# Patient Record
Sex: Female | Born: 1935
Health system: Southern US, Community
[De-identification: ages and names within clinical notes are randomized; demographics above are authoritative.]

## PROBLEM LIST (undated history)

## (undated) DIAGNOSIS — E039 Hypothyroidism, unspecified: Secondary | ICD-10-CM

## (undated) DIAGNOSIS — H3530 Unspecified macular degeneration: Secondary | ICD-10-CM

## (undated) DIAGNOSIS — I10 Essential (primary) hypertension: Secondary | ICD-10-CM

## (undated) DIAGNOSIS — I771 Stricture of artery: Secondary | ICD-10-CM

## (undated) DIAGNOSIS — M1990 Unspecified osteoarthritis, unspecified site: Secondary | ICD-10-CM

## (undated) DIAGNOSIS — R519 Headache, unspecified: Secondary | ICD-10-CM

## (undated) DIAGNOSIS — F419 Anxiety disorder, unspecified: Secondary | ICD-10-CM

## (undated) HISTORY — DX: Stricture of artery: I77.1

## (undated) HISTORY — PX: ABDOMINAL HYSTERECTOMY: SHX81

## (undated) HISTORY — PX: TONSILLECTOMY: SUR1361

---

## 1998-04-26 ENCOUNTER — Other Ambulatory Visit: Admission: RE | Admit: 1998-04-26 | Discharge: 1998-04-26 | Payer: Self-pay | Admitting: *Deleted

## 1999-07-11 ENCOUNTER — Other Ambulatory Visit: Admission: RE | Admit: 1999-07-11 | Discharge: 1999-07-11 | Payer: Self-pay | Admitting: *Deleted

## 2000-10-07 ENCOUNTER — Other Ambulatory Visit: Admission: RE | Admit: 2000-10-07 | Discharge: 2000-10-07 | Payer: Self-pay | Admitting: *Deleted

## 2011-06-11 DIAGNOSIS — R229 Localized swelling, mass and lump, unspecified: Secondary | ICD-10-CM | POA: Diagnosis not present

## 2011-06-14 DIAGNOSIS — R29898 Other symptoms and signs involving the musculoskeletal system: Secondary | ICD-10-CM | POA: Diagnosis not present

## 2011-10-21 DIAGNOSIS — E038 Other specified hypothyroidism: Secondary | ICD-10-CM | POA: Diagnosis not present

## 2011-10-21 DIAGNOSIS — E538 Deficiency of other specified B group vitamins: Secondary | ICD-10-CM | POA: Diagnosis not present

## 2011-10-21 DIAGNOSIS — I10 Essential (primary) hypertension: Secondary | ICD-10-CM | POA: Diagnosis not present

## 2011-10-21 DIAGNOSIS — E785 Hyperlipidemia, unspecified: Secondary | ICD-10-CM | POA: Diagnosis not present

## 2011-12-11 DIAGNOSIS — R319 Hematuria, unspecified: Secondary | ICD-10-CM | POA: Diagnosis not present

## 2011-12-11 DIAGNOSIS — N390 Urinary tract infection, site not specified: Secondary | ICD-10-CM | POA: Diagnosis not present

## 2011-12-11 DIAGNOSIS — R300 Dysuria: Secondary | ICD-10-CM | POA: Diagnosis not present

## 2011-12-27 DIAGNOSIS — R319 Hematuria, unspecified: Secondary | ICD-10-CM | POA: Diagnosis not present

## 2011-12-27 DIAGNOSIS — R300 Dysuria: Secondary | ICD-10-CM | POA: Diagnosis not present

## 2011-12-27 DIAGNOSIS — N390 Urinary tract infection, site not specified: Secondary | ICD-10-CM | POA: Diagnosis not present

## 2012-01-08 DIAGNOSIS — R300 Dysuria: Secondary | ICD-10-CM | POA: Diagnosis not present

## 2012-01-08 DIAGNOSIS — N318 Other neuromuscular dysfunction of bladder: Secondary | ICD-10-CM | POA: Diagnosis not present

## 2012-01-27 DIAGNOSIS — R310 Gross hematuria: Secondary | ICD-10-CM | POA: Insufficient documentation

## 2012-01-27 DIAGNOSIS — N3020 Other chronic cystitis without hematuria: Secondary | ICD-10-CM | POA: Diagnosis not present

## 2012-01-27 DIAGNOSIS — N3946 Mixed incontinence: Secondary | ICD-10-CM | POA: Insufficient documentation

## 2012-01-27 DIAGNOSIS — R300 Dysuria: Secondary | ICD-10-CM | POA: Diagnosis not present

## 2012-01-31 DIAGNOSIS — N390 Urinary tract infection, site not specified: Secondary | ICD-10-CM | POA: Diagnosis not present

## 2012-02-13 DIAGNOSIS — R310 Gross hematuria: Secondary | ICD-10-CM | POA: Diagnosis not present

## 2012-02-13 DIAGNOSIS — R319 Hematuria, unspecified: Secondary | ICD-10-CM | POA: Diagnosis not present

## 2012-02-24 DIAGNOSIS — G8929 Other chronic pain: Secondary | ICD-10-CM | POA: Insufficient documentation

## 2012-02-24 DIAGNOSIS — N949 Unspecified condition associated with female genital organs and menstrual cycle: Secondary | ICD-10-CM | POA: Diagnosis not present

## 2012-02-24 DIAGNOSIS — R102 Pelvic and perineal pain unspecified side: Secondary | ICD-10-CM | POA: Insufficient documentation

## 2012-02-24 DIAGNOSIS — N3946 Mixed incontinence: Secondary | ICD-10-CM | POA: Diagnosis not present

## 2012-03-02 DIAGNOSIS — N390 Urinary tract infection, site not specified: Secondary | ICD-10-CM | POA: Diagnosis not present

## 2012-03-02 DIAGNOSIS — N3946 Mixed incontinence: Secondary | ICD-10-CM | POA: Diagnosis not present

## 2012-04-08 DIAGNOSIS — Z23 Encounter for immunization: Secondary | ICD-10-CM | POA: Diagnosis not present

## 2012-04-13 DIAGNOSIS — N320 Bladder-neck obstruction: Secondary | ICD-10-CM | POA: Diagnosis not present

## 2012-04-13 DIAGNOSIS — N3941 Urge incontinence: Secondary | ICD-10-CM | POA: Diagnosis not present

## 2012-04-13 DIAGNOSIS — N3946 Mixed incontinence: Secondary | ICD-10-CM | POA: Diagnosis not present

## 2012-04-13 DIAGNOSIS — N393 Stress incontinence (female) (male): Secondary | ICD-10-CM | POA: Diagnosis not present

## 2012-06-04 DIAGNOSIS — E785 Hyperlipidemia, unspecified: Secondary | ICD-10-CM | POA: Diagnosis present

## 2012-06-04 DIAGNOSIS — F32A Depression, unspecified: Secondary | ICD-10-CM | POA: Diagnosis present

## 2012-06-04 DIAGNOSIS — Z01810 Encounter for preprocedural cardiovascular examination: Secondary | ICD-10-CM | POA: Diagnosis not present

## 2012-06-04 DIAGNOSIS — K219 Gastro-esophageal reflux disease without esophagitis: Secondary | ICD-10-CM | POA: Diagnosis present

## 2012-06-04 DIAGNOSIS — J309 Allergic rhinitis, unspecified: Secondary | ICD-10-CM | POA: Insufficient documentation

## 2012-06-04 DIAGNOSIS — I498 Other specified cardiac arrhythmias: Secondary | ICD-10-CM | POA: Diagnosis not present

## 2012-06-09 DIAGNOSIS — Z90710 Acquired absence of both cervix and uterus: Secondary | ICD-10-CM | POA: Diagnosis not present

## 2012-06-09 DIAGNOSIS — J309 Allergic rhinitis, unspecified: Secondary | ICD-10-CM | POA: Diagnosis not present

## 2012-06-09 DIAGNOSIS — Z880 Allergy status to penicillin: Secondary | ICD-10-CM | POA: Diagnosis not present

## 2012-06-09 DIAGNOSIS — R300 Dysuria: Secondary | ICD-10-CM | POA: Diagnosis not present

## 2012-06-09 DIAGNOSIS — R3915 Urgency of urination: Secondary | ICD-10-CM | POA: Diagnosis not present

## 2012-06-09 DIAGNOSIS — N3946 Mixed incontinence: Secondary | ICD-10-CM | POA: Diagnosis not present

## 2012-06-09 DIAGNOSIS — N949 Unspecified condition associated with female genital organs and menstrual cycle: Secondary | ICD-10-CM | POA: Diagnosis not present

## 2012-06-09 DIAGNOSIS — N320 Bladder-neck obstruction: Secondary | ICD-10-CM | POA: Diagnosis not present

## 2012-06-09 DIAGNOSIS — N8111 Cystocele, midline: Secondary | ICD-10-CM | POA: Diagnosis not present

## 2012-06-09 DIAGNOSIS — Z885 Allergy status to narcotic agent status: Secondary | ICD-10-CM | POA: Diagnosis not present

## 2012-06-09 DIAGNOSIS — I10 Essential (primary) hypertension: Secondary | ICD-10-CM | POA: Diagnosis not present

## 2012-06-09 DIAGNOSIS — Z7902 Long term (current) use of antithrombotics/antiplatelets: Secondary | ICD-10-CM | POA: Diagnosis not present

## 2012-06-09 DIAGNOSIS — R350 Frequency of micturition: Secondary | ICD-10-CM | POA: Diagnosis not present

## 2012-06-09 DIAGNOSIS — Z882 Allergy status to sulfonamides status: Secondary | ICD-10-CM | POA: Diagnosis not present

## 2012-06-09 DIAGNOSIS — G8929 Other chronic pain: Secondary | ICD-10-CM | POA: Diagnosis not present

## 2012-06-09 DIAGNOSIS — F411 Generalized anxiety disorder: Secondary | ICD-10-CM | POA: Diagnosis not present

## 2012-06-09 DIAGNOSIS — Z9851 Tubal ligation status: Secondary | ICD-10-CM | POA: Diagnosis not present

## 2012-06-09 DIAGNOSIS — R32 Unspecified urinary incontinence: Secondary | ICD-10-CM | POA: Diagnosis not present

## 2012-06-09 DIAGNOSIS — Z9889 Other specified postprocedural states: Secondary | ICD-10-CM | POA: Diagnosis not present

## 2012-06-09 DIAGNOSIS — F329 Major depressive disorder, single episode, unspecified: Secondary | ICD-10-CM | POA: Diagnosis not present

## 2012-06-09 DIAGNOSIS — E785 Hyperlipidemia, unspecified: Secondary | ICD-10-CM | POA: Diagnosis not present

## 2012-06-09 DIAGNOSIS — K219 Gastro-esophageal reflux disease without esophagitis: Secondary | ICD-10-CM | POA: Diagnosis not present

## 2012-06-09 DIAGNOSIS — Z87440 Personal history of urinary (tract) infections: Secondary | ICD-10-CM | POA: Diagnosis not present

## 2012-06-09 DIAGNOSIS — Z9849 Cataract extraction status, unspecified eye: Secondary | ICD-10-CM | POA: Diagnosis not present

## 2012-06-09 DIAGNOSIS — R310 Gross hematuria: Secondary | ICD-10-CM | POA: Diagnosis not present

## 2012-06-09 DIAGNOSIS — N3020 Other chronic cystitis without hematuria: Secondary | ICD-10-CM | POA: Diagnosis not present

## 2012-06-10 DIAGNOSIS — R350 Frequency of micturition: Secondary | ICD-10-CM | POA: Diagnosis not present

## 2012-06-10 DIAGNOSIS — N8111 Cystocele, midline: Secondary | ICD-10-CM | POA: Diagnosis not present

## 2012-06-10 DIAGNOSIS — N3946 Mixed incontinence: Secondary | ICD-10-CM | POA: Diagnosis not present

## 2012-06-10 DIAGNOSIS — R3915 Urgency of urination: Secondary | ICD-10-CM | POA: Diagnosis not present

## 2012-06-10 DIAGNOSIS — I10 Essential (primary) hypertension: Secondary | ICD-10-CM | POA: Diagnosis not present

## 2012-06-10 DIAGNOSIS — N320 Bladder-neck obstruction: Secondary | ICD-10-CM | POA: Diagnosis not present

## 2012-06-19 DIAGNOSIS — R300 Dysuria: Secondary | ICD-10-CM | POA: Diagnosis not present

## 2012-06-19 DIAGNOSIS — N320 Bladder-neck obstruction: Secondary | ICD-10-CM | POA: Diagnosis not present

## 2012-06-19 DIAGNOSIS — N949 Unspecified condition associated with female genital organs and menstrual cycle: Secondary | ICD-10-CM | POA: Diagnosis not present

## 2012-08-20 DIAGNOSIS — E785 Hyperlipidemia, unspecified: Secondary | ICD-10-CM | POA: Diagnosis not present

## 2012-08-20 DIAGNOSIS — I10 Essential (primary) hypertension: Secondary | ICD-10-CM | POA: Diagnosis not present

## 2012-08-20 DIAGNOSIS — Z6830 Body mass index (BMI) 30.0-30.9, adult: Secondary | ICD-10-CM | POA: Diagnosis not present

## 2012-08-20 DIAGNOSIS — E538 Deficiency of other specified B group vitamins: Secondary | ICD-10-CM | POA: Diagnosis not present

## 2012-08-20 DIAGNOSIS — E038 Other specified hypothyroidism: Secondary | ICD-10-CM | POA: Diagnosis not present

## 2012-09-14 DIAGNOSIS — D2370 Other benign neoplasm of skin of unspecified lower limb, including hip: Secondary | ICD-10-CM | POA: Diagnosis not present

## 2012-09-14 DIAGNOSIS — L719 Rosacea, unspecified: Secondary | ICD-10-CM | POA: Diagnosis not present

## 2012-09-14 DIAGNOSIS — L259 Unspecified contact dermatitis, unspecified cause: Secondary | ICD-10-CM | POA: Diagnosis not present

## 2012-09-15 DIAGNOSIS — M79609 Pain in unspecified limb: Secondary | ICD-10-CM | POA: Diagnosis not present

## 2012-09-15 DIAGNOSIS — R937 Abnormal findings on diagnostic imaging of other parts of musculoskeletal system: Secondary | ICD-10-CM | POA: Diagnosis not present

## 2012-09-30 DIAGNOSIS — R350 Frequency of micturition: Secondary | ICD-10-CM | POA: Diagnosis not present

## 2012-09-30 DIAGNOSIS — N320 Bladder-neck obstruction: Secondary | ICD-10-CM | POA: Diagnosis not present

## 2012-10-19 DIAGNOSIS — H04129 Dry eye syndrome of unspecified lacrimal gland: Secondary | ICD-10-CM | POA: Diagnosis not present

## 2012-10-19 DIAGNOSIS — H26499 Other secondary cataract, unspecified eye: Secondary | ICD-10-CM | POA: Diagnosis not present

## 2012-12-25 DIAGNOSIS — M25519 Pain in unspecified shoulder: Secondary | ICD-10-CM | POA: Diagnosis not present

## 2013-01-11 DIAGNOSIS — M719 Bursopathy, unspecified: Secondary | ICD-10-CM | POA: Diagnosis not present

## 2013-01-26 DIAGNOSIS — B370 Candidal stomatitis: Secondary | ICD-10-CM | POA: Diagnosis not present

## 2013-01-26 DIAGNOSIS — M25519 Pain in unspecified shoulder: Secondary | ICD-10-CM | POA: Diagnosis not present

## 2013-03-08 DIAGNOSIS — Z23 Encounter for immunization: Secondary | ICD-10-CM | POA: Diagnosis not present

## 2013-03-16 DIAGNOSIS — L821 Other seborrheic keratosis: Secondary | ICD-10-CM | POA: Diagnosis not present

## 2013-03-16 DIAGNOSIS — L820 Inflamed seborrheic keratosis: Secondary | ICD-10-CM | POA: Diagnosis not present

## 2013-03-16 DIAGNOSIS — D485 Neoplasm of uncertain behavior of skin: Secondary | ICD-10-CM | POA: Diagnosis not present

## 2013-03-16 DIAGNOSIS — B079 Viral wart, unspecified: Secondary | ICD-10-CM | POA: Diagnosis not present

## 2013-05-17 DIAGNOSIS — I10 Essential (primary) hypertension: Secondary | ICD-10-CM | POA: Diagnosis not present

## 2013-05-17 DIAGNOSIS — L259 Unspecified contact dermatitis, unspecified cause: Secondary | ICD-10-CM | POA: Diagnosis not present

## 2013-05-17 DIAGNOSIS — E785 Hyperlipidemia, unspecified: Secondary | ICD-10-CM | POA: Diagnosis not present

## 2013-05-17 DIAGNOSIS — E538 Deficiency of other specified B group vitamins: Secondary | ICD-10-CM | POA: Diagnosis not present

## 2013-05-19 DIAGNOSIS — I10 Essential (primary) hypertension: Secondary | ICD-10-CM | POA: Diagnosis not present

## 2013-05-19 DIAGNOSIS — E538 Deficiency of other specified B group vitamins: Secondary | ICD-10-CM | POA: Diagnosis not present

## 2013-05-19 DIAGNOSIS — E038 Other specified hypothyroidism: Secondary | ICD-10-CM | POA: Diagnosis not present

## 2013-05-19 DIAGNOSIS — E785 Hyperlipidemia, unspecified: Secondary | ICD-10-CM | POA: Diagnosis not present

## 2013-07-14 DIAGNOSIS — R059 Cough, unspecified: Secondary | ICD-10-CM | POA: Diagnosis not present

## 2013-07-14 DIAGNOSIS — R05 Cough: Secondary | ICD-10-CM | POA: Diagnosis not present

## 2013-07-14 DIAGNOSIS — Z6830 Body mass index (BMI) 30.0-30.9, adult: Secondary | ICD-10-CM | POA: Diagnosis not present

## 2013-07-14 DIAGNOSIS — J069 Acute upper respiratory infection, unspecified: Secondary | ICD-10-CM | POA: Diagnosis not present

## 2013-10-04 DIAGNOSIS — B029 Zoster without complications: Secondary | ICD-10-CM | POA: Diagnosis not present

## 2013-10-04 DIAGNOSIS — Z79899 Other long term (current) drug therapy: Secondary | ICD-10-CM | POA: Diagnosis not present

## 2013-10-20 DIAGNOSIS — M25519 Pain in unspecified shoulder: Secondary | ICD-10-CM | POA: Diagnosis not present

## 2013-11-02 DIAGNOSIS — M25819 Other specified joint disorders, unspecified shoulder: Secondary | ICD-10-CM | POA: Diagnosis not present

## 2013-11-09 DIAGNOSIS — M25819 Other specified joint disorders, unspecified shoulder: Secondary | ICD-10-CM | POA: Diagnosis not present

## 2013-11-15 DIAGNOSIS — I10 Essential (primary) hypertension: Secondary | ICD-10-CM | POA: Diagnosis not present

## 2013-11-15 DIAGNOSIS — R5383 Other fatigue: Secondary | ICD-10-CM | POA: Diagnosis not present

## 2013-11-15 DIAGNOSIS — Z79899 Other long term (current) drug therapy: Secondary | ICD-10-CM | POA: Diagnosis not present

## 2013-11-15 DIAGNOSIS — E785 Hyperlipidemia, unspecified: Secondary | ICD-10-CM | POA: Diagnosis not present

## 2013-11-15 DIAGNOSIS — R5381 Other malaise: Secondary | ICD-10-CM | POA: Diagnosis not present

## 2013-11-15 DIAGNOSIS — E538 Deficiency of other specified B group vitamins: Secondary | ICD-10-CM | POA: Diagnosis not present

## 2013-11-16 DIAGNOSIS — M25819 Other specified joint disorders, unspecified shoulder: Secondary | ICD-10-CM | POA: Diagnosis not present

## 2013-11-18 DIAGNOSIS — M25819 Other specified joint disorders, unspecified shoulder: Secondary | ICD-10-CM | POA: Diagnosis not present

## 2013-11-23 DIAGNOSIS — M25819 Other specified joint disorders, unspecified shoulder: Secondary | ICD-10-CM | POA: Diagnosis not present

## 2013-11-25 DIAGNOSIS — M25819 Other specified joint disorders, unspecified shoulder: Secondary | ICD-10-CM | POA: Diagnosis not present

## 2013-12-02 DIAGNOSIS — M25819 Other specified joint disorders, unspecified shoulder: Secondary | ICD-10-CM | POA: Diagnosis not present

## 2013-12-09 DIAGNOSIS — M25819 Other specified joint disorders, unspecified shoulder: Secondary | ICD-10-CM | POA: Diagnosis not present

## 2013-12-16 DIAGNOSIS — M25819 Other specified joint disorders, unspecified shoulder: Secondary | ICD-10-CM | POA: Diagnosis not present

## 2014-03-07 DIAGNOSIS — L821 Other seborrheic keratosis: Secondary | ICD-10-CM | POA: Diagnosis not present

## 2014-03-07 DIAGNOSIS — L723 Sebaceous cyst: Secondary | ICD-10-CM | POA: Diagnosis not present

## 2014-03-07 DIAGNOSIS — D1801 Hemangioma of skin and subcutaneous tissue: Secondary | ICD-10-CM | POA: Diagnosis not present

## 2014-03-07 DIAGNOSIS — D229 Melanocytic nevi, unspecified: Secondary | ICD-10-CM | POA: Diagnosis not present

## 2014-04-21 DIAGNOSIS — Z23 Encounter for immunization: Secondary | ICD-10-CM | POA: Diagnosis not present

## 2014-05-30 DIAGNOSIS — G44209 Tension-type headache, unspecified, not intractable: Secondary | ICD-10-CM | POA: Diagnosis not present

## 2014-06-01 DIAGNOSIS — H04123 Dry eye syndrome of bilateral lacrimal glands: Secondary | ICD-10-CM | POA: Diagnosis not present

## 2014-06-01 DIAGNOSIS — H26493 Other secondary cataract, bilateral: Secondary | ICD-10-CM | POA: Diagnosis not present

## 2014-09-01 DIAGNOSIS — Z6830 Body mass index (BMI) 30.0-30.9, adult: Secondary | ICD-10-CM | POA: Diagnosis not present

## 2014-09-01 DIAGNOSIS — F329 Major depressive disorder, single episode, unspecified: Secondary | ICD-10-CM | POA: Diagnosis not present

## 2014-09-01 DIAGNOSIS — J45909 Unspecified asthma, uncomplicated: Secondary | ICD-10-CM | POA: Diagnosis not present

## 2015-01-11 DIAGNOSIS — E785 Hyperlipidemia, unspecified: Secondary | ICD-10-CM | POA: Diagnosis not present

## 2015-01-11 DIAGNOSIS — F329 Major depressive disorder, single episode, unspecified: Secondary | ICD-10-CM | POA: Diagnosis not present

## 2015-01-11 DIAGNOSIS — Z9181 History of falling: Secondary | ICD-10-CM | POA: Diagnosis not present

## 2015-01-11 DIAGNOSIS — Z6830 Body mass index (BMI) 30.0-30.9, adult: Secondary | ICD-10-CM | POA: Diagnosis not present

## 2015-01-11 DIAGNOSIS — E038 Other specified hypothyroidism: Secondary | ICD-10-CM | POA: Diagnosis not present

## 2015-01-11 DIAGNOSIS — J45909 Unspecified asthma, uncomplicated: Secondary | ICD-10-CM | POA: Diagnosis not present

## 2015-01-11 DIAGNOSIS — I10 Essential (primary) hypertension: Secondary | ICD-10-CM | POA: Diagnosis not present

## 2015-01-11 DIAGNOSIS — Z23 Encounter for immunization: Secondary | ICD-10-CM | POA: Diagnosis not present

## 2015-03-26 DIAGNOSIS — Z23 Encounter for immunization: Secondary | ICD-10-CM | POA: Diagnosis not present

## 2015-03-30 DIAGNOSIS — R1030 Lower abdominal pain, unspecified: Secondary | ICD-10-CM | POA: Diagnosis not present

## 2015-03-30 DIAGNOSIS — Z6830 Body mass index (BMI) 30.0-30.9, adult: Secondary | ICD-10-CM | POA: Diagnosis not present

## 2015-03-30 DIAGNOSIS — Z79899 Other long term (current) drug therapy: Secondary | ICD-10-CM | POA: Diagnosis not present

## 2015-04-04 DIAGNOSIS — R1030 Lower abdominal pain, unspecified: Secondary | ICD-10-CM | POA: Diagnosis not present

## 2015-04-05 DIAGNOSIS — L821 Other seborrheic keratosis: Secondary | ICD-10-CM | POA: Diagnosis not present

## 2015-04-05 DIAGNOSIS — T63421A Toxic effect of venom of ants, accidental (unintentional), initial encounter: Secondary | ICD-10-CM | POA: Diagnosis not present

## 2015-04-05 DIAGNOSIS — D225 Melanocytic nevi of trunk: Secondary | ICD-10-CM | POA: Diagnosis not present

## 2015-04-05 DIAGNOSIS — L723 Sebaceous cyst: Secondary | ICD-10-CM | POA: Diagnosis not present

## 2015-04-10 DIAGNOSIS — R1030 Lower abdominal pain, unspecified: Secondary | ICD-10-CM | POA: Diagnosis not present

## 2015-04-10 DIAGNOSIS — Z0000 Encounter for general adult medical examination without abnormal findings: Secondary | ICD-10-CM | POA: Diagnosis not present

## 2015-04-13 DIAGNOSIS — R102 Pelvic and perineal pain: Secondary | ICD-10-CM | POA: Diagnosis not present

## 2015-04-13 DIAGNOSIS — M25552 Pain in left hip: Secondary | ICD-10-CM | POA: Diagnosis not present

## 2015-04-13 DIAGNOSIS — M160 Bilateral primary osteoarthritis of hip: Secondary | ICD-10-CM | POA: Diagnosis not present

## 2015-05-15 DIAGNOSIS — M25511 Pain in right shoulder: Secondary | ICD-10-CM | POA: Diagnosis not present

## 2015-05-15 DIAGNOSIS — M19011 Primary osteoarthritis, right shoulder: Secondary | ICD-10-CM | POA: Diagnosis not present

## 2015-08-11 DIAGNOSIS — R109 Unspecified abdominal pain: Secondary | ICD-10-CM | POA: Diagnosis not present

## 2015-08-11 DIAGNOSIS — R1012 Left upper quadrant pain: Secondary | ICD-10-CM | POA: Diagnosis not present

## 2015-08-11 DIAGNOSIS — K2900 Acute gastritis without bleeding: Secondary | ICD-10-CM | POA: Diagnosis not present

## 2015-08-21 DIAGNOSIS — E669 Obesity, unspecified: Secondary | ICD-10-CM | POA: Diagnosis not present

## 2015-08-21 DIAGNOSIS — E038 Other specified hypothyroidism: Secondary | ICD-10-CM | POA: Diagnosis not present

## 2015-08-21 DIAGNOSIS — F411 Generalized anxiety disorder: Secondary | ICD-10-CM | POA: Diagnosis not present

## 2015-08-21 DIAGNOSIS — F329 Major depressive disorder, single episode, unspecified: Secondary | ICD-10-CM | POA: Diagnosis not present

## 2015-08-21 DIAGNOSIS — Z1389 Encounter for screening for other disorder: Secondary | ICD-10-CM | POA: Diagnosis not present

## 2015-08-21 DIAGNOSIS — I10 Essential (primary) hypertension: Secondary | ICD-10-CM | POA: Diagnosis not present

## 2015-08-21 DIAGNOSIS — Z6830 Body mass index (BMI) 30.0-30.9, adult: Secondary | ICD-10-CM | POA: Diagnosis not present

## 2015-08-21 DIAGNOSIS — E785 Hyperlipidemia, unspecified: Secondary | ICD-10-CM | POA: Diagnosis not present

## 2015-08-21 DIAGNOSIS — Z79899 Other long term (current) drug therapy: Secondary | ICD-10-CM | POA: Diagnosis not present

## 2015-08-30 DIAGNOSIS — M19011 Primary osteoarthritis, right shoulder: Secondary | ICD-10-CM | POA: Diagnosis not present

## 2015-08-30 DIAGNOSIS — M25511 Pain in right shoulder: Secondary | ICD-10-CM | POA: Diagnosis not present

## 2015-09-19 DIAGNOSIS — I10 Essential (primary) hypertension: Secondary | ICD-10-CM | POA: Diagnosis not present

## 2015-09-19 DIAGNOSIS — Z6827 Body mass index (BMI) 27.0-27.9, adult: Secondary | ICD-10-CM | POA: Diagnosis not present

## 2015-09-19 DIAGNOSIS — Z79899 Other long term (current) drug therapy: Secondary | ICD-10-CM | POA: Diagnosis not present

## 2015-11-28 DIAGNOSIS — L820 Inflamed seborrheic keratosis: Secondary | ICD-10-CM | POA: Diagnosis not present

## 2015-11-28 DIAGNOSIS — D1801 Hemangioma of skin and subcutaneous tissue: Secondary | ICD-10-CM | POA: Diagnosis not present

## 2015-11-28 DIAGNOSIS — L853 Xerosis cutis: Secondary | ICD-10-CM | POA: Diagnosis not present

## 2015-12-10 DIAGNOSIS — S60465A Insect bite (nonvenomous) of left ring finger, initial encounter: Secondary | ICD-10-CM | POA: Diagnosis not present

## 2015-12-13 DIAGNOSIS — Z79899 Other long term (current) drug therapy: Secondary | ICD-10-CM | POA: Diagnosis not present

## 2015-12-13 DIAGNOSIS — F329 Major depressive disorder, single episode, unspecified: Secondary | ICD-10-CM | POA: Diagnosis not present

## 2015-12-13 DIAGNOSIS — Z6827 Body mass index (BMI) 27.0-27.9, adult: Secondary | ICD-10-CM | POA: Diagnosis not present

## 2015-12-13 DIAGNOSIS — E038 Other specified hypothyroidism: Secondary | ICD-10-CM | POA: Diagnosis not present

## 2015-12-13 DIAGNOSIS — I10 Essential (primary) hypertension: Secondary | ICD-10-CM | POA: Diagnosis not present

## 2015-12-13 DIAGNOSIS — M159 Polyosteoarthritis, unspecified: Secondary | ICD-10-CM | POA: Diagnosis not present

## 2015-12-13 DIAGNOSIS — E785 Hyperlipidemia, unspecified: Secondary | ICD-10-CM | POA: Diagnosis not present

## 2015-12-27 DIAGNOSIS — M19011 Primary osteoarthritis, right shoulder: Secondary | ICD-10-CM | POA: Diagnosis not present

## 2015-12-27 DIAGNOSIS — M25511 Pain in right shoulder: Secondary | ICD-10-CM | POA: Diagnosis not present

## 2016-02-07 DIAGNOSIS — Z6830 Body mass index (BMI) 30.0-30.9, adult: Secondary | ICD-10-CM | POA: Diagnosis not present

## 2016-02-07 DIAGNOSIS — R350 Frequency of micturition: Secondary | ICD-10-CM | POA: Diagnosis not present

## 2016-02-07 DIAGNOSIS — N3281 Overactive bladder: Secondary | ICD-10-CM | POA: Diagnosis not present

## 2016-02-07 DIAGNOSIS — Z9181 History of falling: Secondary | ICD-10-CM | POA: Diagnosis not present

## 2016-03-19 DIAGNOSIS — H26493 Other secondary cataract, bilateral: Secondary | ICD-10-CM | POA: Diagnosis not present

## 2016-03-19 DIAGNOSIS — G43109 Migraine with aura, not intractable, without status migrainosus: Secondary | ICD-10-CM | POA: Diagnosis not present

## 2016-03-19 DIAGNOSIS — H04123 Dry eye syndrome of bilateral lacrimal glands: Secondary | ICD-10-CM | POA: Diagnosis not present

## 2016-04-04 DIAGNOSIS — Z23 Encounter for immunization: Secondary | ICD-10-CM | POA: Diagnosis not present

## 2016-05-23 DIAGNOSIS — Z6830 Body mass index (BMI) 30.0-30.9, adult: Secondary | ICD-10-CM | POA: Diagnosis not present

## 2016-05-23 DIAGNOSIS — M79605 Pain in left leg: Secondary | ICD-10-CM | POA: Diagnosis not present

## 2016-05-30 DIAGNOSIS — M79605 Pain in left leg: Secondary | ICD-10-CM | POA: Diagnosis not present

## 2016-05-30 DIAGNOSIS — M79662 Pain in left lower leg: Secondary | ICD-10-CM | POA: Diagnosis not present

## 2016-06-17 DIAGNOSIS — M19011 Primary osteoarthritis, right shoulder: Secondary | ICD-10-CM | POA: Diagnosis not present

## 2016-06-17 DIAGNOSIS — M25511 Pain in right shoulder: Secondary | ICD-10-CM | POA: Diagnosis not present

## 2016-10-03 DIAGNOSIS — Z6830 Body mass index (BMI) 30.0-30.9, adult: Secondary | ICD-10-CM | POA: Diagnosis not present

## 2016-10-03 DIAGNOSIS — E785 Hyperlipidemia, unspecified: Secondary | ICD-10-CM | POA: Diagnosis not present

## 2016-10-03 DIAGNOSIS — E063 Autoimmune thyroiditis: Secondary | ICD-10-CM | POA: Diagnosis not present

## 2016-10-03 DIAGNOSIS — F411 Generalized anxiety disorder: Secondary | ICD-10-CM | POA: Diagnosis not present

## 2016-10-03 DIAGNOSIS — N3281 Overactive bladder: Secondary | ICD-10-CM | POA: Diagnosis not present

## 2016-10-03 DIAGNOSIS — F341 Dysthymic disorder: Secondary | ICD-10-CM | POA: Diagnosis not present

## 2016-10-03 DIAGNOSIS — I10 Essential (primary) hypertension: Secondary | ICD-10-CM | POA: Diagnosis not present

## 2016-10-03 DIAGNOSIS — Z79899 Other long term (current) drug therapy: Secondary | ICD-10-CM | POA: Diagnosis not present

## 2016-10-03 DIAGNOSIS — Z23 Encounter for immunization: Secondary | ICD-10-CM | POA: Diagnosis not present

## 2016-10-14 DIAGNOSIS — L700 Acne vulgaris: Secondary | ICD-10-CM | POA: Diagnosis not present

## 2016-10-14 DIAGNOSIS — L578 Other skin changes due to chronic exposure to nonionizing radiation: Secondary | ICD-10-CM | POA: Diagnosis not present

## 2016-10-21 DIAGNOSIS — E669 Obesity, unspecified: Secondary | ICD-10-CM | POA: Diagnosis not present

## 2016-10-21 DIAGNOSIS — M159 Polyosteoarthritis, unspecified: Secondary | ICD-10-CM | POA: Diagnosis not present

## 2016-10-21 DIAGNOSIS — Z79899 Other long term (current) drug therapy: Secondary | ICD-10-CM | POA: Diagnosis not present

## 2016-10-21 DIAGNOSIS — Z6830 Body mass index (BMI) 30.0-30.9, adult: Secondary | ICD-10-CM | POA: Diagnosis not present

## 2016-11-08 DIAGNOSIS — M25511 Pain in right shoulder: Secondary | ICD-10-CM | POA: Diagnosis not present

## 2017-01-22 DIAGNOSIS — Z0000 Encounter for general adult medical examination without abnormal findings: Secondary | ICD-10-CM | POA: Diagnosis not present

## 2017-01-22 DIAGNOSIS — Z136 Encounter for screening for cardiovascular disorders: Secondary | ICD-10-CM | POA: Diagnosis not present

## 2017-01-22 DIAGNOSIS — Z9181 History of falling: Secondary | ICD-10-CM | POA: Diagnosis not present

## 2017-01-22 DIAGNOSIS — Z6831 Body mass index (BMI) 31.0-31.9, adult: Secondary | ICD-10-CM | POA: Diagnosis not present

## 2017-01-22 DIAGNOSIS — E063 Autoimmune thyroiditis: Secondary | ICD-10-CM | POA: Diagnosis not present

## 2017-01-22 DIAGNOSIS — Z1231 Encounter for screening mammogram for malignant neoplasm of breast: Secondary | ICD-10-CM | POA: Diagnosis not present

## 2017-01-22 DIAGNOSIS — N959 Unspecified menopausal and perimenopausal disorder: Secondary | ICD-10-CM | POA: Diagnosis not present

## 2017-01-22 DIAGNOSIS — E669 Obesity, unspecified: Secondary | ICD-10-CM | POA: Diagnosis not present

## 2017-01-22 DIAGNOSIS — E785 Hyperlipidemia, unspecified: Secondary | ICD-10-CM | POA: Diagnosis not present

## 2017-01-22 DIAGNOSIS — Z1389 Encounter for screening for other disorder: Secondary | ICD-10-CM | POA: Diagnosis not present

## 2017-02-11 DIAGNOSIS — M19011 Primary osteoarthritis, right shoulder: Secondary | ICD-10-CM | POA: Diagnosis not present

## 2017-02-11 DIAGNOSIS — M25511 Pain in right shoulder: Secondary | ICD-10-CM | POA: Diagnosis not present

## 2017-02-25 DIAGNOSIS — N3941 Urge incontinence: Secondary | ICD-10-CM | POA: Diagnosis not present

## 2017-02-25 DIAGNOSIS — N390 Urinary tract infection, site not specified: Secondary | ICD-10-CM | POA: Diagnosis not present

## 2017-03-09 DIAGNOSIS — Z23 Encounter for immunization: Secondary | ICD-10-CM | POA: Diagnosis not present

## 2017-03-26 DIAGNOSIS — H26493 Other secondary cataract, bilateral: Secondary | ICD-10-CM | POA: Diagnosis not present

## 2017-04-11 DIAGNOSIS — M7061 Trochanteric bursitis, right hip: Secondary | ICD-10-CM | POA: Diagnosis not present

## 2017-04-17 DIAGNOSIS — R3989 Other symptoms and signs involving the genitourinary system: Secondary | ICD-10-CM | POA: Diagnosis not present

## 2017-04-17 DIAGNOSIS — E669 Obesity, unspecified: Secondary | ICD-10-CM | POA: Diagnosis not present

## 2017-05-02 DIAGNOSIS — R5381 Other malaise: Secondary | ICD-10-CM | POA: Diagnosis present

## 2017-05-02 DIAGNOSIS — Z9181 History of falling: Secondary | ICD-10-CM | POA: Diagnosis not present

## 2017-05-02 DIAGNOSIS — F418 Other specified anxiety disorders: Secondary | ICD-10-CM | POA: Diagnosis present

## 2017-05-02 DIAGNOSIS — I10 Essential (primary) hypertension: Secondary | ICD-10-CM | POA: Diagnosis not present

## 2017-05-02 DIAGNOSIS — R0602 Shortness of breath: Secondary | ICD-10-CM | POA: Diagnosis not present

## 2017-05-02 DIAGNOSIS — E861 Hypovolemia: Secondary | ICD-10-CM | POA: Diagnosis not present

## 2017-05-02 DIAGNOSIS — R509 Fever, unspecified: Secondary | ICD-10-CM | POA: Diagnosis not present

## 2017-05-02 DIAGNOSIS — F329 Major depressive disorder, single episode, unspecified: Secondary | ICD-10-CM | POA: Diagnosis not present

## 2017-05-02 DIAGNOSIS — B9620 Unspecified Escherichia coli [E. coli] as the cause of diseases classified elsewhere: Secondary | ICD-10-CM | POA: Diagnosis present

## 2017-05-02 DIAGNOSIS — E860 Dehydration: Secondary | ICD-10-CM | POA: Diagnosis not present

## 2017-05-02 DIAGNOSIS — M1712 Unilateral primary osteoarthritis, left knee: Secondary | ICD-10-CM | POA: Diagnosis not present

## 2017-05-02 DIAGNOSIS — E876 Hypokalemia: Secondary | ICD-10-CM | POA: Diagnosis not present

## 2017-05-02 DIAGNOSIS — G9349 Other encephalopathy: Secondary | ICD-10-CM | POA: Diagnosis not present

## 2017-05-02 DIAGNOSIS — R1110 Vomiting, unspecified: Secondary | ICD-10-CM | POA: Diagnosis not present

## 2017-05-02 DIAGNOSIS — R0902 Hypoxemia: Secondary | ICD-10-CM | POA: Diagnosis not present

## 2017-05-02 DIAGNOSIS — R197 Diarrhea, unspecified: Secondary | ICD-10-CM | POA: Diagnosis not present

## 2017-05-02 DIAGNOSIS — M25562 Pain in left knee: Secondary | ICD-10-CM | POA: Diagnosis present

## 2017-05-02 DIAGNOSIS — N10 Acute tubulo-interstitial nephritis: Secondary | ICD-10-CM | POA: Diagnosis not present

## 2017-05-02 DIAGNOSIS — R112 Nausea with vomiting, unspecified: Secondary | ICD-10-CM | POA: Diagnosis not present

## 2017-05-02 DIAGNOSIS — R531 Weakness: Secondary | ICD-10-CM | POA: Diagnosis not present

## 2017-05-02 DIAGNOSIS — A419 Sepsis, unspecified organism: Secondary | ICD-10-CM | POA: Diagnosis present

## 2017-05-02 DIAGNOSIS — R404 Transient alteration of awareness: Secondary | ICD-10-CM | POA: Diagnosis not present

## 2017-05-07 DIAGNOSIS — R0602 Shortness of breath: Secondary | ICD-10-CM

## 2017-05-09 DIAGNOSIS — Z791 Long term (current) use of non-steroidal anti-inflammatories (NSAID): Secondary | ICD-10-CM | POA: Diagnosis not present

## 2017-05-09 DIAGNOSIS — I10 Essential (primary) hypertension: Secondary | ICD-10-CM | POA: Diagnosis not present

## 2017-05-09 DIAGNOSIS — R296 Repeated falls: Secondary | ICD-10-CM | POA: Diagnosis not present

## 2017-05-09 DIAGNOSIS — F418 Other specified anxiety disorders: Secondary | ICD-10-CM | POA: Diagnosis not present

## 2017-05-09 DIAGNOSIS — B9620 Unspecified Escherichia coli [E. coli] as the cause of diseases classified elsewhere: Secondary | ICD-10-CM | POA: Diagnosis not present

## 2017-05-09 DIAGNOSIS — N10 Acute tubulo-interstitial nephritis: Secondary | ICD-10-CM | POA: Diagnosis not present

## 2017-05-09 DIAGNOSIS — Z602 Problems related to living alone: Secondary | ICD-10-CM | POA: Diagnosis not present

## 2017-05-09 DIAGNOSIS — M25562 Pain in left knee: Secondary | ICD-10-CM | POA: Diagnosis not present

## 2017-05-16 DIAGNOSIS — R296 Repeated falls: Secondary | ICD-10-CM | POA: Diagnosis not present

## 2017-05-16 DIAGNOSIS — F418 Other specified anxiety disorders: Secondary | ICD-10-CM | POA: Diagnosis not present

## 2017-05-16 DIAGNOSIS — N10 Acute tubulo-interstitial nephritis: Secondary | ICD-10-CM | POA: Diagnosis not present

## 2017-05-16 DIAGNOSIS — B9620 Unspecified Escherichia coli [E. coli] as the cause of diseases classified elsewhere: Secondary | ICD-10-CM | POA: Diagnosis not present

## 2017-05-16 DIAGNOSIS — I10 Essential (primary) hypertension: Secondary | ICD-10-CM | POA: Diagnosis not present

## 2017-05-16 DIAGNOSIS — M25562 Pain in left knee: Secondary | ICD-10-CM | POA: Diagnosis not present

## 2017-05-19 DIAGNOSIS — R296 Repeated falls: Secondary | ICD-10-CM | POA: Diagnosis not present

## 2017-05-19 DIAGNOSIS — F418 Other specified anxiety disorders: Secondary | ICD-10-CM | POA: Diagnosis not present

## 2017-05-19 DIAGNOSIS — B9620 Unspecified Escherichia coli [E. coli] as the cause of diseases classified elsewhere: Secondary | ICD-10-CM | POA: Diagnosis not present

## 2017-05-19 DIAGNOSIS — N10 Acute tubulo-interstitial nephritis: Secondary | ICD-10-CM | POA: Diagnosis not present

## 2017-05-19 DIAGNOSIS — M25562 Pain in left knee: Secondary | ICD-10-CM | POA: Diagnosis not present

## 2017-05-19 DIAGNOSIS — I10 Essential (primary) hypertension: Secondary | ICD-10-CM | POA: Diagnosis not present

## 2017-05-21 DIAGNOSIS — N10 Acute tubulo-interstitial nephritis: Secondary | ICD-10-CM | POA: Diagnosis not present

## 2017-05-21 DIAGNOSIS — F418 Other specified anxiety disorders: Secondary | ICD-10-CM | POA: Diagnosis not present

## 2017-05-21 DIAGNOSIS — M25562 Pain in left knee: Secondary | ICD-10-CM | POA: Diagnosis not present

## 2017-05-21 DIAGNOSIS — B9620 Unspecified Escherichia coli [E. coli] as the cause of diseases classified elsewhere: Secondary | ICD-10-CM | POA: Diagnosis not present

## 2017-05-21 DIAGNOSIS — R296 Repeated falls: Secondary | ICD-10-CM | POA: Diagnosis not present

## 2017-05-21 DIAGNOSIS — I10 Essential (primary) hypertension: Secondary | ICD-10-CM | POA: Diagnosis not present

## 2017-05-23 DIAGNOSIS — R296 Repeated falls: Secondary | ICD-10-CM | POA: Diagnosis not present

## 2017-05-23 DIAGNOSIS — N10 Acute tubulo-interstitial nephritis: Secondary | ICD-10-CM | POA: Diagnosis not present

## 2017-05-23 DIAGNOSIS — F418 Other specified anxiety disorders: Secondary | ICD-10-CM | POA: Diagnosis not present

## 2017-05-23 DIAGNOSIS — M25562 Pain in left knee: Secondary | ICD-10-CM | POA: Diagnosis not present

## 2017-05-23 DIAGNOSIS — I10 Essential (primary) hypertension: Secondary | ICD-10-CM | POA: Diagnosis not present

## 2017-05-23 DIAGNOSIS — B9620 Unspecified Escherichia coli [E. coli] as the cause of diseases classified elsewhere: Secondary | ICD-10-CM | POA: Diagnosis not present

## 2017-06-20 DIAGNOSIS — M25511 Pain in right shoulder: Secondary | ICD-10-CM | POA: Diagnosis not present

## 2017-06-20 DIAGNOSIS — M19011 Primary osteoarthritis, right shoulder: Secondary | ICD-10-CM | POA: Diagnosis not present

## 2017-06-30 DIAGNOSIS — M19012 Primary osteoarthritis, left shoulder: Secondary | ICD-10-CM | POA: Diagnosis not present

## 2017-06-30 DIAGNOSIS — M25512 Pain in left shoulder: Secondary | ICD-10-CM | POA: Diagnosis not present

## 2017-07-22 DIAGNOSIS — I10 Essential (primary) hypertension: Secondary | ICD-10-CM | POA: Diagnosis not present

## 2017-07-22 DIAGNOSIS — Z79899 Other long term (current) drug therapy: Secondary | ICD-10-CM | POA: Diagnosis not present

## 2017-07-22 DIAGNOSIS — E785 Hyperlipidemia, unspecified: Secondary | ICD-10-CM | POA: Diagnosis not present

## 2017-07-22 DIAGNOSIS — F411 Generalized anxiety disorder: Secondary | ICD-10-CM | POA: Diagnosis not present

## 2017-07-22 DIAGNOSIS — E063 Autoimmune thyroiditis: Secondary | ICD-10-CM | POA: Diagnosis not present

## 2017-08-01 DIAGNOSIS — M62838 Other muscle spasm: Secondary | ICD-10-CM | POA: Diagnosis not present

## 2017-08-20 DIAGNOSIS — N189 Chronic kidney disease, unspecified: Secondary | ICD-10-CM | POA: Diagnosis not present

## 2017-08-22 ENCOUNTER — Other Ambulatory Visit: Payer: Self-pay | Admitting: Nephrology

## 2017-08-22 DIAGNOSIS — N12 Tubulo-interstitial nephritis, not specified as acute or chronic: Secondary | ICD-10-CM

## 2017-08-27 DIAGNOSIS — N12 Tubulo-interstitial nephritis, not specified as acute or chronic: Secondary | ICD-10-CM | POA: Diagnosis not present

## 2017-10-06 DIAGNOSIS — M25512 Pain in left shoulder: Secondary | ICD-10-CM | POA: Diagnosis not present

## 2017-10-06 DIAGNOSIS — M19012 Primary osteoarthritis, left shoulder: Secondary | ICD-10-CM | POA: Diagnosis not present

## 2017-10-13 DIAGNOSIS — M19011 Primary osteoarthritis, right shoulder: Secondary | ICD-10-CM | POA: Diagnosis not present

## 2017-10-13 DIAGNOSIS — M25511 Pain in right shoulder: Secondary | ICD-10-CM | POA: Diagnosis not present

## 2017-10-29 DIAGNOSIS — R351 Nocturia: Secondary | ICD-10-CM | POA: Diagnosis not present

## 2017-10-29 DIAGNOSIS — N3941 Urge incontinence: Secondary | ICD-10-CM | POA: Diagnosis not present

## 2017-10-29 DIAGNOSIS — R350 Frequency of micturition: Secondary | ICD-10-CM | POA: Diagnosis not present

## 2017-11-27 DIAGNOSIS — S0006XA Insect bite (nonvenomous) of scalp, initial encounter: Secondary | ICD-10-CM | POA: Diagnosis not present

## 2017-12-12 DIAGNOSIS — N3941 Urge incontinence: Secondary | ICD-10-CM | POA: Diagnosis not present

## 2018-01-13 DIAGNOSIS — M19011 Primary osteoarthritis, right shoulder: Secondary | ICD-10-CM | POA: Diagnosis not present

## 2018-01-13 DIAGNOSIS — M25511 Pain in right shoulder: Secondary | ICD-10-CM | POA: Diagnosis not present

## 2018-01-20 DIAGNOSIS — N3941 Urge incontinence: Secondary | ICD-10-CM | POA: Diagnosis not present

## 2018-02-05 DIAGNOSIS — I10 Essential (primary) hypertension: Secondary | ICD-10-CM | POA: Diagnosis not present

## 2018-02-05 DIAGNOSIS — E785 Hyperlipidemia, unspecified: Secondary | ICD-10-CM | POA: Diagnosis not present

## 2018-02-05 DIAGNOSIS — E063 Autoimmune thyroiditis: Secondary | ICD-10-CM | POA: Diagnosis not present

## 2018-02-05 DIAGNOSIS — Z1339 Encounter for screening examination for other mental health and behavioral disorders: Secondary | ICD-10-CM | POA: Diagnosis not present

## 2018-02-05 DIAGNOSIS — M159 Polyosteoarthritis, unspecified: Secondary | ICD-10-CM | POA: Diagnosis not present

## 2018-02-24 DIAGNOSIS — R300 Dysuria: Secondary | ICD-10-CM | POA: Diagnosis not present

## 2018-02-24 DIAGNOSIS — N3941 Urge incontinence: Secondary | ICD-10-CM | POA: Diagnosis not present

## 2018-03-13 DIAGNOSIS — N390 Urinary tract infection, site not specified: Secondary | ICD-10-CM | POA: Diagnosis not present

## 2018-03-13 DIAGNOSIS — N3941 Urge incontinence: Secondary | ICD-10-CM | POA: Diagnosis not present

## 2018-03-23 DIAGNOSIS — N3941 Urge incontinence: Secondary | ICD-10-CM | POA: Diagnosis not present

## 2018-03-25 DIAGNOSIS — M19012 Primary osteoarthritis, left shoulder: Secondary | ICD-10-CM | POA: Diagnosis not present

## 2018-03-25 DIAGNOSIS — M25512 Pain in left shoulder: Secondary | ICD-10-CM | POA: Diagnosis not present

## 2018-03-26 DIAGNOSIS — Z23 Encounter for immunization: Secondary | ICD-10-CM | POA: Diagnosis not present

## 2018-04-06 DIAGNOSIS — L578 Other skin changes due to chronic exposure to nonionizing radiation: Secondary | ICD-10-CM | POA: Diagnosis not present

## 2018-04-06 DIAGNOSIS — L820 Inflamed seborrheic keratosis: Secondary | ICD-10-CM | POA: Diagnosis not present

## 2018-04-06 DIAGNOSIS — C44629 Squamous cell carcinoma of skin of left upper limb, including shoulder: Secondary | ICD-10-CM | POA: Diagnosis not present

## 2018-04-06 DIAGNOSIS — L821 Other seborrheic keratosis: Secondary | ICD-10-CM | POA: Diagnosis not present

## 2018-04-09 DIAGNOSIS — Z6831 Body mass index (BMI) 31.0-31.9, adult: Secondary | ICD-10-CM | POA: Diagnosis not present

## 2018-04-09 DIAGNOSIS — E063 Autoimmune thyroiditis: Secondary | ICD-10-CM | POA: Diagnosis not present

## 2018-04-09 DIAGNOSIS — R1031 Right lower quadrant pain: Secondary | ICD-10-CM | POA: Diagnosis not present

## 2018-04-09 DIAGNOSIS — Z79899 Other long term (current) drug therapy: Secondary | ICD-10-CM | POA: Diagnosis not present

## 2018-04-17 DIAGNOSIS — R590 Localized enlarged lymph nodes: Secondary | ICD-10-CM | POA: Diagnosis not present

## 2018-04-17 DIAGNOSIS — R1031 Right lower quadrant pain: Secondary | ICD-10-CM | POA: Diagnosis not present

## 2018-04-22 ENCOUNTER — Other Ambulatory Visit: Payer: Self-pay

## 2018-04-26 DIAGNOSIS — N3000 Acute cystitis without hematuria: Secondary | ICD-10-CM | POA: Diagnosis not present

## 2018-04-26 DIAGNOSIS — R110 Nausea: Secondary | ICD-10-CM | POA: Diagnosis not present

## 2018-04-26 DIAGNOSIS — R509 Fever, unspecified: Secondary | ICD-10-CM | POA: Diagnosis not present

## 2018-04-26 DIAGNOSIS — J069 Acute upper respiratory infection, unspecified: Secondary | ICD-10-CM | POA: Diagnosis not present

## 2018-05-04 DIAGNOSIS — N3941 Urge incontinence: Secondary | ICD-10-CM | POA: Diagnosis not present

## 2018-05-04 DIAGNOSIS — N3000 Acute cystitis without hematuria: Secondary | ICD-10-CM | POA: Diagnosis not present

## 2018-05-13 DIAGNOSIS — K5730 Diverticulosis of large intestine without perforation or abscess without bleeding: Secondary | ICD-10-CM | POA: Diagnosis not present

## 2018-05-13 DIAGNOSIS — R1031 Right lower quadrant pain: Secondary | ICD-10-CM | POA: Diagnosis not present

## 2018-05-19 DIAGNOSIS — Z6831 Body mass index (BMI) 31.0-31.9, adult: Secondary | ICD-10-CM | POA: Diagnosis not present

## 2018-05-19 DIAGNOSIS — Z79899 Other long term (current) drug therapy: Secondary | ICD-10-CM | POA: Diagnosis not present

## 2018-05-19 DIAGNOSIS — M160 Bilateral primary osteoarthritis of hip: Secondary | ICD-10-CM | POA: Diagnosis not present

## 2018-05-25 DIAGNOSIS — M7061 Trochanteric bursitis, right hip: Secondary | ICD-10-CM | POA: Diagnosis not present

## 2018-05-25 DIAGNOSIS — M1611 Unilateral primary osteoarthritis, right hip: Secondary | ICD-10-CM | POA: Diagnosis not present

## 2018-06-15 DIAGNOSIS — H353121 Nonexudative age-related macular degeneration, left eye, early dry stage: Secondary | ICD-10-CM | POA: Diagnosis not present

## 2018-06-15 DIAGNOSIS — H04123 Dry eye syndrome of bilateral lacrimal glands: Secondary | ICD-10-CM | POA: Diagnosis not present

## 2018-06-17 DIAGNOSIS — I10 Essential (primary) hypertension: Secondary | ICD-10-CM | POA: Diagnosis not present

## 2018-06-17 DIAGNOSIS — M159 Polyosteoarthritis, unspecified: Secondary | ICD-10-CM | POA: Diagnosis not present

## 2018-06-17 DIAGNOSIS — E063 Autoimmune thyroiditis: Secondary | ICD-10-CM | POA: Diagnosis not present

## 2018-06-17 DIAGNOSIS — E785 Hyperlipidemia, unspecified: Secondary | ICD-10-CM | POA: Diagnosis not present

## 2018-06-24 DIAGNOSIS — M25511 Pain in right shoulder: Secondary | ICD-10-CM | POA: Diagnosis not present

## 2018-07-06 DIAGNOSIS — M1611 Unilateral primary osteoarthritis, right hip: Secondary | ICD-10-CM | POA: Diagnosis not present

## 2018-07-09 DIAGNOSIS — M1611 Unilateral primary osteoarthritis, right hip: Secondary | ICD-10-CM | POA: Diagnosis not present

## 2018-07-09 DIAGNOSIS — M25551 Pain in right hip: Secondary | ICD-10-CM | POA: Diagnosis not present

## 2018-09-23 DIAGNOSIS — M25511 Pain in right shoulder: Secondary | ICD-10-CM | POA: Diagnosis not present

## 2018-10-12 DIAGNOSIS — H353221 Exudative age-related macular degeneration, left eye, with active choroidal neovascularization: Secondary | ICD-10-CM | POA: Diagnosis not present

## 2018-10-20 DIAGNOSIS — Z9181 History of falling: Secondary | ICD-10-CM | POA: Diagnosis not present

## 2018-10-20 DIAGNOSIS — Z6830 Body mass index (BMI) 30.0-30.9, adult: Secondary | ICD-10-CM | POA: Diagnosis not present

## 2018-10-20 DIAGNOSIS — E785 Hyperlipidemia, unspecified: Secondary | ICD-10-CM | POA: Diagnosis not present

## 2018-10-20 DIAGNOSIS — Z0000 Encounter for general adult medical examination without abnormal findings: Secondary | ICD-10-CM | POA: Diagnosis not present

## 2018-10-22 DIAGNOSIS — H353221 Exudative age-related macular degeneration, left eye, with active choroidal neovascularization: Secondary | ICD-10-CM | POA: Diagnosis not present

## 2018-10-29 DIAGNOSIS — N3941 Urge incontinence: Secondary | ICD-10-CM | POA: Diagnosis not present

## 2018-10-29 DIAGNOSIS — N3000 Acute cystitis without hematuria: Secondary | ICD-10-CM | POA: Diagnosis not present

## 2018-11-11 DIAGNOSIS — Z87440 Personal history of urinary (tract) infections: Secondary | ICD-10-CM | POA: Diagnosis not present

## 2018-11-11 DIAGNOSIS — N3941 Urge incontinence: Secondary | ICD-10-CM | POA: Diagnosis not present

## 2018-11-27 DIAGNOSIS — H353221 Exudative age-related macular degeneration, left eye, with active choroidal neovascularization: Secondary | ICD-10-CM | POA: Diagnosis not present

## 2018-12-01 DIAGNOSIS — M1610 Unilateral primary osteoarthritis, unspecified hip: Secondary | ICD-10-CM | POA: Diagnosis not present

## 2018-12-16 DIAGNOSIS — M25551 Pain in right hip: Secondary | ICD-10-CM | POA: Diagnosis not present

## 2018-12-16 DIAGNOSIS — M1612 Unilateral primary osteoarthritis, left hip: Secondary | ICD-10-CM | POA: Diagnosis not present

## 2018-12-16 DIAGNOSIS — M25552 Pain in left hip: Secondary | ICD-10-CM | POA: Diagnosis not present

## 2018-12-16 DIAGNOSIS — M1610 Unilateral primary osteoarthritis, unspecified hip: Secondary | ICD-10-CM | POA: Diagnosis not present

## 2018-12-28 DIAGNOSIS — M25511 Pain in right shoulder: Secondary | ICD-10-CM | POA: Diagnosis not present

## 2018-12-29 DIAGNOSIS — N3000 Acute cystitis without hematuria: Secondary | ICD-10-CM | POA: Diagnosis not present

## 2018-12-29 DIAGNOSIS — Z87440 Personal history of urinary (tract) infections: Secondary | ICD-10-CM | POA: Diagnosis not present

## 2018-12-29 DIAGNOSIS — N2889 Other specified disorders of kidney and ureter: Secondary | ICD-10-CM | POA: Diagnosis not present

## 2018-12-29 DIAGNOSIS — N3941 Urge incontinence: Secondary | ICD-10-CM | POA: Diagnosis not present

## 2019-01-04 DIAGNOSIS — E063 Autoimmune thyroiditis: Secondary | ICD-10-CM | POA: Diagnosis not present

## 2019-01-04 DIAGNOSIS — Z139 Encounter for screening, unspecified: Secondary | ICD-10-CM | POA: Diagnosis not present

## 2019-01-04 DIAGNOSIS — E785 Hyperlipidemia, unspecified: Secondary | ICD-10-CM | POA: Diagnosis not present

## 2019-01-04 DIAGNOSIS — M159 Polyosteoarthritis, unspecified: Secondary | ICD-10-CM | POA: Diagnosis not present

## 2019-01-04 DIAGNOSIS — I10 Essential (primary) hypertension: Secondary | ICD-10-CM | POA: Diagnosis not present

## 2019-01-08 DIAGNOSIS — H353221 Exudative age-related macular degeneration, left eye, with active choroidal neovascularization: Secondary | ICD-10-CM | POA: Diagnosis not present

## 2019-01-12 DIAGNOSIS — Z87440 Personal history of urinary (tract) infections: Secondary | ICD-10-CM | POA: Diagnosis not present

## 2019-01-12 DIAGNOSIS — N390 Urinary tract infection, site not specified: Secondary | ICD-10-CM | POA: Diagnosis not present

## 2019-01-12 DIAGNOSIS — N3941 Urge incontinence: Secondary | ICD-10-CM | POA: Diagnosis not present

## 2019-02-19 DIAGNOSIS — H353222 Exudative age-related macular degeneration, left eye, with inactive choroidal neovascularization: Secondary | ICD-10-CM | POA: Diagnosis not present

## 2019-02-25 DIAGNOSIS — M1611 Unilateral primary osteoarthritis, right hip: Secondary | ICD-10-CM | POA: Diagnosis not present

## 2019-02-25 DIAGNOSIS — M1612 Unilateral primary osteoarthritis, left hip: Secondary | ICD-10-CM | POA: Diagnosis not present

## 2019-03-08 DIAGNOSIS — E063 Autoimmune thyroiditis: Secondary | ICD-10-CM | POA: Diagnosis not present

## 2019-03-08 DIAGNOSIS — I10 Essential (primary) hypertension: Secondary | ICD-10-CM | POA: Diagnosis not present

## 2019-03-08 DIAGNOSIS — Z79899 Other long term (current) drug therapy: Secondary | ICD-10-CM | POA: Diagnosis not present

## 2019-03-08 DIAGNOSIS — N390 Urinary tract infection, site not specified: Secondary | ICD-10-CM | POA: Diagnosis not present

## 2019-03-08 DIAGNOSIS — M1612 Unilateral primary osteoarthritis, left hip: Secondary | ICD-10-CM | POA: Diagnosis not present

## 2019-03-08 DIAGNOSIS — Z01818 Encounter for other preprocedural examination: Secondary | ICD-10-CM | POA: Diagnosis not present

## 2019-03-10 DIAGNOSIS — M19011 Primary osteoarthritis, right shoulder: Secondary | ICD-10-CM | POA: Diagnosis not present

## 2019-03-10 DIAGNOSIS — Z01818 Encounter for other preprocedural examination: Secondary | ICD-10-CM | POA: Diagnosis not present

## 2019-03-10 DIAGNOSIS — I700 Atherosclerosis of aorta: Secondary | ICD-10-CM | POA: Diagnosis not present

## 2019-03-15 DIAGNOSIS — M25411 Effusion, right shoulder: Secondary | ICD-10-CM | POA: Diagnosis not present

## 2019-03-15 DIAGNOSIS — M25611 Stiffness of right shoulder, not elsewhere classified: Secondary | ICD-10-CM | POA: Diagnosis not present

## 2019-03-15 DIAGNOSIS — M25511 Pain in right shoulder: Secondary | ICD-10-CM | POA: Diagnosis not present

## 2019-03-17 DIAGNOSIS — M25411 Effusion, right shoulder: Secondary | ICD-10-CM | POA: Diagnosis not present

## 2019-03-17 DIAGNOSIS — M25611 Stiffness of right shoulder, not elsewhere classified: Secondary | ICD-10-CM | POA: Diagnosis not present

## 2019-03-17 DIAGNOSIS — M25511 Pain in right shoulder: Secondary | ICD-10-CM | POA: Diagnosis not present

## 2019-03-23 DIAGNOSIS — M25511 Pain in right shoulder: Secondary | ICD-10-CM | POA: Diagnosis not present

## 2019-03-23 DIAGNOSIS — M25611 Stiffness of right shoulder, not elsewhere classified: Secondary | ICD-10-CM | POA: Diagnosis not present

## 2019-03-23 DIAGNOSIS — M25411 Effusion, right shoulder: Secondary | ICD-10-CM | POA: Diagnosis not present

## 2019-03-29 DIAGNOSIS — M19012 Primary osteoarthritis, left shoulder: Secondary | ICD-10-CM | POA: Diagnosis not present

## 2019-03-29 DIAGNOSIS — M25512 Pain in left shoulder: Secondary | ICD-10-CM | POA: Diagnosis not present

## 2019-03-29 DIAGNOSIS — M25511 Pain in right shoulder: Secondary | ICD-10-CM | POA: Diagnosis not present

## 2019-03-29 DIAGNOSIS — M25411 Effusion, right shoulder: Secondary | ICD-10-CM | POA: Diagnosis not present

## 2019-03-29 DIAGNOSIS — M25611 Stiffness of right shoulder, not elsewhere classified: Secondary | ICD-10-CM | POA: Diagnosis not present

## 2019-04-05 DIAGNOSIS — M25611 Stiffness of right shoulder, not elsewhere classified: Secondary | ICD-10-CM | POA: Diagnosis not present

## 2019-04-05 DIAGNOSIS — M25411 Effusion, right shoulder: Secondary | ICD-10-CM | POA: Diagnosis not present

## 2019-04-05 DIAGNOSIS — M25511 Pain in right shoulder: Secondary | ICD-10-CM | POA: Diagnosis not present

## 2019-04-14 DIAGNOSIS — M19011 Primary osteoarthritis, right shoulder: Secondary | ICD-10-CM | POA: Diagnosis not present

## 2019-04-21 DIAGNOSIS — N390 Urinary tract infection, site not specified: Secondary | ICD-10-CM | POA: Diagnosis not present

## 2019-04-21 DIAGNOSIS — N3000 Acute cystitis without hematuria: Secondary | ICD-10-CM | POA: Diagnosis not present

## 2019-04-22 NOTE — H&P (Signed)
TOTAL HIP ADMISSION H&P  Patient is admitted for left total hip arthroplasty.  Subjective:  Chief Complaint: left hip pain  HPI: ISISS HORLICK, 83 y.o. female, has a history of pain and functional disability in the left hip(s) due to arthritis and patient has failed non-surgical conservative treatments for greater than 12 weeks to include NSAID's and/or analgesics, corticosteriod injections, flexibility and strengthening excercises and activity modification.  Onset of symptoms was gradual starting 3 years ago with gradually worsening course since that time.The patient noted no past surgery on the left hip(s).  Patient currently rates pain in the left hip at 4 out of 10 with activity. Patient has night pain, worsening of pain with activity and weight bearing, pain that interfers with activities of daily living and pain with passive range of motion. Patient has evidence of periarticular osteophytes and joint space narrowing by imaging studies. This condition presents safety issues increasing the risk of falls. There is no current active infection.  Past Medical History Anxiety Disorder Depression High Cholesterol Hypertension Osteoarthritis Thyroid Problems/Goiter Urinary Infection     Current Outpatient Medications  Medication Sig Dispense Refill Last Dose  . acetaminophen (TYLENOL) 500 MG tablet Take 500 mg by mouth every 6 (six) hours as needed for moderate pain or headache.     Marland Kitchen amLODipine (NORVASC) 5 MG tablet Take 5 mg by mouth daily.     Marland Kitchen atenolol (TENORMIN) 50 MG tablet Take 50 mg by mouth daily.     . calcium carbonate (TUMS - DOSED IN MG ELEMENTAL CALCIUM) 500 MG chewable tablet Chew 1 tablet by mouth daily as needed for indigestion or heartburn.     . celecoxib (CELEBREX) 200 MG capsule Take 200 mg by mouth daily.     . cetirizine (ZYRTEC) 10 MG tablet Take 10 mg by mouth daily.     . D-MANNOSE PO Take 1,000 mg by mouth 2 (two) times daily.     . fluticasone (FLONASE) 50  MCG/ACT nasal spray Place 2 sprays into both nostrils 2 (two) times daily.     Marland Kitchen levothyroxine (SYNTHROID) 25 MCG tablet Take 25 mcg by mouth daily before breakfast.     . LORazepam (ATIVAN) 0.5 MG tablet Take 0.5 mg by mouth at bedtime.     . Magnesium 200 MG TABS Take 200 mg by mouth daily.     . Multiple Vitamins-Minerals (PRESERVISION AREDS 2) CAPS Take 1 capsule by mouth 2 (two) times daily.     Vladimir Faster Glycol-Propyl Glycol (SYSTANE OP) Place 1 drop into both eyes daily as needed (dry eyes).     . Potassium 99 MG TABS Take 99 mg by mouth daily.     . sertraline (ZOLOFT) 50 MG tablet Take 50 mg by mouth at bedtime.     . simvastatin (ZOCOR) 20 MG tablet Take 20 mg by mouth daily.     . Cholecalciferol (VITAMIN D) 50 MCG (2000 UT) tablet Take 2,000 Units by mouth daily.     . Coenzyme Q10 (COQ10) 100 MG CAPS Take 100 mg by mouth daily.     . Multiple Vitamins-Minerals (CENTRUM SILVER PO) Take 1 tablet by mouth daily.      Allergies  Allergen Reactions  . Penicillins Rash    Did it involve swelling of the face/tongue/throat, SOB, or low BP? No Did it involve sudden or severe rash/hives, skin peeling, or any reaction on the inside of your mouth or nose? Yes Did you need to seek medical attention at  a hospital or doctor's office? Yes When did it last happen?4 years If all above answers are "NO", may proceed with cephalosporin use.   . Sulfa Antibiotics Rash    Social History   Tobacco Use  . Smoking status: Never  Substance Use Topics  . Alcohol use: None      Review of Systems  Constitutional: Negative.   HENT: Negative.   Eyes: Negative.   Respiratory: Negative.   Cardiovascular: Negative.   Gastrointestinal: Negative.   Genitourinary: Negative.   Musculoskeletal: Positive for joint pain and myalgias.  Skin: Negative.   Neurological: Negative.   Endo/Heme/Allergies: Negative.   Psychiatric/Behavioral: Positive for depression. Negative for hallucinations,  memory loss, substance abuse and suicidal ideas. The patient is nervous/anxious. The patient does not have insomnia.     Objective:  Physical Exam  Constitutional: She is oriented to person, place, and time. She appears well-developed. No distress.  HENT:  Head: Normocephalic and atraumatic.  Right Ear: External ear normal.  Left Ear: External ear normal.  Mouth/Throat: Oropharynx is clear and moist.  Eyes: Conjunctivae and EOM are normal.  Neck: Normal range of motion. Neck supple.  Cardiovascular: Normal rate, regular rhythm, normal heart sounds and intact distal pulses.  No murmur heard. Respiratory: Effort normal and breath sounds normal. No respiratory distress. She has no wheezes.  GI: Soft. Bowel sounds are normal. She exhibits no distension. There is no abdominal tenderness.  Musculoskeletal:     Comments: Significantly antalgic gait pattern favoring the left side without the use of assisted devices.  Right Hip Exam: ROM: Flexion to 110, Internal Rotation 20, External Rotation 30, and Abduction 30 with pain. There is no tenderness over the greater trochanteric bursa.  Left Hip Exam: ROM: Flexion to 100 degrees, Internal Rotation is minimal, External Rotation 20 to 30 degrees, and Abduction 30 degrees with pain. There is no tenderness over the greater trochanter bursa.  Neurological: She is alert and oriented to person, place, and time. She has normal strength. No sensory deficit.  Skin: No rash noted. She is not diaphoretic. No erythema.  Psychiatric: She has a normal mood and affect. Her behavior is normal.     Ht: 5 ft 2.5 in  Wt: 172.6 lbs  BMI: 31.1  BP: 134/78  Pulse: 72 bpm    Imaging Review Plain radiographs demonstrate severe degenerative joint disease of the left hip(s). The bone quality appears to be good for age and reported activity level.    Assessment/Plan:  End stage primary osteoarthritis, left hip(s)  The patient history, physical  examination, clinical judgement of the provider and imaging studies are consistent with end stage degenerative joint disease of the left hip(s) and total hip arthroplasty is deemed medically necessary. The treatment options including medical management, injection therapy, arthroscopy and arthroplasty were discussed at length. The risks and benefits of total hip arthroplasty were presented and reviewed. The risks due to aseptic loosening, infection, stiffness, dislocation/subluxation,  thromboembolic complications and other imponderables were discussed.  The patient acknowledged the explanation, agreed to proceed with the plan and consent was signed. Patient is being admitted for inpatient treatment for surgery, pain control, PT, OT, prophylactic antibiotics, VTE prophylaxis, progressive ambulation and ADL's and discharge planning.The patient is planning to be discharged home.    Risks and benefits of the surgery were discussed with the patient and Dr.Aluisio at their previous office visit, and the patient has elected to move forward with the aforementioned surgery. Post-operative care plans were discussed with the  patient today.    Therapy Plans: HEP Disposition: Home with son Planned DVT prophylaxis: aspirin 325mg  BID DME needed: walker, 3-n-1 PCP: Dr. Gwenyth Ober received Urologist: Dr. Wynetta Emery in [place]; hx of UTI, will get C&S week before surgery  Other: no anesthesia concerns  Instructed patient on which meds to stop prior to surgery   Ardeen Jourdain, PA-C

## 2019-04-23 ENCOUNTER — Other Ambulatory Visit (HOSPITAL_COMMUNITY): Payer: Self-pay | Admitting: *Deleted

## 2019-04-23 NOTE — Patient Instructions (Addendum)
DUE TO COVID-19 ONLY ONE VISITOR IS ALLOWED TO COME WITH YOU AND STAY IN THE WAITING ROOM ONLY DURING PRE OP AND PROCEDURE DAY OF SURGERY. THE 1 VISITOR MAY VISIT WITH YOU AFTER SURGERY IN YOUR PRIVATE ROOM DURING VISITING HOURS ONLY!  YOU HAD  A COVID 19 TEST ON 04-24-2019.  ONCE YOUR COVID TEST IS COMPLETED, PLEASE BEGIN THE QUARANTINE INSTRUCTIONS AS OUTLINED IN YOUR HANDOUT.                Melissa Love    Your procedure is scheduled on: 04-28-2019   Report to [facility] Main  Entrance    Report to admitting at 950  AM     Call this number if you have problems the morning of surgery [phone]    Remember: [place], NO [place].    NO SOLID FOOD AFTER MIDNIGHT THE NIGHT PRIOR TO SURGERY. NOTHING BY MOUTH EXCEPT CLEAR LIQUIDS UNTIL 920 AM .    PLEASE FINISH ENSURE DRINK PER SURGEON ORDER  WHICH NEEDS TO BE COMPLETED AT 920 AM .   CLEAR LIQUID DIET   Foods Allowed                                                                     Foods Excluded  Coffee and tea, regular and decaf                             liquids that you cannot  Plain Jell-O any favor except red or purple                                           see through such as: Fruit ices (not with fruit pulp)                                     milk, soups, orange juice  Iced Popsicles                                    All solid food Carbonated beverages, regular and diet                                    Cranberry, grape and apple juices Sports drinks like Gatorade Lightly seasoned clear broth or consume(fat free) Sugar, honey syrup  Sample Menu Breakfast                                Lunch                                     Supper Cranberry juice  Beef broth                            Chicken broth Jell-O                                     Grape juice                           Apple juice Coffee or tea                         Jell-O                                      Popsicle                                                Coffee or tea                        Coffee or tea  _____________________________________________________________________     Take these medicines the morning of surgery with A SIP OF WATER: CERTRIZINE (ZYRTEC), SIMVASTATIN (ZOCOR), ATENOLOL, AMLODIPINE (NORVASC), FLONASE NASAL SPRAY, LEVOTHYROXINE (SYNTHROID)                                You may not have any metal on your body including hair pins and              piercings  Do not wear jewelry, make-up, lotions, powders or perfumes, deodorant             Do not wear nail polish on your fingernails.  Do not shave  48 hours prior to surgery.                Do not bring valuables to the hospital. [place].  Contacts, dentures or bridgework may not be worn into surgery.  Leave suitcase in the car. After surgery it may be brought to your room.                   Please read over the following fact sheets you were given: _____________________________________________________________________             [facility] - Preparing for Surgery Before surgery, you can play an important role.  Because skin is not sterile, your skin needs to be as free of germs as possible.  You can reduce the number of germs on your skin by washing with CHG (chlorahexidine gluconate) soap before surgery.  CHG is an antiseptic cleaner which kills germs and bonds with the skin to continue killing germs even after washing. Please DO NOT use if you have an allergy to CHG or antibacterial soaps.  If your skin becomes reddened/irritated stop using the CHG and inform your nurse when you arrive at Short Stay. Do not shave (including legs and underarms) for at least 48 hours prior to the  first CHG shower.  You may shave your face/neck. Please follow these instructions carefully:  1.  Shower with CHG Soap  the night before surgery and the  morning of Surgery.  2.  If you choose to wash your hair, wash your hair first as usual with your  normal  shampoo.  3.  After you shampoo, rinse your hair and body thoroughly to remove the  shampoo.                           4.  Use CHG as you would any other liquid soap.  You can apply chg directly  to the skin and wash                       Gently with a scrungie or clean washcloth.  5.  Apply the CHG Soap to your body ONLY FROM THE NECK DOWN.   Do not use on face/ open                           Wound or open sores. Avoid contact with eyes, ears mouth and genitals (private parts).                       Wash face,  Genitals (private parts) with your normal soap.             6.  Wash thoroughly, paying special attention to the area where your surgery  will be performed.  7.  Thoroughly rinse your body with warm water from the neck down.  8.  DO NOT shower/wash with your normal soap after using and rinsing off  the CHG Soap.                9.  Pat yourself dry with a clean towel.            10.  Wear clean pajamas.            11.  Place clean sheets on your bed the night of your first shower and do not  sleep with pets. Day of Surgery : Do not apply any lotions/deodorants the morning of surgery.  Please wear clean clothes to the hospital/surgery center.  FAILURE TO FOLLOW THESE INSTRUCTIONS MAY RESULT IN THE CANCELLATION OF YOUR SURGERY PATIENT SIGNATURE_________________________________  NURSE SIGNATURE__________________________________  ________________________________________________________________________   Melissa Love  An incentive spirometer is a tool that can help keep your lungs clear and active. This tool measures how well you are filling your lungs with each breath. Taking long deep breaths may help reverse or decrease the chance of developing breathing (pulmonary) problems (especially infection) following:  A long period of time when  you are unable to move or be active. BEFORE THE PROCEDURE   If the spirometer includes an indicator to show your best effort, your nurse or respiratory therapist will set it to a desired goal.  If possible, sit up straight or lean slightly forward. Try not to slouch.  Hold the incentive spirometer in an upright position. INSTRUCTIONS FOR USE  1. Sit on the edge of your bed if possible, or sit up as far as you can in bed or on a chair. 2. Hold the incentive spirometer in an upright position. 3. Breathe out normally. 4. Place the mouthpiece in your mouth and seal your lips tightly around it. 5. Breathe in slowly and  as deeply as possible, raising the piston or the ball toward the top of the column. 6. Hold your breath for 3-5 seconds or for as long as possible. Allow the piston or ball to fall to the bottom of the column. 7. Remove the mouthpiece from your mouth and breathe out normally. 8. Rest for a few seconds and repeat Steps 1 through 7 at least 10 times every 1-2 hours when you are awake. Take your time and take a few normal breaths between deep breaths. 9. The spirometer may include an indicator to show your best effort. Use the indicator as a goal to work toward during each repetition. 10. After each set of 10 deep breaths, practice coughing to be sure your lungs are clear. If you have an incision (the cut made at the time of surgery), support your incision when coughing by placing a pillow or rolled up towels firmly against it. Once you are able to get out of bed, walk around indoors and cough well. You may stop using the incentive spirometer when instructed by your caregiver.  RISKS AND COMPLICATIONS  Take your time so you do not get dizzy or light-headed.  If you are in pain, you may need to take or ask for pain medication before doing incentive spirometry. It is harder to take a deep breath if you are having pain. AFTER USE  Rest and breathe slowly and easily.  It can be  helpful to keep track of a log of your progress. Your caregiver can provide you with a simple table to help with this. If you are using the spirometer at home, follow these instructions: [place] IF:   You are having difficultly using the spirometer.  You have trouble using the spirometer as often as instructed.  Your pain medication is not giving enough relief while using the spirometer.  You develop fever of 100.5 F (38.1 C) or higher. SEEK IMMEDIATE MEDICAL CARE IF:   You cough up bloody sputum that had not been present before.  You develop fever of 102 F (38.9 C) or greater.  You develop worsening pain at or near the incision site. MAKE SURE YOU:   Understand these instructions.  Will watch your condition.  Will get help right away if you are not doing well or get worse. Document Released: 09/30/2006 Document Revised: 08/12/2011 Document Reviewed: 12/01/2006 Exit[employer] Patient Information 2014 [employer], [place].   ________________________________________________________________________  WHAT IS A BLOOD TRANSFUSION? Blood Transfusion Information  A transfusion is the replacement of blood or some of its parts. Blood is made up of multiple cells which provide different functions.  Red blood cells carry oxygen and are used for blood loss replacement.  White blood cells fight against infection.  Platelets control bleeding.  Plasma helps clot blood.  Other blood products are available for specialized needs, such as hemophilia or other clotting disorders. BEFORE THE TRANSFUSION  Who gives blood for transfusions?   Healthy volunteers who are fully evaluated to make sure their blood is safe. This is blood bank blood. Transfusion therapy is the safest it has ever been in the practice of medicine. Before blood is taken from a donor, a complete history is taken to make sure that person has no history of diseases nor engages in risky social behavior (examples are  intravenous drug use or sexual activity with multiple partners). The donor's travel history is screened to minimize risk of transmitting infections, such as malaria. The donated blood is tested for signs of  infectious diseases, such as HIV and hepatitis. The blood is then tested to be sure it is compatible with you in order to minimize the chance of a transfusion reaction. If you or a relative donates blood, this is often done in anticipation of surgery and is not appropriate for emergency situations. It takes many days to process the donated blood. RISKS AND COMPLICATIONS Although transfusion therapy is very safe and saves many lives, the main dangers of transfusion include:   Getting an infectious disease.  Developing a transfusion reaction. This is an allergic reaction to something in the blood you were given. Every precaution is taken to prevent this. The decision to have a blood transfusion has been considered carefully by your caregiver before blood is given. Blood is not given unless the benefits outweigh the risks. AFTER THE TRANSFUSION  Right after receiving a blood transfusion, you will usually feel much better and more energetic. This is especially true if your red blood cells have gotten low (anemic). The transfusion raises the level of the red blood cells which carry oxygen, and this usually causes an energy increase.  The nurse administering the transfusion will monitor you carefully for complications. HOME CARE INSTRUCTIONS  No special instructions are needed after a transfusion. You may find your energy is better. Speak with your caregiver about any limitations on activity for underlying diseases you may have. SEEK MEDICAL CARE IF:   Your condition is not improving after your transfusion.  You develop redness or irritation at the intravenous (IV) site. SEEK IMMEDIATE MEDICAL CARE IF:  Any of the following symptoms occur over the next 12 hours:  Shaking chills.  You have a  temperature by mouth above 102 F (38.9 C), not controlled by medicine.  Chest, back, or muscle pain.  People around you feel you are not acting correctly or are confused.  Shortness of breath or difficulty breathing.  Dizziness and fainting.  You get a rash or develop hives.  You have a decrease in urine output.  Your urine turns a dark color or changes to pink, red, or brown. Any of the following symptoms occur over the next 10 days:  You have a temperature by mouth above 102 F (38.9 C), not controlled by medicine.  Shortness of breath.  Weakness after normal activity.  The white part of the eye turns yellow (jaundice).  You have a decrease in the amount of urine or are urinating less often.  Your urine turns a dark color or changes to pink, red, or brown. Document Released: 05/17/2000 Document Revised: 08/12/2011 Document Reviewed: 01/04/2008 [facility] Patient Information 2014 [place], [place].  _______________________________________________________________________

## 2019-04-24 ENCOUNTER — Other Ambulatory Visit (HOSPITAL_COMMUNITY)
Admission: RE | Admit: 2019-04-24 | Discharge: 2019-04-24 | Disposition: A | Discharge status: Home / Self Care | Payer: Medicare Other | Source: Ambulatory Visit | Attending: Orthopedic Surgery | Admitting: Orthopedic Surgery

## 2019-04-24 DIAGNOSIS — Z20828 Contact with and (suspected) exposure to other viral communicable diseases: Secondary | ICD-10-CM | POA: Insufficient documentation

## 2019-04-24 DIAGNOSIS — Z01812 Encounter for preprocedural laboratory examination: Secondary | ICD-10-CM | POA: Insufficient documentation

## 2019-04-26 ENCOUNTER — Other Ambulatory Visit: Payer: Self-pay

## 2019-04-26 ENCOUNTER — Encounter (HOSPITAL_COMMUNITY)
Admission: RE | Admit: 2019-04-26 | Discharge: 2019-04-26 | Disposition: A | Discharge status: Home / Self Care | Payer: Medicare Other | Source: Ambulatory Visit | Attending: Orthopedic Surgery | Admitting: Orthopedic Surgery

## 2019-04-26 ENCOUNTER — Encounter (HOSPITAL_COMMUNITY): Payer: Self-pay

## 2019-04-26 DIAGNOSIS — M1612 Unilateral primary osteoarthritis, left hip: Secondary | ICD-10-CM | POA: Diagnosis not present

## 2019-04-26 DIAGNOSIS — Z01812 Encounter for preprocedural laboratory examination: Secondary | ICD-10-CM | POA: Diagnosis not present

## 2019-04-26 HISTORY — DX: Hypothyroidism, unspecified: E03.9

## 2019-04-26 HISTORY — DX: Essential (primary) hypertension: I10

## 2019-04-26 HISTORY — DX: Headache, unspecified: R51.9

## 2019-04-26 HISTORY — DX: Anxiety disorder, unspecified: F41.9

## 2019-04-26 HISTORY — DX: Unspecified macular degeneration: H35.30

## 2019-04-26 HISTORY — DX: Unspecified osteoarthritis, unspecified site: M19.90

## 2019-04-26 LAB — CBC WITH DIFFERENTIAL/PLATELET
Abs Immature Granulocytes: 0.07 10*3/uL (ref 0.00–0.07)
Basophils Absolute: 0.1 10*3/uL (ref 0.0–0.1)
Basophils Relative: 1 %
Eosinophils Absolute: 0.1 10*3/uL (ref 0.0–0.5)
Eosinophils Relative: 1 %
HCT: 47.8 % — ABNORMAL HIGH (ref 36.0–46.0)
Hemoglobin: 15.1 g/dL — ABNORMAL HIGH (ref 12.0–15.0)
Immature Granulocytes: 1 %
Lymphocytes Relative: 25 %
Lymphs Abs: 2.3 10*3/uL (ref 0.7–4.0)
MCH: 30.6 pg (ref 26.0–34.0)
MCHC: 31.6 g/dL (ref 30.0–36.0)
MCV: 96.8 fL (ref 80.0–100.0)
Monocytes Absolute: 0.7 10*3/uL (ref 0.1–1.0)
Monocytes Relative: 7 %
Neutro Abs: 6.2 10*3/uL (ref 1.7–7.7)
Neutrophils Relative %: 65 %
Platelets: 222 10*3/uL (ref 150–400)
RBC: 4.94 MIL/uL (ref 3.87–5.11)
RDW: 14.3 % (ref 11.5–15.5)
WBC: 9.4 10*3/uL (ref 4.0–10.5)
nRBC: 0 % (ref 0.0–0.2)

## 2019-04-26 LAB — COMPREHENSIVE METABOLIC PANEL
ALT: 33 U/L (ref 0–44)
AST: 23 U/L (ref 15–41)
Albumin: 4 g/dL (ref 3.5–5.0)
Alkaline Phosphatase: 80 U/L (ref 38–126)
Anion gap: 9 (ref 5–15)
BUN: 19 mg/dL (ref 8–23)
CO2: 27 mmol/L (ref 22–32)
Calcium: 9.3 mg/dL (ref 8.9–10.3)
Chloride: 104 mmol/L (ref 98–111)
Creatinine, Ser: 0.73 mg/dL (ref 0.44–1.00)
GFR calc Af Amer: >60 mL/min (ref >60)
GFR calc non Af Amer: >60 mL/min (ref >60)
Glucose, Bld: 97 mg/dL (ref 70–99)
Potassium: 4.4 mmol/L (ref 3.5–5.1)
Sodium: 140 mmol/L (ref 135–145)
Total Bilirubin: 0.7 mg/dL (ref 0.3–1.2)
Total Protein: 6.8 g/dL (ref 6.5–8.1)

## 2019-04-26 LAB — ABO/RH: ABO/RH(D): O POS

## 2019-04-26 LAB — PROTIME-INR
INR: 1 (ref 0.8–1.2)
Prothrombin Time: 12.9 seconds (ref 11.4–15.2)

## 2019-04-26 LAB — SURGICAL PCR SCREEN
MRSA, PCR: NEGATIVE
Staphylococcus aureus: NEGATIVE

## 2019-04-26 LAB — NOVEL CORONAVIRUS, NAA (HOSP ORDER, SEND-OUT TO REF LAB; TAT 18-24 HRS): SARS-CoV-2, NAA: NOT DETECTED

## 2019-04-26 LAB — APTT: aPTT: 28 seconds (ref 24–36)

## 2019-04-26 NOTE — Progress Notes (Addendum)
PCP - Dr. Jenean Lindau  LOV 03/10/2019 medical clearance on chart Cardiologist -none   Chest x-ray - 03/11/2019 on chart [facility] EKG - 03/08/2019 Dr. Megan Salon on chart Stress Test - none ECHO - approximately 20 years ago and cannot remember the reason why Cardiac Cath -none   Sleep Study - none CPAP - none  Fasting Blood Sugar - none Checks Blood Sugar __0___ times a day  Blood Thinner Instructions:none Aspirin Instructions:none Last Dose:none  Anesthesia review:    Patient denies shortness of breath, fever, cough and chest pain at PAT appointment   Patient verbalized understanding of instructions that were given to them at the PAT appointment. Patient was also instructed that they will need to review over the PAT instructions again at home before surgery.

## 2019-04-27 NOTE — Anesthesia Preprocedure Evaluation (Addendum)
Anesthesia Evaluation  Patient identified by MRN, date of birth, ID band Patient awake    Reviewed: Allergy & Precautions, NPO status , Patient's Chart, lab work & pertinent test results, reviewed documented beta blocker date and time   Airway Mallampati: II  TM Distance: >3 FB Neck ROM: Full    Dental no notable dental hx.    Pulmonary former smoker,  Quit smoking 1990, 5 pack year history   Pulmonary exam normal breath sounds clear to auscultation       Cardiovascular hypertension, Pt. on medications and Pt. on home beta blockers negative cardio ROS Normal cardiovascular exam Rhythm:Regular Rate:Normal     Neuro/Psych  Headaches, PSYCHIATRIC DISORDERS Anxiety Depression Has not had migraine in very long time    GI/Hepatic negative GI ROS, Neg liver ROS,   Endo/Other  Hypothyroidism   Renal/GU negative Renal ROS  negative genitourinary   Musculoskeletal  (+) Arthritis , Osteoarthritis,    Abdominal Normal abdominal exam  (+)   Peds negative pediatric ROS (+)  Hematology negative hematology ROS (+)   Anesthesia Other Findings HLD  Took ativan last night  Reproductive/Obstetrics negative OB ROS                           Anesthesia Physical Anesthesia Plan  ASA: III  Anesthesia Plan: General   Post-op Pain Management:    Induction: Intravenous  PONV Risk Score and Plan: 2 and Propofol infusion, TIVA and Treatment may vary due to age or medical condition  Airway Management Planned: Natural Airway and Simple Face Mask  Additional Equipment: None  Intra-op Plan:   Post-operative Plan: Extubation in OR  Informed Consent: I have reviewed the patients History and Physical, chart, labs and discussed the procedure including the risks, benefits and alternatives for the proposed anesthesia with the patient or authorized representative who has indicated his/her understanding and  acceptance.     Dental advisory given  Plan Discussed with: CRNA  Anesthesia Plan Comments: (Reviewed in depth the risk of GA/ETT vs spinal with sedation and patient is adamantly refusing spinal anesthetic due to history of chronic low back pain. Has never had a spinal before.)      Anesthesia Quick Evaluation

## 2019-04-28 ENCOUNTER — Inpatient Hospital Stay (HOSPITAL_COMMUNITY): Payer: Medicare Other

## 2019-04-28 ENCOUNTER — Observation Stay (HOSPITAL_COMMUNITY)
Admission: RE | Admit: 2019-04-28 | Discharge: 2019-04-29 | Disposition: A | Discharge status: Home / Self Care | Payer: Medicare Other | Attending: Orthopedic Surgery | Admitting: Orthopedic Surgery

## 2019-04-28 ENCOUNTER — Observation Stay (HOSPITAL_COMMUNITY): Payer: Medicare Other

## 2019-04-28 ENCOUNTER — Inpatient Hospital Stay (HOSPITAL_COMMUNITY): Payer: Medicare Other | Admitting: Certified Registered Nurse Anesthetist

## 2019-04-28 ENCOUNTER — Encounter (HOSPITAL_COMMUNITY)
Admission: RE | Disposition: A | Discharge status: Home / Self Care | Payer: Self-pay | Source: Home / Self Care | Attending: Orthopedic Surgery

## 2019-04-28 ENCOUNTER — Inpatient Hospital Stay (HOSPITAL_COMMUNITY): Payer: Medicare Other | Admitting: Physician Assistant

## 2019-04-28 ENCOUNTER — Other Ambulatory Visit: Payer: Self-pay

## 2019-04-28 ENCOUNTER — Encounter (HOSPITAL_COMMUNITY): Payer: Self-pay

## 2019-04-28 DIAGNOSIS — Z7982 Long term (current) use of aspirin: Secondary | ICD-10-CM | POA: Diagnosis not present

## 2019-04-28 DIAGNOSIS — I10 Essential (primary) hypertension: Secondary | ICD-10-CM | POA: Diagnosis not present

## 2019-04-28 DIAGNOSIS — Z882 Allergy status to sulfonamides status: Secondary | ICD-10-CM | POA: Insufficient documentation

## 2019-04-28 DIAGNOSIS — Z79899 Other long term (current) drug therapy: Secondary | ICD-10-CM | POA: Insufficient documentation

## 2019-04-28 DIAGNOSIS — E039 Hypothyroidism, unspecified: Secondary | ICD-10-CM | POA: Insufficient documentation

## 2019-04-28 DIAGNOSIS — F418 Other specified anxiety disorders: Secondary | ICD-10-CM | POA: Diagnosis not present

## 2019-04-28 DIAGNOSIS — Z880 Allergy status to penicillin: Secondary | ICD-10-CM | POA: Diagnosis not present

## 2019-04-28 DIAGNOSIS — Z791 Long term (current) use of non-steroidal anti-inflammatories (NSAID): Secondary | ICD-10-CM | POA: Diagnosis not present

## 2019-04-28 DIAGNOSIS — M1611 Unilateral primary osteoarthritis, right hip: Secondary | ICD-10-CM | POA: Insufficient documentation

## 2019-04-28 DIAGNOSIS — Z419 Encounter for procedure for purposes other than remedying health state, unspecified: Secondary | ICD-10-CM

## 2019-04-28 DIAGNOSIS — M1612 Unilateral primary osteoarthritis, left hip: Secondary | ICD-10-CM | POA: Diagnosis not present

## 2019-04-28 DIAGNOSIS — E049 Nontoxic goiter, unspecified: Secondary | ICD-10-CM | POA: Insufficient documentation

## 2019-04-28 DIAGNOSIS — M169 Osteoarthritis of hip, unspecified: Secondary | ICD-10-CM | POA: Diagnosis present

## 2019-04-28 DIAGNOSIS — F329 Major depressive disorder, single episode, unspecified: Secondary | ICD-10-CM | POA: Diagnosis not present

## 2019-04-28 DIAGNOSIS — F419 Anxiety disorder, unspecified: Secondary | ICD-10-CM | POA: Insufficient documentation

## 2019-04-28 DIAGNOSIS — Z87891 Personal history of nicotine dependence: Secondary | ICD-10-CM | POA: Diagnosis not present

## 2019-04-28 DIAGNOSIS — E7800 Pure hypercholesterolemia, unspecified: Secondary | ICD-10-CM | POA: Diagnosis not present

## 2019-04-28 DIAGNOSIS — Z96642 Presence of left artificial hip joint: Secondary | ICD-10-CM | POA: Diagnosis not present

## 2019-04-28 DIAGNOSIS — E785 Hyperlipidemia, unspecified: Secondary | ICD-10-CM | POA: Diagnosis not present

## 2019-04-28 DIAGNOSIS — Z96649 Presence of unspecified artificial hip joint: Secondary | ICD-10-CM

## 2019-04-28 DIAGNOSIS — Z471 Aftercare following joint replacement surgery: Secondary | ICD-10-CM | POA: Diagnosis not present

## 2019-04-28 DIAGNOSIS — R519 Headache, unspecified: Secondary | ICD-10-CM | POA: Diagnosis not present

## 2019-04-28 HISTORY — PX: TOTAL HIP ARTHROPLASTY: SHX124

## 2019-04-28 LAB — TYPE AND SCREEN
ABO/RH(D): O POS
Antibody Screen: NEGATIVE

## 2019-04-28 SURGERY — ARTHROPLASTY, HIP, TOTAL, ANTERIOR APPROACH
Anesthesia: General | Site: Hip | Laterality: Left

## 2019-04-28 MED ORDER — ASPIRIN EC 325 MG PO TBEC
325.0000 mg | DELAYED_RELEASE_TABLET | Freq: Two times a day (BID) | ORAL | Status: DC
  Administered 2019-04-29: 10:00:00 325 mg via ORAL
  Filled 2019-04-28: qty 1

## 2019-04-28 MED ORDER — CHLORHEXIDINE GLUCONATE 4 % EX LIQD: 60.0000 mL | Freq: Once | CUTANEOUS | Status: DC

## 2019-04-28 MED ORDER — FENTANYL CITRATE (PF) 100 MCG/2ML IJ SOLN
INTRAMUSCULAR | Status: AC
  Filled 2019-04-28: qty 2

## 2019-04-28 MED ORDER — 0.9 % SODIUM CHLORIDE (POUR BTL) OPTIME
TOPICAL | Status: DC | PRN
  Administered 2019-04-28: 13:00:00 1000 mL

## 2019-04-28 MED ORDER — MAGNESIUM CITRATE PO SOLN: 1.0000 | Freq: Once | ORAL | Status: DC | PRN

## 2019-04-28 MED ORDER — EPHEDRINE 5 MG/ML INJ
INTRAVENOUS | Status: AC
  Filled 2019-04-28: qty 10

## 2019-04-28 MED ORDER — DEXAMETHASONE SODIUM PHOSPHATE 10 MG/ML IJ SOLN
10.0000 mg | Freq: Once | INTRAMUSCULAR | Status: AC
  Administered 2019-04-29: 10:00:00 10 mg via INTRAVENOUS
  Filled 2019-04-28: qty 1

## 2019-04-28 MED ORDER — D-MANNOSE 500 MG PO CAPS: 1000.0000 mg | ORAL_CAPSULE | Freq: Two times a day (BID) | ORAL | Status: DC

## 2019-04-28 MED ORDER — ACETAMINOPHEN 10 MG/ML IV SOLN
1000.0000 mg | Freq: Four times a day (QID) | INTRAVENOUS | Status: DC
  Administered 2019-04-28: 1000 mg via INTRAVENOUS
  Filled 2019-04-28: qty 100

## 2019-04-28 MED ORDER — METOCLOPRAMIDE HCL 5 MG/ML IJ SOLN: 5.0000 mg | Freq: Three times a day (TID) | INTRAMUSCULAR | Status: DC | PRN

## 2019-04-28 MED ORDER — MORPHINE SULFATE (PF) 4 MG/ML IV SOLN: 0.5000 mg | INTRAVENOUS | Status: DC | PRN

## 2019-04-28 MED ORDER — METHOCARBAMOL 500 MG PO TABS: 500.0000 mg | ORAL_TABLET | Freq: Four times a day (QID) | ORAL | 0 refills | Status: DC | PRN

## 2019-04-28 MED ORDER — METHOCARBAMOL 500 MG IVPB - SIMPLE MED
INTRAVENOUS | Status: AC
  Filled 2019-04-28: qty 50

## 2019-04-28 MED ORDER — POLYETHYLENE GLYCOL 3350 17 G PO PACK: 17.0000 g | PACK | Freq: Every day | ORAL | Status: DC | PRN

## 2019-04-28 MED ORDER — AMLODIPINE BESYLATE 5 MG PO TABS: 5.0000 mg | ORAL_TABLET | Freq: Every day | ORAL | Status: DC

## 2019-04-28 MED ORDER — LORAZEPAM 0.5 MG PO TABS
0.5000 mg | ORAL_TABLET | Freq: Every day | ORAL | Status: DC
  Administered 2019-04-28: 0.5 mg via ORAL
  Filled 2019-04-28: qty 1

## 2019-04-28 MED ORDER — CALCIUM CARBONATE ANTACID 500 MG PO CHEW: 1.0000 | CHEWABLE_TABLET | Freq: Every day | ORAL | Status: DC | PRN

## 2019-04-28 MED ORDER — FENTANYL CITRATE (PF) 100 MCG/2ML IJ SOLN
25.0000 ug | INTRAMUSCULAR | Status: DC | PRN
  Administered 2019-04-28 (×2): 50 ug via INTRAVENOUS

## 2019-04-28 MED ORDER — DEXMEDETOMIDINE HCL IN NACL 200 MCG/50ML IV SOLN
INTRAVENOUS | Status: AC
  Filled 2019-04-28: qty 50

## 2019-04-28 MED ORDER — LORATADINE 10 MG PO TABS
10.0000 mg | ORAL_TABLET | Freq: Every day | ORAL | Status: DC
  Administered 2019-04-29: 10 mg via ORAL
  Filled 2019-04-28: qty 1

## 2019-04-28 MED ORDER — TRANEXAMIC ACID-NACL 1000-0.7 MG/100ML-% IV SOLN
1000.0000 mg | Freq: Once | INTRAVENOUS | Status: AC
  Administered 2019-04-28: 1000 mg via INTRAVENOUS
  Filled 2019-04-28: qty 100

## 2019-04-28 MED ORDER — BUPIVACAINE HCL (PF) 0.25 % IJ SOLN
INTRAMUSCULAR | Status: AC
  Filled 2019-04-28: qty 30

## 2019-04-28 MED ORDER — PROPOFOL 10 MG/ML IV BOLUS
INTRAVENOUS | Status: DC | PRN
  Administered 2019-04-28: 100 mg via INTRAVENOUS

## 2019-04-28 MED ORDER — METOCLOPRAMIDE HCL 5 MG PO TABS: 5.0000 mg | ORAL_TABLET | Freq: Three times a day (TID) | ORAL | Status: DC | PRN

## 2019-04-28 MED ORDER — ACETAMINOPHEN 500 MG PO TABS: 1000.0000 mg | ORAL_TABLET | Freq: Once | ORAL | Status: DC

## 2019-04-28 MED ORDER — KETOROLAC TROMETHAMINE 15 MG/ML IJ SOLN
15.0000 mg | Freq: Once | INTRAMUSCULAR | Status: AC | PRN
  Administered 2019-04-28: 15 mg via INTRAVENOUS

## 2019-04-28 MED ORDER — POVIDONE-IODINE 10 % EX SWAB: 2.0000 "application " | Freq: Once | CUTANEOUS | Status: DC

## 2019-04-28 MED ORDER — PHENOL 1.4 % MT LIQD: 1.0000 | OROMUCOSAL | Status: DC | PRN

## 2019-04-28 MED ORDER — LEVOTHYROXINE SODIUM 25 MCG PO TABS
25.0000 ug | ORAL_TABLET | Freq: Every day | ORAL | Status: DC
  Administered 2019-04-29: 06:00:00 25 ug via ORAL
  Filled 2019-04-28: qty 1

## 2019-04-28 MED ORDER — BUPIVACAINE HCL 0.25 % IJ SOLN
INTRAMUSCULAR | Status: DC | PRN
  Administered 2019-04-28: 30 mL

## 2019-04-28 MED ORDER — PHENYLEPHRINE 40 MCG/ML (10ML) SYRINGE FOR IV PUSH (FOR BLOOD PRESSURE SUPPORT)
PREFILLED_SYRINGE | INTRAVENOUS | Status: DC | PRN
  Administered 2019-04-28: 80 ug via INTRAVENOUS

## 2019-04-28 MED ORDER — CEFAZOLIN SODIUM-DEXTROSE 2-4 GM/100ML-% IV SOLN
2.0000 g | INTRAVENOUS | Status: AC
  Administered 2019-04-28: 2 g via INTRAVENOUS
  Filled 2019-04-28: qty 100

## 2019-04-28 MED ORDER — EPHEDRINE SULFATE 50 MG/ML IJ SOLN
INTRAMUSCULAR | Status: DC | PRN
  Administered 2019-04-28: 5 mg via INTRAVENOUS
  Administered 2019-04-28: 10 mg via INTRAVENOUS
  Administered 2019-04-28: 5 mg via INTRAVENOUS
  Administered 2019-04-28: 10 mg via INTRAVENOUS
  Administered 2019-04-28: 5 mg via INTRAVENOUS

## 2019-04-28 MED ORDER — DEXAMETHASONE SODIUM PHOSPHATE 10 MG/ML IJ SOLN
8.0000 mg | Freq: Once | INTRAMUSCULAR | Status: AC
  Administered 2019-04-28: 8 mg via INTRAVENOUS

## 2019-04-28 MED ORDER — SODIUM CHLORIDE 0.9 % IV SOLN
INTRAVENOUS | Status: DC
  Administered 2019-04-28: 17:00:00 via INTRAVENOUS

## 2019-04-28 MED ORDER — BISACODYL 10 MG RE SUPP: 10.0000 mg | Freq: Every day | RECTAL | Status: DC | PRN

## 2019-04-28 MED ORDER — TRANEXAMIC ACID-NACL 1000-0.7 MG/100ML-% IV SOLN
1000.0000 mg | INTRAVENOUS | Status: AC
  Administered 2019-04-28: 1000 mg via INTRAVENOUS
  Filled 2019-04-28: qty 100

## 2019-04-28 MED ORDER — ATENOLOL 50 MG PO TABS
50.0000 mg | ORAL_TABLET | Freq: Every day | ORAL | Status: DC
  Administered 2019-04-29: 50 mg via ORAL
  Filled 2019-04-28: qty 1

## 2019-04-28 MED ORDER — LIDOCAINE 2% (20 MG/ML) 5 ML SYRINGE
INTRAMUSCULAR | Status: DC | PRN
  Administered 2019-04-28: 60 mg via INTRAVENOUS

## 2019-04-28 MED ORDER — CEFAZOLIN SODIUM-DEXTROSE 2-4 GM/100ML-% IV SOLN
2.0000 g | Freq: Four times a day (QID) | INTRAVENOUS | Status: AC
  Administered 2019-04-28 (×2): 2 g via INTRAVENOUS
  Filled 2019-04-28 (×2): qty 100

## 2019-04-28 MED ORDER — ASPIRIN 325 MG PO TBEC: 325.0000 mg | DELAYED_RELEASE_TABLET | Freq: Two times a day (BID) | ORAL | 0 refills | Status: AC

## 2019-04-28 MED ORDER — ONDANSETRON HCL 4 MG/2ML IJ SOLN
4.0000 mg | Freq: Four times a day (QID) | INTRAMUSCULAR | Status: DC | PRN
  Administered 2019-04-28 (×2): 4 mg via INTRAVENOUS
  Filled 2019-04-28 (×2): qty 2

## 2019-04-28 MED ORDER — FLUTICASONE PROPIONATE 50 MCG/ACT NA SUSP
2.0000 | Freq: Two times a day (BID) | NASAL | Status: DC
  Administered 2019-04-29: 2 via NASAL
  Filled 2019-04-28: qty 16

## 2019-04-28 MED ORDER — FENTANYL CITRATE (PF) 100 MCG/2ML IJ SOLN
INTRAMUSCULAR | Status: DC | PRN
  Administered 2019-04-28 (×2): 50 ug via INTRAVENOUS
  Administered 2019-04-28: 100 ug via INTRAVENOUS
  Administered 2019-04-28 (×2): 50 ug via INTRAVENOUS

## 2019-04-28 MED ORDER — ACETAMINOPHEN 500 MG PO TABS: 500.0000 mg | ORAL_TABLET | Freq: Four times a day (QID) | ORAL | Status: DC

## 2019-04-28 MED ORDER — METHOCARBAMOL 500 MG IVPB - SIMPLE MED
500.0000 mg | Freq: Four times a day (QID) | INTRAVENOUS | Status: DC | PRN
  Administered 2019-04-28: 14:00:00 500 mg via INTRAVENOUS
  Filled 2019-04-28: qty 50

## 2019-04-28 MED ORDER — ROCURONIUM BROMIDE 50 MG/5ML IV SOSY
PREFILLED_SYRINGE | INTRAVENOUS | Status: DC | PRN
  Administered 2019-04-28: 50 mg via INTRAVENOUS

## 2019-04-28 MED ORDER — ONDANSETRON HCL 4 MG/2ML IJ SOLN
INTRAMUSCULAR | Status: DC | PRN
  Administered 2019-04-28: 4 mg via INTRAVENOUS

## 2019-04-28 MED ORDER — HYDROCODONE-ACETAMINOPHEN 5-325 MG PO TABS
1.0000 | ORAL_TABLET | ORAL | Status: DC | PRN
  Administered 2019-04-28 – 2019-04-29 (×4): 2 via ORAL
  Filled 2019-04-28 (×4): qty 2

## 2019-04-28 MED ORDER — METHOCARBAMOL 500 MG PO TABS: 500.0000 mg | ORAL_TABLET | Freq: Four times a day (QID) | ORAL | Status: DC | PRN

## 2019-04-28 MED ORDER — ONDANSETRON HCL 4 MG PO TABS: 4.0000 mg | ORAL_TABLET | Freq: Four times a day (QID) | ORAL | Status: DC | PRN

## 2019-04-28 MED ORDER — HYDROCODONE-ACETAMINOPHEN 5-325 MG PO TABS: 1.0000 | ORAL_TABLET | Freq: Four times a day (QID) | ORAL | 0 refills | Status: DC | PRN

## 2019-04-28 MED ORDER — CEPHALEXIN 500 MG PO CAPS
500.0000 mg | ORAL_CAPSULE | Freq: Three times a day (TID) | ORAL | Status: DC
  Administered 2019-04-28 – 2019-04-29 (×2): 500 mg via ORAL
  Filled 2019-04-28 (×2): qty 1

## 2019-04-28 MED ORDER — DOCUSATE SODIUM 100 MG PO CAPS
100.0000 mg | ORAL_CAPSULE | Freq: Two times a day (BID) | ORAL | Status: DC
  Administered 2019-04-28 – 2019-04-29 (×2): 100 mg via ORAL
  Filled 2019-04-28 (×2): qty 1

## 2019-04-28 MED ORDER — SERTRALINE HCL 50 MG PO TABS
50.0000 mg | ORAL_TABLET | Freq: Every day | ORAL | Status: DC
  Administered 2019-04-28: 50 mg via ORAL
  Filled 2019-04-28: qty 1

## 2019-04-28 MED ORDER — DEXMEDETOMIDINE HCL 200 MCG/2ML IV SOLN
INTRAVENOUS | Status: DC | PRN
  Administered 2019-04-28 (×2): 4 ug via INTRAVENOUS
  Administered 2019-04-28: 8 ug via INTRAVENOUS

## 2019-04-28 MED ORDER — SUGAMMADEX SODIUM 200 MG/2ML IV SOLN
INTRAVENOUS | Status: DC | PRN
  Administered 2019-04-28: 160 mg via INTRAVENOUS

## 2019-04-28 MED ORDER — WATER FOR IRRIGATION, STERILE IR SOLN
Status: DC | PRN
  Administered 2019-04-28: 2000 mL

## 2019-04-28 MED ORDER — ROCURONIUM BROMIDE 10 MG/ML (PF) SYRINGE
PREFILLED_SYRINGE | INTRAVENOUS | Status: AC
  Filled 2019-04-28: qty 10

## 2019-04-28 MED ORDER — MENTHOL 3 MG MT LOZG: 1.0000 | LOZENGE | OROMUCOSAL | Status: DC | PRN

## 2019-04-28 MED ORDER — LACTATED RINGERS IV SOLN
INTRAVENOUS | Status: DC
  Administered 2019-04-28 (×2): via INTRAVENOUS

## 2019-04-28 MED ORDER — HYDROCODONE-ACETAMINOPHEN 7.5-325 MG PO TABS: 1.0000 | ORAL_TABLET | ORAL | Status: DC | PRN

## 2019-04-28 MED ORDER — ONDANSETRON HCL 4 MG/2ML IJ SOLN: 4.0000 mg | Freq: Once | INTRAMUSCULAR | Status: DC | PRN

## 2019-04-28 MED ORDER — LIDOCAINE 2% (20 MG/ML) 5 ML SYRINGE
INTRAMUSCULAR | Status: AC
  Filled 2019-04-28: qty 5

## 2019-04-28 MED ORDER — KETOROLAC TROMETHAMINE 15 MG/ML IJ SOLN
INTRAMUSCULAR | Status: AC
  Filled 2019-04-28: qty 1

## 2019-04-28 MED ORDER — SIMVASTATIN 20 MG PO TABS: 20.0000 mg | ORAL_TABLET | Freq: Every day | ORAL | Status: DC

## 2019-04-28 SURGICAL SUPPLY — 48 items
BAG DECANTER FOR FLEXI CONT (MISCELLANEOUS) IMPLANT
BAG ZIPLOCK 12X15 (MISCELLANEOUS) IMPLANT
BLADE SAG 18X100X1.27 (BLADE) ×3 IMPLANT
CLOSURE WOUND 1/2 X4 (GAUZE/BANDAGES/DRESSINGS) ×1
COVER PERINEAL POST (MISCELLANEOUS) ×3 IMPLANT
COVER SURGICAL LIGHT HANDLE (MISCELLANEOUS) ×3 IMPLANT
COVER WAND RF STERILE (DRAPES) IMPLANT
CUP ACET PINNACLE SECTR 50MM (Hips) ×1 IMPLANT
DECANTER SPIKE VIAL GLASS SM (MISCELLANEOUS) ×3 IMPLANT
DRAPE STERI IOBAN 125X83 (DRAPES) ×3 IMPLANT
DRAPE U-SHAPE 47X51 STRL (DRAPES) ×6 IMPLANT
DRSG ADAPTIC 3X8 NADH LF (GAUZE/BANDAGES/DRESSINGS) ×3 IMPLANT
DRSG MEPILEX BORDER 4X4 (GAUZE/BANDAGES/DRESSINGS) ×3 IMPLANT
DRSG MEPILEX BORDER 4X8 (GAUZE/BANDAGES/DRESSINGS) ×3 IMPLANT
DURAPREP 26ML APPLICATOR (WOUND CARE) ×3 IMPLANT
ELECT REM PT RETURN 15FT ADLT (MISCELLANEOUS) ×3 IMPLANT
EVACUATOR 1/8 PVC DRAIN (DRAIN) ×3 IMPLANT
GLOVE BIO SURGEON STRL SZ 6 (GLOVE) IMPLANT
GLOVE BIO SURGEON STRL SZ7 (GLOVE) IMPLANT
GLOVE BIO SURGEON STRL SZ8 (GLOVE) ×3 IMPLANT
GLOVE BIOGEL PI IND STRL 6.5 (GLOVE) IMPLANT
GLOVE BIOGEL PI IND STRL 7.0 (GLOVE) IMPLANT
GLOVE BIOGEL PI IND STRL 8 (GLOVE) ×1 IMPLANT
GLOVE BIOGEL PI INDICATOR 6.5 (GLOVE)
GLOVE BIOGEL PI INDICATOR 7.0 (GLOVE)
GLOVE BIOGEL PI INDICATOR 8 (GLOVE) ×2
GOWN STRL REUS W/TWL LRG LVL3 (GOWN DISPOSABLE) ×3 IMPLANT
GOWN STRL REUS W/TWL XL LVL3 (GOWN DISPOSABLE) IMPLANT
HEAD FEM STD 32X+5 STRL (Hips) ×3 IMPLANT
HOLDER FOLEY CATH W/STRAP (MISCELLANEOUS) ×3 IMPLANT
KIT TURNOVER KIT A (KITS) IMPLANT
LINER MARATHON 32 50 (Hips) ×3 IMPLANT
MANIFOLD NEPTUNE II (INSTRUMENTS) ×3 IMPLANT
PACK ANTERIOR HIP CUSTOM (KITS) ×3 IMPLANT
PENCIL SMOKE EVACUATOR (MISCELLANEOUS) IMPLANT
PINNACLE SECTOR CUP 50MM (Hips) ×3 IMPLANT
SCREW PINN CAN 6.5X20 (Screw) ×3 IMPLANT
STEM FEMORAL SZ 5MM STD ACTIS (Stem) ×3 IMPLANT
STRIP CLOSURE SKIN 1/2X4 (GAUZE/BANDAGES/DRESSINGS) ×2 IMPLANT
SUT ETHIBOND NAB CT1 #1 30IN (SUTURE) ×3 IMPLANT
SUT MNCRL AB 4-0 PS2 18 (SUTURE) ×3 IMPLANT
SUT STRATAFIX 0 PDS 27 VIOLET (SUTURE) ×3
SUT VIC AB 2-0 CT1 27 (SUTURE) ×4
SUT VIC AB 2-0 CT1 TAPERPNT 27 (SUTURE) ×2 IMPLANT
SUTURE STRATFX 0 PDS 27 VIOLET (SUTURE) ×1 IMPLANT
SYR 50ML LL SCALE MARK (SYRINGE) IMPLANT
TRAY FOLEY MTR SLVR 16FR STAT (SET/KITS/TRAYS/PACK) ×3 IMPLANT
YANKAUER SUCT BULB TIP 10FT TU (MISCELLANEOUS) ×3 IMPLANT

## 2019-04-28 NOTE — Interval H&P Note (Signed)
History and Physical Interval Note:  04/28/2019 10:22 AM  Melissa Love  has presented today for surgery, with the diagnosis of left hip osteoarthritis.  The various methods of treatment have been discussed with the patient and family. After consideration of risks, benefits and other options for treatment, the patient has consented to  Procedure(s) with comments: [place] (Left) - 121min as a surgical intervention.  The patient's history has been reviewed, patient examined, no change in status, stable for surgery.  I have reviewed the patient's chart and labs.  Questions were answered to the patient's satisfaction.     Pilar Plate Isidro Monks

## 2019-04-28 NOTE — Evaluation (Signed)
Physical Therapy Evaluation Patient Details Name: Melissa Love MRN: [id number] DOB: [birth year] Today's Date: 04/28/2019   History of Present Illness  Pt is 83 yo female s/p L THA anterior approach on 04/28/2019. Pt with PMH including anxiety, depression, high cholesterol, HTN, OA.  Clinical Impression  Pt is s/p L anterior THA resulting in the deficits listed below (see PT Problem List). Pt was able to transfer with min A and ambulate a short distance.  She was limited by nausea.  Did spend increased time educating on DME use, transfer techniques, and HEP for tonight (ankle pumps and quad sets). Pt will benefit from skilled PT to increase their independence and safety with mobility to allow discharge to the venue listed below.      Follow Up Recommendations Follow surgeon's recommendation for DC plan and follow-up therapies    Equipment Recommendations  Rolling walker with 5" wheels;3in1 (PT)((they were delievered while PT in room))    Recommendations for Other Services       Precautions / Restrictions Precautions Precautions: Fall Restrictions Weight Bearing Restrictions: Yes LLE Weight Bearing: Weight bearing as tolerated      Mobility  Bed Mobility Overal bed mobility: Needs Assistance Bed Mobility: Supine to Sit     Supine to sit: Min assist     General bed mobility comments: required increased time, cues, and min A for L LE due to pain  Transfers Overall transfer level: Needs assistance Equipment used: Rolling walker (2 wheeled) Transfers: Sit to/from Stand Sit to Stand: Min assist;From elevated surface         General transfer comment: cues for safe hand placement  Ambulation/Gait Ambulation/Gait assistance: Min assist Gait Distance (Feet): 20 Feet Assistive device: Rolling walker (2 wheeled) Gait Pattern/deviations: Step-to pattern;Decreased stride length     General Gait Details: cued for sequence and RW use; limited by nausea  Stairs             Wheelchair Mobility    Modified Rankin (Stroke Patients Only)       Balance Overall balance assessment: Needs assistance Sitting-balance support: Bilateral upper extremity supported;Feet supported Sitting balance-Leahy Scale: Good     Standing balance support: Bilateral upper extremity supported;During functional activity Standing balance-Leahy Scale: Fair                               Pertinent Vitals/Pain Pain Assessment: 0-10 Pain Score: 4  Pain Location: L hip Pain Descriptors / Indicators: Discomfort Pain Intervention(s): Limited activity within patient's tolerance;Patient requesting pain meds-RN notified;Ice applied    Home Living Family/patient expects to be discharged to:: Private residence Living Arrangements: Alone Available Help at Discharge: Friend(s);Available 24 hours/day Type of Home: House Home Access: Stairs to enter Entrance Stairs-Rails: None Entrance Stairs-Number of Steps: 1 Home Layout: One level Home Equipment: Cane - single point      Prior Function Level of Independence: Independent with assistive device(s)         Comments: Pt used grocery cart if needed to walk around store.  Used cane in her home and to walk to mailbox but reports that had gotten difficult.     Hand Dominance        Extremity/Trunk Assessment   Upper Extremity Assessment Upper Extremity Assessment: Overall WFL for tasks assessed(does report arthritis in R shoulder -but was functional)    Lower Extremity Assessment Lower Extremity Assessment: LLE deficits/detail LLE Deficits / Details: ROM : WFL; MMT L  ankle 5/5, L knee ext 3/5 not further tested, L hip 1/5 limited by pain    Cervical / Trunk Assessment Cervical / Trunk Assessment: Normal  Communication   Communication: No difficulties  Cognition Arousal/Alertness: Awake/alert Behavior During Therapy: WFL for tasks assessed/performed Overall Cognitive Status: Within Functional Limits for  tasks assessed                                        General Comments      Exercises Total Joint Exercises Ankle Circles/Pumps: AROM;10 reps;Both Quad Sets: AROM;10 reps;Both   Assessment/Plan    PT Assessment Patient needs continued PT services  PT Problem List Decreased strength;Decreased mobility;Decreased range of motion;Decreased activity tolerance;Decreased balance;Decreased knowledge of use of DME       PT Treatment Interventions DME instruction;Therapeutic activities;Modalities;Gait training;Therapeutic exercise;Patient/family education;Stair training;Balance training;Functional mobility training    PT Goals (Current goals can be found in the Care Plan section)  Acute Rehab PT Goals Patient Stated Goal: return home PT Goal Formulation: With patient/family Time For Goal Achievement: 05/12/19 Potential to Achieve Goals: Good    Frequency 7X/week   Barriers to discharge        Co-evaluation               AM-PAC PT "6 Clicks" Mobility  Outcome Measure Help needed turning from your back to your side while in a flat bed without using bedrails?: A Little Help needed moving from lying on your back to sitting on the side of a flat bed without using bedrails?: A Little Help needed moving to and from a bed to a chair (including a wheelchair)?: A Little Help needed standing up from a chair using your arms (e.g., wheelchair or bedside chair)?: A Little Help needed to walk in hospital room?: None Help needed climbing 3-5 steps with a railing? : A Little 6 Click Score: 19    End of Session Equipment Utilized During Treatment: Gait belt Activity Tolerance: Patient tolerated treatment well Patient left: in chair;with chair alarm set;with call bell/phone within reach;with family/visitor present Nurse Communication: Mobility status(request for nausea and pain meds) PT Visit Diagnosis: Other abnormalities of gait and mobility (R26.89);Muscle weakness  (generalized) (M62.81)    Time: [id number] PT Time Calculation (min) (ACUTE ONLY): 45 min   Charges:   PT Evaluation $PT Eval Low Complexity: 1 Low PT Treatments $Therapeutic Activity: 8-22 mins        Maggie Font, PT Acute Rehab Services Pager [phone] [facility] [phone] [facility] [phone]   Karlton Lemon 04/28/2019, 4:39 PM

## 2019-04-28 NOTE — Anesthesia Postprocedure Evaluation (Signed)
Anesthesia Post Note  Patient: Melissa Love  Procedure(s) Performed: TOTAL HIP ARTHROPLASTY ANTERIOR APPROACH (Left Hip)     Patient location during evaluation: PACU Anesthesia Type: General Level of consciousness: awake and alert, oriented and patient cooperative Pain management: pain level controlled Vital Signs Assessment: post-procedure vital signs reviewed and stable Respiratory status: spontaneous breathing, nonlabored ventilation and respiratory function stable Cardiovascular status: blood pressure returned to baseline and stable Postop Assessment: no apparent nausea or vomiting Anesthetic complications: no    Last Vitals:  Vitals:   04/28/19 1400 04/28/19 1415  BP: (!) 148/72 (!) 148/65  Pulse: 78 73  Resp: 12 14  Temp:    SpO2: 98% 100%    Last Pain:  Vitals:   04/28/19 1415  TempSrc:   PainSc: [place]

## 2019-04-28 NOTE — Transfer of Care (Signed)
Immediate Anesthesia Transfer of Care Note  Patient: Melissa Love  Procedure(s) Performed: TOTAL HIP ARTHROPLASTY ANTERIOR APPROACH (Left Hip)  Patient Location: PACU  Anesthesia Type:General  Level of Consciousness: awake, alert , oriented and patient cooperative  Airway & Oxygen Therapy: Patient Spontanous Breathing and Patient connected to face mask oxygen  Post-op Assessment: Report given to RN and Post -op Vital signs reviewed and stable  Post vital signs: Reviewed and stable  Last Vitals:  Vitals Value Taken Time  BP 139/82 04/28/19 1336  Temp    Pulse 80 04/28/19 1343  Resp 15 04/28/19 1343  SpO2 100 % 04/28/19 1343  Vitals shown include unvalidated device data.  Last Pain:  Vitals:   04/28/19 1032  TempSrc: Oral  PainSc: 6       Patients Stated Pain Goal: 4 ([id number])  Complications: No apparent anesthesia complications

## 2019-04-28 NOTE — Op Note (Signed)
OPERATIVE REPORT- TOTAL HIP ARTHROPLASTY   PREOPERATIVE DIAGNOSIS: Osteoarthritis of the Left hip.   POSTOPERATIVE DIAGNOSIS: Osteoarthritis of the Left  hip.   PROCEDURE: Left total hip arthroplasty, anterior approach.   SURGEON: Gaynelle Arabian, MD   ASSISTANT: Theresa Duty, PA-C  ANESTHESIA:  General  ESTIMATED BLOOD LOSS:-400 mL    DRAINS: Hemovac x1.   COMPLICATIONS: None   CONDITION: PACU - hemodynamically stable.   BRIEF CLINICAL NOTE: Melissa Love is a 83 y.o. female who has advanced end-  stage arthritis of their Left  hip with progressively worsening pain and  dysfunction.The patient has failed nonoperative management and presents for  total hip arthroplasty.   PROCEDURE IN DETAIL: After successful administration of spinal  anesthetic, the traction boots for the [facility] bed were placed on both  feet and the patient was placed onto the [facility] bed, boots placed into the leg  holders. The Left hip was then isolated from the perineum with plastic  drapes and prepped and draped in the usual sterile fashion. ASIS and  greater trochanter were marked and a oblique incision was made, starting  at about 1 cm lateral and 2 cm distal to the ASIS and coursing towards  the anterior cortex of the femur. The skin was cut with a 10 blade  through subcutaneous tissue to the level of the fascia overlying the  tensor fascia lata muscle. The fascia was then incised in line with the  incision at the junction of the anterior third and posterior 2/3rd. The  muscle was teased off the fascia and then the interval between the TFL  and the rectus was developed. The Hohmann retractor was then placed at  the top of the femoral neck over the capsule. The vessels overlying the  capsule were cauterized and the fat on top of the capsule was removed.  A Hohmann retractor was then placed anterior underneath the rectus  femoris to give exposure to the entire anterior capsule. A T-shaped   capsulotomy was performed. The edges were tagged and the femoral head  was identified.       Osteophytes are removed off the superior acetabulum.  The femoral neck was then cut in situ with an oscillating saw. Traction  was then applied to the left lower extremity utilizing the [facility]  traction. The femoral head was then removed. Retractors were placed  around the acetabulum and then circumferential removal of the labrum was  performed. Osteophytes were also removed. Reaming starts at 47 mm to  medialize and  Increased in 2 mm increments to 49 mm. We reamed in  approximately 40 degrees of abduction, 20 degrees anteversion. A 50 mm  pinnacle acetabular shell was then impacted in anatomic position under  fluoroscopic guidance with excellent purchase. We did not need to place  any additional dome screws. A 32 mm neutral + 4 marathon liner was then  placed into the acetabular shell.       The femoral lift was then placed along the lateral aspect of the femur  just distal to the vastus ridge. The leg was  externally rotated and capsule  was stripped off the inferior aspect of the femoral neck down to the  level of the lesser trochanter, this was done with electrocautery. The femur was lifted after this was performed. The  leg was then placed in an extended and adducted position essentially delivering the femur. We also removed the capsule superiorly and the piriformis from the piriformis  fossa to gain excellent exposure of the  proximal femur. Rongeur was used to remove some cancellous bone to get  into the lateral portion of the proximal femur for placement of the  initial starter reamer. The starter broaches was placed  the starter broach  and was shown to go down the center of the canal. Broaching  with the Actis system was then performed starting at size 0  coursing  Up to size 5. A size 5 had excellent torsional and rotational  and axial stability. The trial standard offset neck was then  placed  with a 32 +5 trial head. The hip was then reduced. We confirmed that  the stem was in the canal both on AP and lateral x-rays. It also has excellent sizing. The hip was reduced with outstanding stability through full extension and full external rotation.. AP pelvis was taken and the leg lengths were measured and found to be equal. Hip was then dislocated again and the femoral head and neck removed. The  femoral broach was removed. Size 5 Actis stem with a standard offset  neck was then impacted into the femur following native anteversion. Has  excellent purchase in the canal. Excellent torsional and rotational and  axial stability. It is confirmed to be in the canal on AP and lateral  fluoroscopic views. The 32 + 5 ceramic head was placed and the hip  reduced with outstanding stability. Again AP pelvis was taken and it  confirmed that the leg lengths were equal. The wound was then copiously  irrigated with saline solution and the capsule reattached and repaired  with Ethibond suture. 30 ml of .25% Bupivicaine was  injected into the capsule and into the edge of the tensor fascia lata as well as subcutaneous tissue. The fascia overlying the tensor fascia lata was then closed with a running #1 V-Loc. Subcu was closed with interrupted 2-0 Vicryl and subcuticular running 4-0 Monocryl. Incision was cleaned  and dried. Steri-Strips and a bulky sterile dressing applied. Hemovac  drain was hooked to suction and then the patient was awakened and transported to  recovery in stable condition.        Please note that a surgical assistant was a medical necessity for this procedure to perform it in a safe and expeditious manner. Assistant was necessary to provide appropriate retraction of vital neurovascular structures and to prevent femoral fracture and allow for anatomic placement of the prosthesis.  Gaynelle Arabian, M.D.

## 2019-04-28 NOTE — Discharge Instructions (Signed)
°Dr. Frank Aluisio °Total Joint Specialist °[facility] °[address] °[place], [place] [address] °[phone] ° °ANTERIOR APPROACH TOTAL HIP REPLACEMENT POSTOPERATIVE DIRECTIONS ° ° °Hip Rehabilitation, Guidelines Following Surgery  °The results of a hip operation are greatly improved after range of motion and muscle strengthening exercises. Follow all safety measures which are given to protect your hip. If any of these exercises cause increased pain or swelling in your joint, decrease the amount until you are comfortable again. Then slowly increase the exercises. Call your caregiver if you have problems or questions.  ° °HOME CARE INSTRUCTIONS  °• Remove items at home which could result in a fall. This includes throw rugs or furniture in walking pathways.  °· ICE to the affected hip every three hours for 30 minutes at a time and then as needed for pain and swelling.  Continue to use ice on the hip for pain and swelling from surgery. You may notice swelling that will progress down to the foot and ankle.  This is normal after surgery.  Elevate the leg when you are not up walking on it.   °· Continue to use the breathing machine which will help keep your temperature down.  It is common for your temperature to cycle up and down following surgery, especially at night when you are not up moving around and exerting yourself.  The breathing machine keeps your lungs expanded and your temperature down. ° °DIET °You may resume your previous home diet once your are discharged from the hospital. ° °DRESSING / WOUND CARE / SHOWERING °You may change your dressing 3-5 days after surgery.  Then change the dressing every day with sterile gauze.  Please use good hand washing techniques before changing the dressing.  Do not use any lotions or creams on the incision until instructed by your surgeon. °You may start showering once you are discharged home but do not submerge the incision under water. Just pat the  incision dry and apply a dry gauze dressing on daily. °Change the surgical dressing daily and reapply a dry dressing each time. ° °ACTIVITY °Walk with your walker as instructed. °Use walker as long as suggested by your caregivers. °Avoid periods of inactivity such as sitting longer than an hour when not asleep. This helps prevent blood clots.  °You may resume a sexual relationship in one month or when given the OK by your doctor.  °You may return to work once you are cleared by your doctor.  °Do not drive a car for 6 weeks or until released by you surgeon.  °Do not drive while taking narcotics. ° °WEIGHT BEARING °Weight bearing as tolerated with assist device (walker, cane, etc) as directed, use it as long as suggested by your surgeon or therapist, typically at least 4-6 weeks. ° °POSTOPERATIVE CONSTIPATION PROTOCOL °Constipation - defined medically as fewer than three stools per week and severe constipation as less than one stool per week. ° °One of the most common issues patients have following surgery is constipation.  Even if you have a regular bowel pattern at home, your normal regimen is likely to be disrupted due to multiple reasons following surgery.  Combination of anesthesia, postoperative narcotics, change in appetite and fluid intake all can affect your bowels.  In order to avoid complications following surgery, here are some recommendations in order to help you during your recovery period. ° °Colace (docusate) - Pick up an over-the-counter form of Colace or another stool softener and take twice a day   as long as you are requiring postoperative pain medications.  Take with a full glass of water daily.  If you experience loose stools or diarrhea, hold the colace until you stool forms back up.  If your symptoms do not get better within 1 week or if they get worse, check with your doctor. ° °Dulcolax (bisacodyl) - Pick up over-the-counter and take as directed by the product packaging as needed to assist with  the movement of your bowels.  Take with a full glass of water.  Use this product as needed if not relieved by Colace only.  ° °MiraLax (polyethylene glycol) - Pick up over-the-counter to have on hand.  MiraLax is a solution that will increase the amount of water in your bowels to assist with bowel movements.  Take as directed and can mix with a glass of water, juice, soda, coffee, or tea.  Take if you go more than two days without a movement. °Do not use MiraLax more than once per day. Call your doctor if you are still constipated or irregular after using this medication for 7 days in a row. ° °If you continue to have problems with postoperative constipation, please contact the office for further assistance and recommendations.  If you experience "the worst abdominal pain ever" or develop nausea or vomiting, please contact the office immediatly for further recommendations for treatment. ° °ITCHING ° If you experience itching with your medications, try taking only a single pain pill, or even half a pain pill at a time.  You can also use Benadryl over the counter for itching or also to help with sleep.  ° °TED HOSE STOCKINGS °Wear the elastic stockings on both legs for three weeks following surgery during the day but you may remove then at night for sleeping. ° °MEDICATIONS °See your medication summary on the “After Visit Summary” that the nursing staff will review with you prior to discharge.  You may have some home medications which will be placed on hold until you complete the course of blood thinner medication.  It is important for you to complete the blood thinner medication as prescribed by your surgeon.  Continue your approved medications as instructed at time of discharge. ° °PRECAUTIONS °If you experience chest pain or shortness of breath - call 911 immediately for transfer to the hospital emergency department.  °If you develop a fever greater that 101 F, purulent drainage from wound, increased redness or  drainage from wound, foul odor from the wound/dressing, or calf pain - CONTACT YOUR SURGEON.   °                                                °FOLLOW-UP APPOINTMENTS °Make sure you keep all of your appointments after your operation with your surgeon and caregivers. You should call the office at the above phone number and make an appointment for approximately two weeks after the date of your surgery or on the date instructed by your surgeon outlined in the "After Visit Summary". ° °RANGE OF MOTION AND STRENGTHENING EXERCISES  °These exercises are designed to help you keep full movement of your hip joint. Follow your caregiver's or physical therapist's instructions. Perform all exercises about fifteen times, three times per day or as directed. Exercise both hips, even if you have had only one joint replacement. These exercises can be done on   a training (exercise) mat, on the floor, on a table or on a bed. Use whatever works the best and is most comfortable for you. Use music or television while you are exercising so that the exercises are a pleasant break in your day. This will make your life better with the exercises acting as a break in routine you can look forward to.   Lying on your back, slowly slide your foot toward your buttocks, raising your knee up off the floor. Then slowly slide your foot back down until your leg is straight again.   Lying on your back spread your legs as far apart as you can without causing discomfort.   Lying on your side, raise your upper leg and foot straight up from the floor as far as is comfortable. Slowly lower the leg and repeat.   Lying on your back, tighten up the muscle in the front of your thigh (quadriceps muscles). You can do this by keeping your leg straight and trying to raise your heel off the floor. This helps strengthen the largest muscle supporting your knee.   Lying on your back, tighten up the muscles of your buttocks both with the legs straight and with  the knee bent at a comfortable angle while keeping your heel on the floor.   IF YOU ARE TRANSFERRED TO A SKILLED REHAB FACILITY If the patient is transferred to a skilled rehab facility following release from the hospital, a list of the current medications will be sent to the facility for the patient to continue.  When discharged from the skilled rehab facility, please have the facility set up the patient's [place] prior to being released. Also, the skilled facility will be responsible for providing the patient with their medications at time of release from the facility to include their pain medication, the muscle relaxants, and their blood thinner medication. If the patient is still at the rehab facility at time of the two week follow up appointment, the skilled rehab facility will also need to assist the patient in arranging follow up appointment in our office and any transportation needs.  MAKE SURE YOU:   Understand these instructions.   Get help right away if you are not doing well or get worse.    Pick up stool softner and laxative for home use following surgery while on pain medications. Do not submerge incision under water. Please use good hand washing techniques while changing dressing each day. May shower starting three days after surgery. Please use a clean towel to pat the incision dry following showers. Continue to use ice for pain and swelling after surgery. Do not use any lotions or creams on the incision until instructed by your surgeon.

## 2019-04-28 NOTE — Anesthesia Procedure Notes (Signed)
Procedure Name: Intubation Date/Time: 04/28/2019 11:45 AM Performed by: West Pugh, CRNA Pre-anesthesia Checklist: Patient identified, Emergency Drugs available, Suction available, Patient being monitored and Timeout performed Patient Re-evaluated:Patient Re-evaluated prior to induction Oxygen Delivery Method: Circle system utilized Preoxygenation: Pre-oxygenation with 100% oxygen Induction Type: IV induction Ventilation: Mask ventilation without difficulty Laryngoscope Size: Mac and 4 Grade View: Grade II Tube type: Oral Tube size: 7.0 mm Number of attempts: 1 Airway Equipment and Method: Stylet Placement Confirmation: ETT inserted through vocal cords under direct vision,  positive ETCO2,  CO2 detector and breath sounds checked- equal and bilateral Secured at: 21 cm Tube secured with: Tape Dental Injury: Teeth and Oropharynx as per pre-operative assessment

## 2019-04-28 NOTE — TOC Progression Note (Signed)
Transition of Care [facility]) - Progression Note    Patient Details  Name: Melissa Love MRN: [id number] Date of Birth: 03-19-1936  Transition of Care [facility]) CM/SW [place], LCSW Phone Number: 04/28/2019, 4:05 PM  Clinical Narrative:    Therapy Plan: HEP RW and 3 IN 1 delivered to room by [place]   Expected Discharge Plan: Home/Self Care(HEP) Barriers to Discharge: No Barriers Identified  Expected Discharge Plan and Services Expected Discharge Plan: Home/Self Care(HEP)         Expected Discharge Date: 04/28/19               DME Arranged: 3-N-1, Walker rolling DME Agency: [employer] Date DME Agency Contacted: 04/28/19 Time DME Agency [id number] Representative spoke with at DME Agency: [place] ([place]) Interventions    Readmission Risk Interventions No flowsheet data found.

## 2019-04-29 DIAGNOSIS — M1612 Unilateral primary osteoarthritis, left hip: Secondary | ICD-10-CM | POA: Diagnosis not present

## 2019-04-29 LAB — CBC
HCT: 35.6 % — ABNORMAL LOW (ref 36.0–46.0)
Hemoglobin: 11 g/dL — ABNORMAL LOW (ref 12.0–15.0)
MCH: 30.2 pg (ref 26.0–34.0)
MCHC: 30.9 g/dL (ref 30.0–36.0)
MCV: 97.8 fL (ref 80.0–100.0)
Platelets: 174 10*3/uL (ref 150–400)
RBC: 3.64 MIL/uL — ABNORMAL LOW (ref 3.87–5.11)
RDW: 14 % (ref 11.5–15.5)
WBC: 10.2 10*3/uL (ref 4.0–10.5)
nRBC: 0 % (ref 0.0–0.2)

## 2019-04-29 LAB — BASIC METABOLIC PANEL
Anion gap: 8 (ref 5–15)
BUN: 11 mg/dL (ref 8–23)
CO2: 24 mmol/L (ref 22–32)
Calcium: 8.4 mg/dL — ABNORMAL LOW (ref 8.9–10.3)
Chloride: 106 mmol/L (ref 98–111)
Creatinine, Ser: 0.66 mg/dL (ref 0.44–1.00)
GFR calc Af Amer: >60 mL/min (ref >60)
GFR calc non Af Amer: >60 mL/min (ref >60)
Glucose, Bld: 131 mg/dL — ABNORMAL HIGH (ref 70–99)
Potassium: 4.4 mmol/L (ref 3.5–5.1)
Sodium: 138 mmol/L (ref 135–145)

## 2019-04-29 MED ORDER — ONDANSETRON HCL 4 MG PO TABS: 4.0000 mg | ORAL_TABLET | Freq: Four times a day (QID) | ORAL | 0 refills | Status: DC | PRN

## 2019-04-29 NOTE — Progress Notes (Signed)
Physical Therapy Treatment Patient Details Name: Melissa Love MRN: [id number] DOB: [birth year] Today's Date: 04/29/2019    History of Present Illness Pt is 83 yo female s/p L THA anterior approach on 04/28/2019. Pt with PMH including anxiety, depression, high cholesterol, HTN, OA.    PT Comments    Pt continues to improve with balance and mobility but states "I'm not doing all that stuff, it just wears me out".  Pt ambulated short distance to hall to practice single step.  Pt verbally reviewed car transfers and home therex with written instruction provided.    Follow Up Recommendations  Follow surgeon's recommendation for DC plan and follow-up therapies     Equipment Recommendations  Rolling walker with 5" wheels;3in1 (PT)    Recommendations for Other Services       Precautions / Restrictions Precautions Precautions: Fall Restrictions Weight Bearing Restrictions: No LLE Weight Bearing: Weight bearing as tolerated    Mobility  Bed Mobility Overal bed mobility: Needs Assistance Bed Mobility: Supine to Sit     Supine to sit: Min assist     General bed mobility comments: Pt sitting on EOB and requests to chair at end of session  Transfers Overall transfer level: Needs assistance Equipment used: Rolling walker (2 wheeled) Transfers: Sit to/from Stand Sit to Stand: Min guard;Supervision         General transfer comment: cues for safe hand placement  Ambulation/Gait Ambulation/Gait assistance: Min Gaffer (Feet): 45 Feet Assistive device: Rolling walker (2 wheeled) Gait Pattern/deviations: Step-to pattern;Decreased stride length;Shuffle;Trunk flexed Gait velocity: decr   General Gait Details: cues for sequence, posture, and position from RW; distance ltd by fatigue   Stairs Stairs: Yes Stairs assistance: Min assist Stair Management: No rails;Step to pattern;Forwards;With walker Number of Stairs: 1 General stair comments: cues for  sequence and foot/RW placement.  Pt declines to attempt second time.  Written instruction provided   Wheelchair Mobility    Modified Rankin (Stroke Patients Only)       Balance Overall balance assessment: Needs assistance Sitting-balance support: Feet supported;No upper extremity supported Sitting balance-Leahy Scale: Good     Standing balance support: No upper extremity supported;During functional activity Standing balance-Leahy Scale: Fair                              Cognition Arousal/Alertness: Awake/alert Behavior During Therapy: WFL for tasks assessed/performed Overall Cognitive Status: Within Functional Limits for tasks assessed                                        Exercises Total Joint Exercises Ankle Circles/Pumps: AROM;10 reps;Both Quad Sets: AROM;10 reps;Both Heel Slides: AROM;Left;20 reps;Supine Hip ABduction/ADduction: AAROM;Left;15 reps;Supine Long Arc Quad: AROM;Left;10 reps;Seated    General Comments        Pertinent Vitals/Pain Pain Assessment: 0-10 Pain Score: 5  Pain Location: L hip Pain Descriptors / Indicators: Aching;Sore Pain Intervention(s): Limited activity within patient's tolerance;Monitored during session;Premedicated before session;Ice applied    Home Living                      Prior Function            PT Goals (current goals can now be found in the care plan section) Acute Rehab PT Goals Patient Stated Goal: return home PT Goal Formulation: With patient/family Time  For Goal Achievement: 05/12/19 Potential to Achieve Goals: Good Progress towards PT goals: Progressing toward goals    Frequency    7X/week      PT Plan Current plan remains appropriate    Co-evaluation              AM-PAC PT "6 Clicks" Mobility   Outcome Measure  Help needed turning from your back to your side while in a flat bed without using bedrails?: A Little Help needed moving from lying on your  back to sitting on the side of a flat bed without using bedrails?: A Little Help needed moving to and from a bed to a chair (including a wheelchair)?: A Little Help needed standing up from a chair using your arms (e.g., wheelchair or bedside chair)?: A Little Help needed to walk in hospital room?: A Little Help needed climbing 3-5 steps with a railing? : A Little 6 Click Score: 18    End of Session Equipment Utilized During Treatment: Gait belt Activity Tolerance: Patient tolerated treatment well;Patient limited by fatigue Patient left: in chair;with chair alarm set;with call bell/phone within reach Nurse Communication: Mobility status PT Visit Diagnosis: Other abnormalities of gait and mobility (R26.89);Muscle weakness (generalized) (M62.81)     Time: [id number] PT Time Calculation (min) (ACUTE ONLY): 34 min  Charges:  $Gait Training: 8-22 mins $Therapeutic Exercise: 8-22 mins $Therapeutic Activity: 8-22 mins                     [name] PT Acute Rehabilitation Services Pager [phone] Office [phone]    Audrie Kuri 04/29/2019, 3:04 PM

## 2019-04-29 NOTE — Progress Notes (Signed)
Physical Therapy Treatment Patient Details Name: Melissa Love MRN: [id number] DOB: 01-26-1936 Today's Date: 04/29/2019    History of Present Illness Pt is 83 yo female s/p L THA anterior approach on 04/28/2019. Pt with PMH including anxiety, depression, high cholesterol, HTN, OA.    PT Comments    Pt cooperative and progressing with mobility but slowly 2* fatigue/nausea.  Will follow in pm.  Follow Up Recommendations  Follow surgeon's recommendation for DC plan and follow-up therapies     Equipment Recommendations  Rolling walker with 5" wheels;3in1 (PT)    Recommendations for Other Services       Precautions / Restrictions Precautions Precautions: Fall Restrictions Weight Bearing Restrictions: No LLE Weight Bearing: Weight bearing as tolerated    Mobility  Bed Mobility Overal bed mobility: Needs Assistance Bed Mobility: Supine to Sit     Supine to sit: Min assist     General bed mobility comments: required increased time, cues, min A for L LE due to pain and use of bedrail  Transfers Overall transfer level: Needs assistance Equipment used: Rolling walker (2 wheeled) Transfers: Sit to/from Stand Sit to Stand: Min assist;From elevated surface         General transfer comment: cues for safe hand placement  Ambulation/Gait Ambulation/Gait assistance: Min assist Gait Distance (Feet): 64 Feet Assistive device: Rolling walker (2 wheeled) Gait Pattern/deviations: Step-to pattern;Decreased stride length Gait velocity: decr   General Gait Details: cues for sequence, posture, and position from RW; distance ltd by nausea/fatigue   Stairs             Wheelchair Mobility    Modified Rankin (Stroke Patients Only)       Balance Overall balance assessment: Needs assistance Sitting-balance support: Feet supported;No upper extremity supported Sitting balance-Leahy Scale: Good     Standing balance support: Bilateral upper extremity supported;During  functional activity Standing balance-Leahy Scale: Fair                              Cognition Arousal/Alertness: Awake/alert Behavior During Therapy: WFL for tasks assessed/performed Overall Cognitive Status: Within Functional Limits for tasks assessed                                        Exercises Total Joint Exercises Ankle Circles/Pumps: AROM;10 reps;Both Quad Sets: AROM;10 reps;Both Heel Slides: AROM;Left;20 reps;Supine Hip ABduction/ADduction: AAROM;Left;15 reps;Supine    General Comments        Pertinent Vitals/Pain Pain Assessment: 0-10 Pain Score: 6  Pain Location: L hip Pain Descriptors / Indicators: Aching;Sore Pain Intervention(s): Limited activity within patient's tolerance;Monitored during session;Premedicated before session;Ice applied    Home Living                      Prior Function            PT Goals (current goals can now be found in the care plan section) Acute Rehab PT Goals Patient Stated Goal: return home PT Goal Formulation: With patient/family Time For Goal Achievement: 05/12/19 Potential to Achieve Goals: Good Progress towards PT goals: Progressing toward goals    Frequency    7X/week      PT Plan Current plan remains appropriate    Co-evaluation              AM-PAC PT "6 Clicks" Mobility  Outcome Measure  Help needed turning from your back to your side while in a flat bed without using bedrails?: A Little Help needed moving from lying on your back to sitting on the side of a flat bed without using bedrails?: A Little Help needed moving to and from a bed to a chair (including a wheelchair)?: A Little Help needed standing up from a chair using your arms (e.g., wheelchair or bedside chair)?: A Little Help needed to walk in hospital room?: A Little Help needed climbing 3-5 steps with a railing? : A Little 6 Click Score: 18    End of Session Equipment Utilized During Treatment: Gait  belt Activity Tolerance: Patient tolerated treatment well Patient left: in chair;with chair alarm set;with call bell/phone within reach Nurse Communication: Mobility status PT Visit Diagnosis: Other abnormalities of gait and mobility (R26.89);Muscle weakness (generalized) (M62.81)     Time: 0810-0900 PT Time Calculation (min) (ACUTE ONLY): 50 min  Charges:  $Gait Training: 23-37 mins $Therapeutic Exercise: 8-22 mins                     [name] PT Acute Rehabilitation Services Pager [phone] Office [phone]    [name] 04/29/2019, 1:04 PM

## 2019-04-29 NOTE — Progress Notes (Signed)
   Subjective: 1 Day Post-Op Procedure(s) (LRB): TOTAL HIP ARTHROPLASTY ANTERIOR APPROACH (Left) Patient reports pain as mild.   Had nausea yesterday which is better today  Plan is to go Home after hospital stay.  Objective: Vital signs in last 24 hours: Temp:  [97.1 F (36.2 C)-98.5 F (36.9 C)] 98.3 F (36.8 C) (11/26 0516) Pulse Rate:  [65-86] 84 (11/26 0516) Resp:  [8-20] 20 (11/26 0516) BP: (113-150)/(53-82) 117/53 (11/26 0516) SpO2:  [95 %-100 %] 96 % (11/26 0516) Weight:  [78.6 kg] 78.6 kg (11/25 1002)  Intake/Output from previous day:  Intake/Output Summary (Last 24 hours) at 04/29/2019 0829 Last data filed at 04/29/2019 0818 Gross per 24 hour  Intake 4654.14 ml  Output 2385 ml  Net 2269.14 ml    Intake/Output this shift: Total I/O In: 100 [P.O.:100] Out: -   Labs: Recent Labs    04/26/19 1235 04/29/19 0249  HGB 15.1* 11.0*   Recent Labs    04/26/19 1235 04/29/19 0249  WBC 9.4 10.2  RBC 4.94 3.64*  HCT 47.8* 35.6*  PLT 222 174   Recent Labs    04/26/19 1235 04/29/19 0249  NA 140 138  K 4.4 4.4  CL 104 106  CO2 27 24  BUN 19 11  CREATININE 0.73 0.66  GLUCOSE 97 131*  CALCIUM 9.3 8.4*   Recent Labs    04/26/19 1235  INR 1.0    EXAM General - Patient is Alert, Appropriate and Oriented Extremity - Neurologically intact Neurovascular intact No cellulitis present Compartment soft Dressing - dressing C/D/I Motor Function - intact, moving foot and toes well on exam.  Hemovac pulled without difficulty.  Past Medical History:  Diagnosis Date  . Anxiety   . Arthritis   . Headache    migraines but none since 15 years  . Hypertension   . Hypothyroidism   . Macular degeneration of left eye    injections every month    Assessment/Plan: 1 Day Post-Op Procedure(s) (LRB): TOTAL HIP ARTHROPLASTY ANTERIOR APPROACH (Left) Principal Problem:   OA (osteoarthritis) of hip Active Problems:   Osteoarthritis of left hip   Advance  diet Up with therapy D/C IV fluids Discharge home after PT  DVT Prophylaxis - Aspirin Weight Bearing As Tolerated left Leg Hemovac Pulled   Pilar Plate Daruis Swaim

## 2019-04-29 NOTE — Progress Notes (Signed)
Patient discharged to home w/ family. Given all belongings, instructions, equipment. Verbalized understanding of all instructions. Escorted to pov via w/c. 

## 2019-05-01 DIAGNOSIS — Z87440 Personal history of urinary (tract) infections: Secondary | ICD-10-CM | POA: Diagnosis not present

## 2019-05-01 DIAGNOSIS — Z7982 Long term (current) use of aspirin: Secondary | ICD-10-CM | POA: Diagnosis not present

## 2019-05-01 DIAGNOSIS — Z471 Aftercare following joint replacement surgery: Secondary | ICD-10-CM | POA: Diagnosis not present

## 2019-05-01 DIAGNOSIS — F329 Major depressive disorder, single episode, unspecified: Secondary | ICD-10-CM | POA: Diagnosis not present

## 2019-05-01 DIAGNOSIS — Z96642 Presence of left artificial hip joint: Secondary | ICD-10-CM | POA: Diagnosis not present

## 2019-05-01 DIAGNOSIS — I10 Essential (primary) hypertension: Secondary | ICD-10-CM | POA: Diagnosis not present

## 2019-05-01 DIAGNOSIS — F419 Anxiety disorder, unspecified: Secondary | ICD-10-CM | POA: Diagnosis not present

## 2019-05-01 DIAGNOSIS — M1990 Unspecified osteoarthritis, unspecified site: Secondary | ICD-10-CM | POA: Diagnosis not present

## 2019-05-01 DIAGNOSIS — E049 Nontoxic goiter, unspecified: Secondary | ICD-10-CM | POA: Diagnosis not present

## 2019-05-01 DIAGNOSIS — E785 Hyperlipidemia, unspecified: Secondary | ICD-10-CM | POA: Diagnosis not present

## 2019-05-03 ENCOUNTER — Encounter (HOSPITAL_COMMUNITY): Payer: Self-pay | Admitting: Orthopedic Surgery

## 2019-05-03 NOTE — Discharge Summary (Signed)
Physician Discharge Summary   Patient ID: Melissa Love MRN: [id number] DOB/AGE: February 25, 1936 83 y.o.  Admit date: 04/28/2019 Discharge date: 04/29/2019  Primary Diagnosis: Osteoarthritis, left hip   Admission Diagnoses:  Past Medical History:  Diagnosis Date  . Anxiety   . Arthritis   . Headache    migraines but none since 15 years  . Hypertension   . Hypothyroidism   . Macular degeneration of left eye    injections every month   Discharge Diagnoses:   Principal Problem:   OA (osteoarthritis) of hip Active Problems:   Osteoarthritis of left hip  Estimated body mass index is 31.68 kg/m as calculated from the following:   Height as of this encounter: 5\' 2"  (1.575 m).   Weight as of this encounter: 78.6 kg.  Procedure:  Procedure(s) (LRB): TOTAL HIP ARTHROPLASTY ANTERIOR APPROACH (Left)   Consults: None  HPI: Melissa Love is a 83 y.o. female who has advanced end-stage arthritis of their Left  hip with progressively worsening pain and dysfunction.The patient has failed nonoperative management and presents for total hip arthroplasty.   Laboratory Data: Admission on 04/28/2019, Discharged on 04/29/2019  Component Date Value Ref Range Status  . WBC 04/29/2019 10.2  4.0 - 10.5 K/uL Final  . RBC 04/29/2019 3.64* 3.87 - 5.11 MIL/uL Final  . Hemoglobin 04/29/2019 11.0* 12.0 - 15.0 g/dL Final  . HCT 04/29/2019 35.6* 36.0 - 46.0 % Final  . MCV 04/29/2019 97.8  80.0 - 100.0 fL Final  . MCH 04/29/2019 30.2  26.0 - 34.0 pg Final  . MCHC 04/29/2019 30.9  30.0 - 36.0 g/dL Final  . RDW 04/29/2019 14.0  11.5 - 15.5 % Final  . Platelets 04/29/2019 174  150 - 400 K/uL Final  . nRBC 04/29/2019 0.0  0.0 - 0.2 % Final   Performed at [facility], [place] [address] [place], [place] [address]  . Sodium 04/29/2019 138  135 - 145 mmol/L Final  . Potassium 04/29/2019 4.4  3.5 - 5.1 mmol/L Final  . Chloride 04/29/2019 106  98 - 111 mmol/L Final  . CO2 04/29/2019 24  22  - 32 mmol/L Final  . Glucose, Bld 04/29/2019 131* 70 - 99 mg/dL Final  . BUN 04/29/2019 11  8 - 23 mg/dL Final  . Creatinine, Ser 04/29/2019 0.66  0.44 - 1.00 mg/dL Final  . Calcium 04/29/2019 8.4* 8.9 - 10.3 mg/dL Final  . GFR calc non Af Amer 04/29/2019 >60  >60 mL/min Final  . GFR calc Af Amer 04/29/2019 >60  >60 mL/min Final  . Anion gap 04/29/2019 8  5 - 15 Final   Performed at [facility], [place] [address] [place], [place] [address]  Hospital Outpatient Visit on 04/26/2019  Component Date Value Ref Range Status  . MRSA, PCR 04/26/2019 NEGATIVE  NEGATIVE Final  . Staphylococcus aureus 04/26/2019 NEGATIVE  NEGATIVE Final   Comment: (NOTE) The Xpert SA Assay (FDA approved for NASAL specimens in patients 59 years of age and older), is one component of a comprehensive surveillance program. It is not intended to diagnose infection nor to guide or monitor treatment. Performed at [facility], [place] [address], [place], [place] [address]   . aPTT 04/26/2019 28  24 - 36 seconds Final   Performed at [facility], [place] [address] [place], [place] [address]  . WBC 04/26/2019 9.4  4.0 - 10.5 K/uL Final  . RBC 04/26/2019 4.94  3.87 - 5.11  MIL/uL Final  . Hemoglobin 04/26/2019 15.1* 12.0 - 15.0 g/dL Final  . HCT 04/26/2019 47.8* 36.0 - 46.0 % Final  . MCV 04/26/2019 96.8  80.0 - 100.0 fL Final  . MCH 04/26/2019 30.6  26.0 - 34.0 pg Final  . MCHC 04/26/2019 31.6  30.0 - 36.0 g/dL Final  . RDW 04/26/2019 14.3  11.5 - 15.5 % Final  . Platelets 04/26/2019 222  150 - 400 K/uL Final  . nRBC 04/26/2019 0.0  0.0 - 0.2 % Final  . Neutrophils Relative % 04/26/2019 65  % Final  . Neutro Abs 04/26/2019 6.2  1.7 - 7.7 K/uL Final  . Lymphocytes Relative 04/26/2019 25  % Final  . Lymphs Abs 04/26/2019 2.3  0.7 - 4.0 K/uL Final  . Monocytes Relative 04/26/2019 7  % Final  . Monocytes Absolute 04/26/2019 0.7  0.1 - 1.0 K/uL Final  . Eosinophils  Relative 04/26/2019 1  % Final  . Eosinophils Absolute 04/26/2019 0.1  0.0 - 0.5 K/uL Final  . Basophils Relative 04/26/2019 1  % Final  . Basophils Absolute 04/26/2019 0.1  0.0 - 0.1 K/uL Final  . Immature Granulocytes 04/26/2019 1  % Final  . Abs Immature Granulocytes 04/26/2019 0.07  0.00 - 0.07 K/uL Final   Performed at [facility], [place] [address], [place], [place] [address]  . Sodium 04/26/2019 140  135 - 145 mmol/L Final  . Potassium 04/26/2019 4.4  3.5 - 5.1 mmol/L Final  . Chloride 04/26/2019 104  98 - 111 mmol/L Final  . CO2 04/26/2019 27  22 - 32 mmol/L Final  . Glucose, Bld 04/26/2019 97  70 - 99 mg/dL Final  . BUN 04/26/2019 19  8 - 23 mg/dL Final  . Creatinine, Ser 04/26/2019 0.73  0.44 - 1.00 mg/dL Final  . Calcium 04/26/2019 9.3  8.9 - 10.3 mg/dL Final  . Total Protein 04/26/2019 6.8  6.5 - 8.1 g/dL Final  . Albumin 04/26/2019 4.0  3.5 - 5.0 g/dL Final  . AST 04/26/2019 23  15 - 41 U/L Final  . ALT 04/26/2019 33  0 - 44 U/L Final  . Alkaline Phosphatase 04/26/2019 80  38 - 126 U/L Final  . Total Bilirubin 04/26/2019 0.7  0.3 - 1.2 mg/dL Final  . GFR calc non Af Amer 04/26/2019 >60  >60 mL/min Final  . GFR calc Af Amer 04/26/2019 >60  >60 mL/min Final  . Anion gap 04/26/2019 9  5 - 15 Final   Performed at [facility], [place] [address] [place], [place] [address]  . Prothrombin Time 04/26/2019 12.9  11.4 - 15.2 seconds Final  . INR 04/26/2019 1.0  0.8 - 1.2 Final   Comment: (NOTE) INR goal varies based on device and disease states. Performed at [facility], [place] [address], [place], [place] [address]   . ABO/RH(D) 04/26/2019 O POS   Final  . Antibody Screen 04/26/2019 NEG   Final  . Sample Expiration 04/26/2019 05/01/2019,2359   Final  . Extend sample reason 04/26/2019    Final                   Value:NO TRANSFUSIONS OR PREGNANCY IN THE PAST 3 MONTHS Performed at [place] [address] [place], [place] [address]   . ABO/RH(D) 04/26/2019    Final                   Value:O POS Performed at  [facility], [place] [address] [place], [place] [address]   Hospital Outpatient Visit on 04/24/2019  Component Date Value Ref Range Status  . SARS-CoV-2, NAA 04/24/2019 NOT DETECTED  NOT DETECTED Final   Comment: (NOTE) This nucleic acid amplification test was developed and its performance characteristics determined by [employer]. Nucleic acid amplification tests include PCR and TMA. This test has not been FDA cleared or approved. This test has been authorized by FDA under an Emergency Use Authorization (EUA). This test is only authorized for the duration of time the declaration that circumstances exist justifying the authorization of the emergency use of in vitro diagnostic tests for detection of SARS-CoV-2 virus and/or diagnosis of COVID-19 infection under section 564(b)(1) of the Act, 21 U.S.C. [id number]) (1), unless the authorization is terminated or revoked sooner. When diagnostic testing is negative, the possibility of a false negative result should be considered in the context of a patient's recent exposures and the presence of clinical signs and symptoms consistent with COVID-19. An individual without symptoms of COVID- 19 and who is not shedding SARS-CoV-2 vi                          rus would expect to have a negative (not detected) result in this assay. Performed At: [facility] [address] [place], [place] [id number] Katina Degree MDPhD [id number]   . Coronavirus Source 04/24/2019 NASOPHARYNGEAL   Final   Performed at [facility], [place] [address], [place], [place] [address]     X-Rays:Dg Pelvis Portable  Result Date: 04/28/2019 CLINICAL DATA:  Left total hip replacement. EXAM: PORTABLE PELVIS 1-2 VIEWS COMPARISON:  Intraoperative views same date. FINDINGS: AP view of the lower pelvis demonstrates interval left total hip  arthroplasty with a screw fixed acetabular component. The hardware is well positioned. There is no evidence of acute fracture or dislocation. Mild right hip degenerative changes are present. There is a left hip surgical drain and a small amount of gas in the soft tissue surrounding the left hip. IMPRESSION: No demonstrated complication following left total hip arthroplasty. The hardware appears well positioned. Mild right hip degenerative changes. Electronically Signed   By: Richardean Sale M.D.   On: 04/28/2019 14:01   Dg C-arm 1-60 Min-no Report  Result Date: 04/28/2019 CLINICAL DATA:  Left hip replacement. EXAM: OPERATIVE left HIP (WITH PELVIS IF PERFORMED) 3 VIEWS TECHNIQUE: Fluoroscopic spot image(s) were submitted for interpretation post-operatively. FLUOROSCOPY TIME:  16 seconds. COMPARISON:  July 09, 2018. FINDINGS: Three intraoperative fluoroscopic images were obtained of the left hip. The left femoral and acetabular components appear to be well situated. IMPRESSION: Status post left total hip arthroplasty. See operative report for further details. Electronically Signed   By: Marijo Conception M.D.   On: 04/28/2019 13:11   Dg Hip Operative Unilat W Or W/o Pelvis Left  Result Date: 04/28/2019 CLINICAL DATA:  Left hip replacement. EXAM: OPERATIVE left HIP (WITH PELVIS IF PERFORMED) 3 VIEWS TECHNIQUE: Fluoroscopic spot image(s) were submitted for interpretation post-operatively. FLUOROSCOPY TIME:  16 seconds. COMPARISON:  July 09, 2018. FINDINGS: Three intraoperative fluoroscopic images were obtained of the left hip. The left femoral and acetabular components appear to be well situated. IMPRESSION: Status post left total hip arthroplasty. See operative report for further details. Electronically Signed   By: Marijo Conception M.D.   On: 04/28/2019 13:11    EKG:No orders found for this or any previous visit.  Hospital Course: Melissa Love is a 83 y.o. who was admitted to [facility]. They were brought to the operating room on 04/28/2019 and underwent Procedure(s): TOTAL HIP ARTHROPLASTY ANTERIOR APPROACH.  Patient tolerated the procedure well and was later transferred to the recovery room and then to the orthopaedic floor for postoperative care. They were given PO and IV analgesics for pain control following their surgery. They were given 24 hours of postoperative antibiotics of  Anti-infectives (From admission, onward)   Start     Dose/Rate Route Frequency Ordered Stop   04/28/19 2200  cephALEXin (KEFLEX) capsule 500 mg  Status:  Discontinued     500 mg Oral 3 times daily 04/28/19 1439 04/29/19 1728   04/28/19 1730  ceFAZolin (ANCEF) IVPB 2g/100 mL premix     2 g 200 mL/hr over 30 Minutes Intravenous Every 6 hours 04/28/19 1439 04/29/19 0009   04/28/19 1015  ceFAZolin (ANCEF) IVPB 2g/100 mL premix     2 g 200 mL/hr over 30 Minutes Intravenous On call to O.R. 04/28/19 1001 04/28/19 1216     and started on DVT prophylaxis in the form of Aspirin.   PT and OT were ordered for total joint protocol. Discharge planning consulted to help with postop disposition and equipment needs.  Patient had a good night on the evening of surgery. Experienced some nausea but this improved overnight. They started to get up OOB with therapy on POD #0. Pt was seen during rounds and was ready to go home pending progress with therapy. Hemovac drain was pulled without difficulty. She worked with therapy on POD #1 and was meeting her goals. Pt was discharged to home later that day in stable condition.  Diet: Regular diet Activity: WBAT Follow-up: in 2 weeks Disposition: Home with HEP Discharged Condition: stable   Discharge Instructions    Call MD / Call 911   Complete by: As directed    If you experience chest pain or shortness of breath, CALL 911 and be transported to the hospital emergency room.  If you develope a fever above 101 F, pus (white drainage) or increased drainage or redness  at the wound, or calf pain, call your surgeon's office.   Change dressing   Complete by: As directed    You may change your dressing on Friday, then change the dressing daily with sterile 4 x 4 inch gauze dressing and paper tape.   Constipation Prevention   Complete by: As directed    Drink plenty of fluids.  Prune juice may be helpful.  You may use a stool softener, such as Colace (over the counter) 100 mg twice a day.  Use MiraLax (over the counter) for constipation as needed.   Diet - low sodium heart healthy   Complete by: As directed    Discharge instructions   Complete by: As directed    Dr. Gaynelle Arabian Total Joint Specialist [facility] [address] [place], [place] [address] [phone]  ANTERIOR APPROACH TOTAL HIP REPLACEMENT POSTOPERATIVE DIRECTIONS   Hip Rehabilitation, Guidelines Following Surgery  The results of a hip operation are greatly improved after range of motion and muscle strengthening exercises. Follow all safety measures which are given to protect your hip. If any of these exercises cause increased pain or swelling in your joint, decrease the amount until you are comfortable again. Then slowly increase the exercises. Call your caregiver if you have problems or questions.   HOME CARE INSTRUCTIONS  Remove items at  home which could result in a fall. This includes throw rugs or furniture in walking pathways.  ICE to the affected hip every three hours for 30 minutes at a time and then as needed for pain and swelling.  Continue to use ice on the hip for pain and swelling from surgery. You may notice swelling that will progress down to the foot and ankle.  This is normal after surgery.  Elevate the leg when you are not up walking on it.   Continue to use the breathing machine which will help keep your temperature down.  It is common for your temperature to cycle up and down following surgery, especially at night when you are not up moving around and  exerting yourself.  The breathing machine keeps your lungs expanded and your temperature down.  DIET You may resume your previous home diet once your are discharged from the hospital.  DRESSING / WOUND CARE / SHOWERING You may change your dressing 3-5 days after surgery.  Then change the dressing every day with sterile gauze.  Please use good hand washing techniques before changing the dressing.  Do not use any lotions or creams on the incision until instructed by your surgeon. You may start showering once you are discharged home but do not submerge the incision under water. Just pat the incision dry and apply a dry gauze dressing on daily. Change the surgical dressing daily and reapply a dry dressing each time.  ACTIVITY Walk with your walker as instructed. Use walker as long as suggested by your caregivers. Avoid periods of inactivity such as sitting longer than an hour when not asleep. This helps prevent blood clots.  You may resume a sexual relationship in one month or when given the OK by your doctor.  You may return to work once you are cleared by your doctor.  Do not drive a car for 6 weeks or until released by you surgeon.  Do not drive while taking narcotics.  WEIGHT BEARING Weight bearing as tolerated with assist device (walker, cane, etc) as directed, use it as long as suggested by your surgeon or therapist, typically at least 4-6 weeks.  POSTOPERATIVE CONSTIPATION PROTOCOL Constipation - defined medically as fewer than three stools per week and severe constipation as less than one stool per week.  One of the most common issues patients have following surgery is constipation.  Even if you have a regular bowel pattern at home, your normal regimen is likely to be disrupted due to multiple reasons following surgery.  Combination of anesthesia, postoperative narcotics, change in appetite and fluid intake all can affect your bowels.  In order to avoid complications following surgery,  here are some recommendations in order to help you during your recovery period.  Colace (docusate) - Pick up an over-the-counter form of Colace or another stool softener and take twice a day as long as you are requiring postoperative pain medications.  Take with a full glass of water daily.  If you experience loose stools or diarrhea, hold the colace until you stool forms back up.  If your symptoms do not get better within 1 week or if they get worse, check with your doctor.  Dulcolax (bisacodyl) - Pick up over-the-counter and take as directed by the product packaging as needed to assist with the movement of your bowels.  Take with a full glass of water.  Use this product as needed if not relieved by Colace only.   MiraLax (polyethylene glycol) - Pick up  over-the-counter to have on hand.  MiraLax is a solution that will increase the amount of water in your bowels to assist with bowel movements.  Take as directed and can mix with a glass of water, juice, soda, coffee, or tea.  Take if you go more than two days without a movement. Do not use MiraLax more than once per day. Call your doctor if you are still constipated or irregular after using this medication for 7 days in a row.  If you continue to have problems with postoperative constipation, please contact the office for further assistance and recommendations.  If you experience "the worst abdominal pain ever" or develop nausea or vomiting, please contact the office immediatly for further recommendations for treatment.  ITCHING  If you experience itching with your medications, try taking only a single pain pill, or even half a pain pill at a time.  You can also use Benadryl over the counter for itching or also to help with sleep.   TED HOSE STOCKINGS Wear the elastic stockings on both legs for three weeks following surgery during the day but you may remove then at night for sleeping.  MEDICATIONS See your medication summary on the "After Visit  Summary" that the nursing staff will review with you prior to discharge.  You may have some home medications which will be placed on hold until you complete the course of blood thinner medication.  It is important for you to complete the blood thinner medication as prescribed by your surgeon.  Continue your approved medications as instructed at time of discharge.  PRECAUTIONS If you experience chest pain or shortness of breath - call 911 immediately for transfer to the hospital emergency department.  If you develop a fever greater that 101 F, purulent drainage from wound, increased redness or drainage from wound, foul odor from the wound/dressing, or calf pain - CONTACT YOUR SURGEON.                                                   FOLLOW-UP APPOINTMENTS Make sure you keep all of your appointments after your operation with your surgeon and caregivers. You should call the office at the above phone number and make an appointment for approximately two weeks after the date of your surgery or on the date instructed by your surgeon outlined in the "After Visit Summary".  RANGE OF MOTION AND STRENGTHENING EXERCISES  These exercises are designed to help you keep full movement of your hip joint. Follow your caregiver's or physical therapist's instructions. Perform all exercises about fifteen times, three times per day or as directed. Exercise both hips, even if you have had only one joint replacement. These exercises can be done on a training (exercise) mat, on the floor, on a table or on a bed. Use whatever works the best and is most comfortable for you. Use music or television while you are exercising so that the exercises are a pleasant break in your day. This will make your life better with the exercises acting as a break in routine you can look forward to.  Lying on your back, slowly slide your foot toward your buttocks, raising your knee up off the floor. Then slowly slide your foot back down until your leg  is straight again.  Lying on your back spread your legs as far apart as  you can without causing discomfort.  Lying on your side, raise your upper leg and foot straight up from the floor as far as is comfortable. Slowly lower the leg and repeat.  Lying on your back, tighten up the muscle in the front of your thigh (quadriceps muscles). You can do this by keeping your leg straight and trying to raise your heel off the floor. This helps strengthen the largest muscle supporting your knee.  Lying on your back, tighten up the muscles of your buttocks both with the legs straight and with the knee bent at a comfortable angle while keeping your heel on the floor.   IF YOU ARE TRANSFERRED TO A SKILLED REHAB FACILITY If the patient is transferred to a skilled rehab facility following release from the hospital, a list of the current medications will be sent to the facility for the patient to continue.  When discharged from the skilled rehab facility, please have the facility set up the patient's [place] prior to being released. Also, the skilled facility will be responsible for providing the patient with their medications at time of release from the facility to include their pain medication, the muscle relaxants, and their blood thinner medication. If the patient is still at the rehab facility at time of the two week follow up appointment, the skilled rehab facility will also need to assist the patient in arranging follow up appointment in our office and any transportation needs.  MAKE SURE YOU:  Understand these instructions.  Get help right away if you are not doing well or get worse.    Pick up stool softner and laxative for home use following surgery while on pain medications. Do not submerge incision under water. Please use good hand washing techniques while changing dressing each day. May shower starting three days after surgery. Please use a clean towel to pat the incision dry  following showers. Continue to use ice for pain and swelling after surgery. Do not use any lotions or creams on the incision until instructed by your surgeon.   Do not sit on low chairs, stoools or toilet seats, as it may be difficult to get up from low surfaces   Complete by: As directed    Driving restrictions   Complete by: As directed    No driving for two weeks   TED hose   Complete by: As directed    Use stockings (TED hose) for three weeks on both leg(s).  You may remove them at night for sleeping.   Weight bearing as tolerated   Complete by: As directed      Allergies as of 04/29/2019      Reactions   Penicillins Rash   Did it involve swelling of the face/tongue/throat, SOB, or low BP? No Did it involve sudden or severe rash/hives, skin peeling, or any reaction on the inside of your mouth or nose? Yes Did you need to seek medical attention at a hospital or doctor's office? Yes When did it last happen?4 years If all above answers are "NO", may proceed with cephalosporin use.   Sulfa Antibiotics Rash      Medication List    STOP taking these medications   celecoxib 200 MG capsule Commonly known as: CELEBREX     TAKE these medications   acetaminophen 500 MG tablet Commonly known as: TYLENOL Take 500 mg by mouth every 6 (six) hours as needed for moderate pain or headache.   amLODipine 5 MG tablet Commonly known  as: NORVASC Take 5 mg by mouth daily.   aspirin 325 MG EC tablet Take 1 tablet (325 mg total) by mouth 2 (two) times daily for 20 days. Then take one 81 mg aspirin once a day for three weeks. Then discontinue aspirin.   atenolol 50 MG tablet Commonly known as: TENORMIN Take 50 mg by mouth daily.   calcium carbonate 500 MG chewable tablet Commonly known as: TUMS - dosed in mg elemental calcium Chew 1 tablet by mouth daily as needed for indigestion or heartburn.   cephALEXin 500 MG capsule Commonly known as: KEFLEX Take 500 mg by mouth 3 (three)  times daily. Started Friday  04-23-2019 for 7 days for Urinary Track Infection (until Thursday 04/29/2019 and then take a maintenance dose of Keflex daily indefinitely   cetirizine 10 MG tablet Commonly known as: ZYRTEC Take 10 mg by mouth daily.   CoQ10 100 MG Caps Take 100 mg by mouth daily.   D-MANNOSE PO Take 1,000 mg by mouth 2 (two) times daily.   fluticasone 50 MCG/ACT nasal spray Commonly known as: FLONASE Place 2 sprays into both nostrils 2 (two) times daily.   HYDROcodone-acetaminophen 5-325 MG tablet Commonly known as: NORCO/VICODIN Take 1-2 tablets by mouth every 6 (six) hours as needed for severe pain.   levothyroxine 25 MCG tablet Commonly known as: SYNTHROID Take 25 mcg by mouth daily before breakfast.   LORazepam 0.5 MG tablet Commonly known as: ATIVAN Take 0.5 mg by mouth at bedtime.   Magnesium 200 MG Tabs Take 200 mg by mouth daily.   methocarbamol 500 MG tablet Commonly known as: ROBAXIN Take 1 tablet (500 mg total) by mouth every 6 (six) hours as needed for muscle spasms.   ondansetron 4 MG tablet Commonly known as: ZOFRAN Take 1 tablet (4 mg total) by mouth every 6 (six) hours as needed for nausea.   Potassium 99 MG Tabs Take 99 mg by mouth daily.   PreserVision AREDS 2 Caps Take 1 capsule by mouth 2 (two) times daily.   CENTRUM SILVER PO Take 1 tablet by mouth daily.   sertraline 50 MG tablet Commonly known as: ZOLOFT Take 50 mg by mouth at bedtime.   simvastatin 20 MG tablet Commonly known as: ZOCOR Take 20 mg by mouth daily.   SYSTANE OP Place 1 drop into both eyes daily as needed (dry eyes).   Vitamin D 50 MCG (2000 UT) tablet Take 2,000 Units by mouth daily.            Discharge Care Instructions  (From admission, onward)         Start     Ordered   04/28/19 0000  Weight bearing as tolerated     04/28/19 1502   04/28/19 0000  Change dressing    Comments: You may change your dressing on Friday, then change the  dressing daily with sterile 4 x 4 inch gauze dressing and paper tape.   04/28/19 1502         Follow-up Information    Gaynelle Arabian, MD. Schedule an appointment as soon as possible for a visit on 05/13/2019.   Specialty: Orthopedic Surgery Contact information: [address] [place] [address] [facility].   Specialty: [facility]: [facility] physical therapy Contact information: [address] [place] [address] [phone]           Signed: Theresa Duty, PA-C Orthopedic  Surgery 05/03/2019, 9:12 AM

## 2019-06-10 DIAGNOSIS — Z96642 Presence of left artificial hip joint: Secondary | ICD-10-CM | POA: Insufficient documentation

## 2019-06-14 DIAGNOSIS — M25411 Effusion, right shoulder: Secondary | ICD-10-CM | POA: Diagnosis not present

## 2019-06-14 DIAGNOSIS — M25611 Stiffness of right shoulder, not elsewhere classified: Secondary | ICD-10-CM | POA: Diagnosis not present

## 2019-06-14 DIAGNOSIS — M25511 Pain in right shoulder: Secondary | ICD-10-CM | POA: Diagnosis not present

## 2019-06-21 DIAGNOSIS — Z96642 Presence of left artificial hip joint: Secondary | ICD-10-CM | POA: Diagnosis not present

## 2019-06-21 DIAGNOSIS — Z471 Aftercare following joint replacement surgery: Secondary | ICD-10-CM | POA: Diagnosis not present

## 2019-06-21 DIAGNOSIS — M25562 Pain in left knee: Secondary | ICD-10-CM | POA: Diagnosis not present

## 2019-07-02 DIAGNOSIS — M25562 Pain in left knee: Secondary | ICD-10-CM | POA: Diagnosis not present

## 2019-07-02 DIAGNOSIS — Z96642 Presence of left artificial hip joint: Secondary | ICD-10-CM | POA: Diagnosis not present

## 2019-07-02 DIAGNOSIS — M7062 Trochanteric bursitis, left hip: Secondary | ICD-10-CM | POA: Diagnosis not present

## 2019-07-06 DIAGNOSIS — M25552 Pain in left hip: Secondary | ICD-10-CM | POA: Diagnosis not present

## 2019-07-28 DIAGNOSIS — H353221 Exudative age-related macular degeneration, left eye, with active choroidal neovascularization: Secondary | ICD-10-CM | POA: Diagnosis not present

## 2019-07-29 DIAGNOSIS — Z79899 Other long term (current) drug therapy: Secondary | ICD-10-CM | POA: Diagnosis not present

## 2019-07-29 DIAGNOSIS — Z6829 Body mass index (BMI) 29.0-29.9, adult: Secondary | ICD-10-CM | POA: Diagnosis not present

## 2019-07-29 DIAGNOSIS — M25562 Pain in left knee: Secondary | ICD-10-CM | POA: Diagnosis not present

## 2019-08-09 DIAGNOSIS — M25562 Pain in left knee: Secondary | ICD-10-CM | POA: Diagnosis not present

## 2019-08-09 DIAGNOSIS — M1712 Unilateral primary osteoarthritis, left knee: Secondary | ICD-10-CM | POA: Diagnosis not present

## 2019-08-16 DIAGNOSIS — R350 Frequency of micturition: Secondary | ICD-10-CM | POA: Diagnosis not present

## 2019-08-24 DIAGNOSIS — R300 Dysuria: Secondary | ICD-10-CM | POA: Diagnosis not present

## 2019-08-24 DIAGNOSIS — R350 Frequency of micturition: Secondary | ICD-10-CM | POA: Diagnosis not present

## 2019-08-24 DIAGNOSIS — N3941 Urge incontinence: Secondary | ICD-10-CM | POA: Diagnosis not present

## 2019-08-24 DIAGNOSIS — Z87440 Personal history of urinary (tract) infections: Secondary | ICD-10-CM | POA: Diagnosis not present

## 2019-09-10 DIAGNOSIS — M19011 Primary osteoarthritis, right shoulder: Secondary | ICD-10-CM | POA: Diagnosis not present

## 2019-09-10 DIAGNOSIS — M25511 Pain in right shoulder: Secondary | ICD-10-CM | POA: Diagnosis not present

## 2019-09-15 DIAGNOSIS — H353221 Exudative age-related macular degeneration, left eye, with active choroidal neovascularization: Secondary | ICD-10-CM | POA: Diagnosis not present

## 2019-10-13 DIAGNOSIS — M25512 Pain in left shoulder: Secondary | ICD-10-CM | POA: Diagnosis not present

## 2019-10-13 DIAGNOSIS — M19012 Primary osteoarthritis, left shoulder: Secondary | ICD-10-CM | POA: Diagnosis not present

## 2019-10-13 DIAGNOSIS — M19011 Primary osteoarthritis, right shoulder: Secondary | ICD-10-CM | POA: Diagnosis not present

## 2019-10-20 DIAGNOSIS — M25511 Pain in right shoulder: Secondary | ICD-10-CM | POA: Diagnosis not present

## 2019-10-20 DIAGNOSIS — M19011 Primary osteoarthritis, right shoulder: Secondary | ICD-10-CM | POA: Diagnosis not present

## 2019-10-20 DIAGNOSIS — G8929 Other chronic pain: Secondary | ICD-10-CM | POA: Diagnosis not present

## 2019-10-22 DIAGNOSIS — Z1331 Encounter for screening for depression: Secondary | ICD-10-CM | POA: Diagnosis not present

## 2019-10-22 DIAGNOSIS — Z9181 History of falling: Secondary | ICD-10-CM | POA: Diagnosis not present

## 2019-10-22 DIAGNOSIS — E785 Hyperlipidemia, unspecified: Secondary | ICD-10-CM | POA: Diagnosis not present

## 2019-10-22 DIAGNOSIS — Z0000 Encounter for general adult medical examination without abnormal findings: Secondary | ICD-10-CM | POA: Diagnosis not present

## 2019-11-04 DIAGNOSIS — M19011 Primary osteoarthritis, right shoulder: Secondary | ICD-10-CM | POA: Diagnosis not present

## 2019-11-05 DIAGNOSIS — H353222 Exudative age-related macular degeneration, left eye, with inactive choroidal neovascularization: Secondary | ICD-10-CM | POA: Diagnosis not present

## 2019-11-10 DIAGNOSIS — E559 Vitamin D deficiency, unspecified: Secondary | ICD-10-CM | POA: Diagnosis not present

## 2019-11-10 DIAGNOSIS — M79609 Pain in unspecified limb: Secondary | ICD-10-CM | POA: Diagnosis not present

## 2019-11-10 DIAGNOSIS — Z01818 Encounter for other preprocedural examination: Secondary | ICD-10-CM | POA: Diagnosis not present

## 2019-11-10 DIAGNOSIS — R52 Pain, unspecified: Secondary | ICD-10-CM | POA: Diagnosis not present

## 2019-11-10 DIAGNOSIS — Z0389 Encounter for observation for other suspected diseases and conditions ruled out: Secondary | ICD-10-CM | POA: Diagnosis not present

## 2019-11-10 DIAGNOSIS — Z79899 Other long term (current) drug therapy: Secondary | ICD-10-CM | POA: Diagnosis not present

## 2019-11-11 DIAGNOSIS — I10 Essential (primary) hypertension: Secondary | ICD-10-CM | POA: Diagnosis not present

## 2019-11-11 DIAGNOSIS — M19011 Primary osteoarthritis, right shoulder: Secondary | ICD-10-CM | POA: Diagnosis not present

## 2019-11-11 DIAGNOSIS — E063 Autoimmune thyroiditis: Secondary | ICD-10-CM | POA: Diagnosis not present

## 2019-11-11 DIAGNOSIS — Z01818 Encounter for other preprocedural examination: Secondary | ICD-10-CM | POA: Diagnosis not present

## 2019-12-16 DIAGNOSIS — Z1159 Encounter for screening for other viral diseases: Secondary | ICD-10-CM | POA: Diagnosis not present

## 2019-12-21 DIAGNOSIS — G8929 Other chronic pain: Secondary | ICD-10-CM | POA: Diagnosis not present

## 2019-12-21 DIAGNOSIS — E669 Obesity, unspecified: Secondary | ICD-10-CM | POA: Diagnosis not present

## 2019-12-21 DIAGNOSIS — I10 Essential (primary) hypertension: Secondary | ICD-10-CM | POA: Diagnosis not present

## 2019-12-21 DIAGNOSIS — Z87891 Personal history of nicotine dependence: Secondary | ICD-10-CM | POA: Diagnosis not present

## 2019-12-21 DIAGNOSIS — J302 Other seasonal allergic rhinitis: Secondary | ICD-10-CM | POA: Diagnosis not present

## 2019-12-21 DIAGNOSIS — H3530 Unspecified macular degeneration: Secondary | ICD-10-CM | POA: Diagnosis not present

## 2019-12-21 DIAGNOSIS — E039 Hypothyroidism, unspecified: Secondary | ICD-10-CM | POA: Diagnosis not present

## 2019-12-21 DIAGNOSIS — Z471 Aftercare following joint replacement surgery: Secondary | ICD-10-CM | POA: Diagnosis not present

## 2019-12-21 DIAGNOSIS — M19011 Primary osteoarthritis, right shoulder: Secondary | ICD-10-CM | POA: Diagnosis not present

## 2019-12-21 DIAGNOSIS — Z79899 Other long term (current) drug therapy: Secondary | ICD-10-CM | POA: Diagnosis not present

## 2019-12-21 DIAGNOSIS — E785 Hyperlipidemia, unspecified: Secondary | ICD-10-CM | POA: Diagnosis not present

## 2019-12-21 DIAGNOSIS — Z96611 Presence of right artificial shoulder joint: Secondary | ICD-10-CM | POA: Diagnosis not present

## 2019-12-21 DIAGNOSIS — G8918 Other acute postprocedural pain: Secondary | ICD-10-CM | POA: Diagnosis not present

## 2019-12-22 DIAGNOSIS — E039 Hypothyroidism, unspecified: Secondary | ICD-10-CM | POA: Diagnosis not present

## 2019-12-22 DIAGNOSIS — E785 Hyperlipidemia, unspecified: Secondary | ICD-10-CM | POA: Diagnosis not present

## 2019-12-22 DIAGNOSIS — M19011 Primary osteoarthritis, right shoulder: Secondary | ICD-10-CM | POA: Diagnosis not present

## 2019-12-22 DIAGNOSIS — G8929 Other chronic pain: Secondary | ICD-10-CM | POA: Diagnosis not present

## 2019-12-22 DIAGNOSIS — E669 Obesity, unspecified: Secondary | ICD-10-CM | POA: Diagnosis not present

## 2019-12-22 DIAGNOSIS — I10 Essential (primary) hypertension: Secondary | ICD-10-CM | POA: Diagnosis not present

## 2019-12-28 DIAGNOSIS — M25412 Effusion, left shoulder: Secondary | ICD-10-CM | POA: Diagnosis not present

## 2019-12-28 DIAGNOSIS — M25611 Stiffness of right shoulder, not elsewhere classified: Secondary | ICD-10-CM | POA: Diagnosis not present

## 2019-12-28 DIAGNOSIS — M25511 Pain in right shoulder: Secondary | ICD-10-CM | POA: Diagnosis not present

## 2019-12-29 DIAGNOSIS — M25511 Pain in right shoulder: Secondary | ICD-10-CM | POA: Diagnosis not present

## 2019-12-29 DIAGNOSIS — M25412 Effusion, left shoulder: Secondary | ICD-10-CM | POA: Diagnosis not present

## 2019-12-29 DIAGNOSIS — M25611 Stiffness of right shoulder, not elsewhere classified: Secondary | ICD-10-CM | POA: Diagnosis not present

## 2020-01-04 DIAGNOSIS — M25511 Pain in right shoulder: Secondary | ICD-10-CM | POA: Diagnosis not present

## 2020-01-04 DIAGNOSIS — M25412 Effusion, left shoulder: Secondary | ICD-10-CM | POA: Diagnosis not present

## 2020-01-04 DIAGNOSIS — M25611 Stiffness of right shoulder, not elsewhere classified: Secondary | ICD-10-CM | POA: Diagnosis not present

## 2020-01-06 DIAGNOSIS — M25511 Pain in right shoulder: Secondary | ICD-10-CM | POA: Diagnosis not present

## 2020-01-06 DIAGNOSIS — M25412 Effusion, left shoulder: Secondary | ICD-10-CM | POA: Diagnosis not present

## 2020-01-06 DIAGNOSIS — M25611 Stiffness of right shoulder, not elsewhere classified: Secondary | ICD-10-CM | POA: Diagnosis not present

## 2020-01-11 DIAGNOSIS — M25412 Effusion, left shoulder: Secondary | ICD-10-CM | POA: Diagnosis not present

## 2020-01-11 DIAGNOSIS — M25611 Stiffness of right shoulder, not elsewhere classified: Secondary | ICD-10-CM | POA: Diagnosis not present

## 2020-01-11 DIAGNOSIS — M25511 Pain in right shoulder: Secondary | ICD-10-CM | POA: Diagnosis not present

## 2020-01-13 DIAGNOSIS — M25611 Stiffness of right shoulder, not elsewhere classified: Secondary | ICD-10-CM | POA: Diagnosis not present

## 2020-01-13 DIAGNOSIS — M25511 Pain in right shoulder: Secondary | ICD-10-CM | POA: Diagnosis not present

## 2020-01-13 DIAGNOSIS — M25412 Effusion, left shoulder: Secondary | ICD-10-CM | POA: Diagnosis not present

## 2020-01-18 DIAGNOSIS — M25611 Stiffness of right shoulder, not elsewhere classified: Secondary | ICD-10-CM | POA: Diagnosis not present

## 2020-01-18 DIAGNOSIS — M25511 Pain in right shoulder: Secondary | ICD-10-CM | POA: Diagnosis not present

## 2020-01-18 DIAGNOSIS — M25412 Effusion, left shoulder: Secondary | ICD-10-CM | POA: Diagnosis not present

## 2020-01-20 DIAGNOSIS — M25611 Stiffness of right shoulder, not elsewhere classified: Secondary | ICD-10-CM | POA: Diagnosis not present

## 2020-01-20 DIAGNOSIS — M25412 Effusion, left shoulder: Secondary | ICD-10-CM | POA: Diagnosis not present

## 2020-01-20 DIAGNOSIS — M25511 Pain in right shoulder: Secondary | ICD-10-CM | POA: Diagnosis not present

## 2020-01-21 DIAGNOSIS — H353221 Exudative age-related macular degeneration, left eye, with active choroidal neovascularization: Secondary | ICD-10-CM | POA: Diagnosis not present

## 2020-01-25 DIAGNOSIS — M159 Polyosteoarthritis, unspecified: Secondary | ICD-10-CM | POA: Diagnosis not present

## 2020-01-25 DIAGNOSIS — E785 Hyperlipidemia, unspecified: Secondary | ICD-10-CM | POA: Diagnosis not present

## 2020-01-25 DIAGNOSIS — E538 Deficiency of other specified B group vitamins: Secondary | ICD-10-CM | POA: Diagnosis not present

## 2020-01-25 DIAGNOSIS — I10 Essential (primary) hypertension: Secondary | ICD-10-CM | POA: Diagnosis not present

## 2020-01-25 DIAGNOSIS — Z6830 Body mass index (BMI) 30.0-30.9, adult: Secondary | ICD-10-CM | POA: Diagnosis not present

## 2020-01-25 DIAGNOSIS — E063 Autoimmune thyroiditis: Secondary | ICD-10-CM | POA: Diagnosis not present

## 2020-01-25 DIAGNOSIS — E669 Obesity, unspecified: Secondary | ICD-10-CM | POA: Diagnosis not present

## 2020-01-25 DIAGNOSIS — E559 Vitamin D deficiency, unspecified: Secondary | ICD-10-CM | POA: Diagnosis not present

## 2020-01-25 DIAGNOSIS — F411 Generalized anxiety disorder: Secondary | ICD-10-CM | POA: Diagnosis not present

## 2020-01-25 DIAGNOSIS — Z79899 Other long term (current) drug therapy: Secondary | ICD-10-CM | POA: Diagnosis not present

## 2020-01-26 DIAGNOSIS — M25412 Effusion, left shoulder: Secondary | ICD-10-CM | POA: Diagnosis not present

## 2020-01-26 DIAGNOSIS — M25611 Stiffness of right shoulder, not elsewhere classified: Secondary | ICD-10-CM | POA: Diagnosis not present

## 2020-01-26 DIAGNOSIS — M25511 Pain in right shoulder: Secondary | ICD-10-CM | POA: Diagnosis not present

## 2020-01-27 DIAGNOSIS — M19011 Primary osteoarthritis, right shoulder: Secondary | ICD-10-CM | POA: Diagnosis not present

## 2020-02-01 DIAGNOSIS — M25511 Pain in right shoulder: Secondary | ICD-10-CM | POA: Diagnosis not present

## 2020-02-01 DIAGNOSIS — M25412 Effusion, left shoulder: Secondary | ICD-10-CM | POA: Diagnosis not present

## 2020-02-01 DIAGNOSIS — M25611 Stiffness of right shoulder, not elsewhere classified: Secondary | ICD-10-CM | POA: Diagnosis not present

## 2020-02-03 DIAGNOSIS — M25611 Stiffness of right shoulder, not elsewhere classified: Secondary | ICD-10-CM | POA: Diagnosis not present

## 2020-02-03 DIAGNOSIS — M25412 Effusion, left shoulder: Secondary | ICD-10-CM | POA: Diagnosis not present

## 2020-02-03 DIAGNOSIS — M25511 Pain in right shoulder: Secondary | ICD-10-CM | POA: Diagnosis not present

## 2020-02-08 DIAGNOSIS — M25412 Effusion, left shoulder: Secondary | ICD-10-CM | POA: Diagnosis not present

## 2020-02-08 DIAGNOSIS — M25511 Pain in right shoulder: Secondary | ICD-10-CM | POA: Diagnosis not present

## 2020-02-08 DIAGNOSIS — M25611 Stiffness of right shoulder, not elsewhere classified: Secondary | ICD-10-CM | POA: Diagnosis not present

## 2020-02-10 DIAGNOSIS — M25611 Stiffness of right shoulder, not elsewhere classified: Secondary | ICD-10-CM | POA: Diagnosis not present

## 2020-02-10 DIAGNOSIS — M25511 Pain in right shoulder: Secondary | ICD-10-CM | POA: Diagnosis not present

## 2020-02-10 DIAGNOSIS — M25412 Effusion, left shoulder: Secondary | ICD-10-CM | POA: Diagnosis not present

## 2020-02-15 DIAGNOSIS — M25611 Stiffness of right shoulder, not elsewhere classified: Secondary | ICD-10-CM | POA: Diagnosis not present

## 2020-02-15 DIAGNOSIS — M25511 Pain in right shoulder: Secondary | ICD-10-CM | POA: Diagnosis not present

## 2020-02-15 DIAGNOSIS — M25412 Effusion, left shoulder: Secondary | ICD-10-CM | POA: Diagnosis not present

## 2020-02-17 DIAGNOSIS — M1610 Unilateral primary osteoarthritis, unspecified hip: Secondary | ICD-10-CM | POA: Diagnosis not present

## 2020-02-21 DIAGNOSIS — M1611 Unilateral primary osteoarthritis, right hip: Secondary | ICD-10-CM | POA: Diagnosis not present

## 2020-02-21 DIAGNOSIS — M1610 Unilateral primary osteoarthritis, unspecified hip: Secondary | ICD-10-CM | POA: Diagnosis not present

## 2020-02-24 DIAGNOSIS — M25412 Effusion, left shoulder: Secondary | ICD-10-CM | POA: Diagnosis not present

## 2020-02-24 DIAGNOSIS — M25511 Pain in right shoulder: Secondary | ICD-10-CM | POA: Diagnosis not present

## 2020-02-24 DIAGNOSIS — M25611 Stiffness of right shoulder, not elsewhere classified: Secondary | ICD-10-CM | POA: Diagnosis not present

## 2020-02-25 DIAGNOSIS — H353221 Exudative age-related macular degeneration, left eye, with active choroidal neovascularization: Secondary | ICD-10-CM | POA: Diagnosis not present

## 2020-03-01 DIAGNOSIS — M25412 Effusion, left shoulder: Secondary | ICD-10-CM | POA: Diagnosis not present

## 2020-03-01 DIAGNOSIS — M25511 Pain in right shoulder: Secondary | ICD-10-CM | POA: Diagnosis not present

## 2020-03-01 DIAGNOSIS — M25611 Stiffness of right shoulder, not elsewhere classified: Secondary | ICD-10-CM | POA: Diagnosis not present

## 2020-03-08 DIAGNOSIS — M25412 Effusion, left shoulder: Secondary | ICD-10-CM | POA: Diagnosis not present

## 2020-03-08 DIAGNOSIS — M25511 Pain in right shoulder: Secondary | ICD-10-CM | POA: Diagnosis not present

## 2020-03-08 DIAGNOSIS — M25611 Stiffness of right shoulder, not elsewhere classified: Secondary | ICD-10-CM | POA: Diagnosis not present

## 2020-03-14 DIAGNOSIS — M1610 Unilateral primary osteoarthritis, unspecified hip: Secondary | ICD-10-CM | POA: Diagnosis not present

## 2020-03-31 DIAGNOSIS — H353221 Exudative age-related macular degeneration, left eye, with active choroidal neovascularization: Secondary | ICD-10-CM | POA: Diagnosis not present

## 2020-04-03 DIAGNOSIS — M1611 Unilateral primary osteoarthritis, right hip: Secondary | ICD-10-CM | POA: Diagnosis not present

## 2020-04-03 DIAGNOSIS — Z79899 Other long term (current) drug therapy: Secondary | ICD-10-CM | POA: Diagnosis not present

## 2020-04-03 DIAGNOSIS — Z23 Encounter for immunization: Secondary | ICD-10-CM | POA: Diagnosis not present

## 2020-04-25 DIAGNOSIS — M159 Polyosteoarthritis, unspecified: Secondary | ICD-10-CM | POA: Diagnosis not present

## 2020-04-25 DIAGNOSIS — Z79899 Other long term (current) drug therapy: Secondary | ICD-10-CM | POA: Diagnosis not present

## 2020-04-25 DIAGNOSIS — F411 Generalized anxiety disorder: Secondary | ICD-10-CM | POA: Diagnosis not present

## 2020-04-25 DIAGNOSIS — E559 Vitamin D deficiency, unspecified: Secondary | ICD-10-CM | POA: Diagnosis not present

## 2020-04-25 DIAGNOSIS — E785 Hyperlipidemia, unspecified: Secondary | ICD-10-CM | POA: Diagnosis not present

## 2020-04-25 DIAGNOSIS — Z6829 Body mass index (BMI) 29.0-29.9, adult: Secondary | ICD-10-CM | POA: Diagnosis not present

## 2020-04-25 DIAGNOSIS — J302 Other seasonal allergic rhinitis: Secondary | ICD-10-CM | POA: Diagnosis not present

## 2020-04-25 DIAGNOSIS — I10 Essential (primary) hypertension: Secondary | ICD-10-CM | POA: Diagnosis not present

## 2020-04-25 DIAGNOSIS — E063 Autoimmune thyroiditis: Secondary | ICD-10-CM | POA: Diagnosis not present

## 2020-04-25 DIAGNOSIS — E538 Deficiency of other specified B group vitamins: Secondary | ICD-10-CM | POA: Diagnosis not present

## 2020-05-09 DIAGNOSIS — M1610 Unilateral primary osteoarthritis, unspecified hip: Secondary | ICD-10-CM | POA: Diagnosis not present

## 2020-05-09 DIAGNOSIS — Z23 Encounter for immunization: Secondary | ICD-10-CM | POA: Diagnosis not present

## 2020-05-11 DIAGNOSIS — Z6829 Body mass index (BMI) 29.0-29.9, adult: Secondary | ICD-10-CM | POA: Diagnosis not present

## 2020-05-11 DIAGNOSIS — B373 Candidiasis of vulva and vagina: Secondary | ICD-10-CM | POA: Diagnosis not present

## 2020-05-11 DIAGNOSIS — R350 Frequency of micturition: Secondary | ICD-10-CM | POA: Diagnosis not present

## 2020-05-12 DIAGNOSIS — H353222 Exudative age-related macular degeneration, left eye, with inactive choroidal neovascularization: Secondary | ICD-10-CM | POA: Diagnosis not present

## 2020-05-24 DIAGNOSIS — N390 Urinary tract infection, site not specified: Secondary | ICD-10-CM | POA: Diagnosis not present

## 2020-05-24 DIAGNOSIS — Z79899 Other long term (current) drug therapy: Secondary | ICD-10-CM | POA: Diagnosis not present

## 2020-05-24 DIAGNOSIS — Z6829 Body mass index (BMI) 29.0-29.9, adult: Secondary | ICD-10-CM | POA: Diagnosis not present

## 2020-06-07 DIAGNOSIS — N39498 Other specified urinary incontinence: Secondary | ICD-10-CM | POA: Diagnosis not present

## 2020-06-07 DIAGNOSIS — Z87440 Personal history of urinary (tract) infections: Secondary | ICD-10-CM | POA: Diagnosis not present

## 2020-06-21 DIAGNOSIS — E039 Hypothyroidism, unspecified: Secondary | ICD-10-CM | POA: Diagnosis not present

## 2020-06-21 DIAGNOSIS — J302 Other seasonal allergic rhinitis: Secondary | ICD-10-CM | POA: Diagnosis not present

## 2020-06-21 DIAGNOSIS — M79609 Pain in unspecified limb: Secondary | ICD-10-CM | POA: Diagnosis not present

## 2020-06-21 DIAGNOSIS — E669 Obesity, unspecified: Secondary | ICD-10-CM | POA: Diagnosis not present

## 2020-06-21 DIAGNOSIS — Z87891 Personal history of nicotine dependence: Secondary | ICD-10-CM | POA: Diagnosis not present

## 2020-06-21 DIAGNOSIS — Z79899 Other long term (current) drug therapy: Secondary | ICD-10-CM | POA: Diagnosis not present

## 2020-06-21 DIAGNOSIS — E785 Hyperlipidemia, unspecified: Secondary | ICD-10-CM | POA: Diagnosis not present

## 2020-06-21 DIAGNOSIS — R52 Pain, unspecified: Secondary | ICD-10-CM | POA: Diagnosis not present

## 2020-06-21 DIAGNOSIS — I10 Essential (primary) hypertension: Secondary | ICD-10-CM | POA: Diagnosis not present

## 2020-06-21 DIAGNOSIS — E559 Vitamin D deficiency, unspecified: Secondary | ICD-10-CM | POA: Diagnosis not present

## 2020-06-21 DIAGNOSIS — Z01818 Encounter for other preprocedural examination: Secondary | ICD-10-CM | POA: Diagnosis not present

## 2020-07-13 DIAGNOSIS — Z1152 Encounter for screening for COVID-19: Secondary | ICD-10-CM | POA: Diagnosis not present

## 2020-07-13 DIAGNOSIS — M1611 Unilateral primary osteoarthritis, right hip: Secondary | ICD-10-CM | POA: Diagnosis not present

## 2020-07-13 DIAGNOSIS — Z1159 Encounter for screening for other viral diseases: Secondary | ICD-10-CM | POA: Diagnosis not present

## 2020-07-19 DIAGNOSIS — E039 Hypothyroidism, unspecified: Secondary | ICD-10-CM | POA: Diagnosis not present

## 2020-07-19 DIAGNOSIS — Z87891 Personal history of nicotine dependence: Secondary | ICD-10-CM | POA: Diagnosis not present

## 2020-07-19 DIAGNOSIS — F418 Other specified anxiety disorders: Secondary | ICD-10-CM | POA: Diagnosis not present

## 2020-07-19 DIAGNOSIS — M1611 Unilateral primary osteoarthritis, right hip: Secondary | ICD-10-CM | POA: Diagnosis not present

## 2020-07-19 DIAGNOSIS — E785 Hyperlipidemia, unspecified: Secondary | ICD-10-CM | POA: Diagnosis not present

## 2020-07-19 DIAGNOSIS — Z471 Aftercare following joint replacement surgery: Secondary | ICD-10-CM | POA: Diagnosis not present

## 2020-07-19 DIAGNOSIS — I10 Essential (primary) hypertension: Secondary | ICD-10-CM | POA: Diagnosis not present

## 2020-07-19 DIAGNOSIS — H3530 Unspecified macular degeneration: Secondary | ICD-10-CM | POA: Diagnosis not present

## 2020-07-19 DIAGNOSIS — Z96643 Presence of artificial hip joint, bilateral: Secondary | ICD-10-CM | POA: Diagnosis not present

## 2020-07-19 DIAGNOSIS — J302 Other seasonal allergic rhinitis: Secondary | ICD-10-CM | POA: Diagnosis not present

## 2020-07-20 DIAGNOSIS — E785 Hyperlipidemia, unspecified: Secondary | ICD-10-CM | POA: Diagnosis not present

## 2020-07-20 DIAGNOSIS — J302 Other seasonal allergic rhinitis: Secondary | ICD-10-CM | POA: Diagnosis not present

## 2020-07-20 DIAGNOSIS — M1611 Unilateral primary osteoarthritis, right hip: Secondary | ICD-10-CM | POA: Diagnosis not present

## 2020-07-20 DIAGNOSIS — I10 Essential (primary) hypertension: Secondary | ICD-10-CM | POA: Diagnosis not present

## 2020-07-20 DIAGNOSIS — H3530 Unspecified macular degeneration: Secondary | ICD-10-CM | POA: Diagnosis not present

## 2020-07-20 DIAGNOSIS — E039 Hypothyroidism, unspecified: Secondary | ICD-10-CM | POA: Diagnosis not present

## 2020-08-18 DIAGNOSIS — H353221 Exudative age-related macular degeneration, left eye, with active choroidal neovascularization: Secondary | ICD-10-CM | POA: Diagnosis not present

## 2020-09-05 DIAGNOSIS — M545 Low back pain, unspecified: Secondary | ICD-10-CM | POA: Diagnosis not present

## 2020-09-05 DIAGNOSIS — M7060 Trochanteric bursitis, unspecified hip: Secondary | ICD-10-CM | POA: Diagnosis not present

## 2020-09-19 DIAGNOSIS — Z96641 Presence of right artificial hip joint: Secondary | ICD-10-CM | POA: Diagnosis not present

## 2020-09-19 DIAGNOSIS — M1611 Unilateral primary osteoarthritis, right hip: Secondary | ICD-10-CM | POA: Diagnosis not present

## 2020-09-19 DIAGNOSIS — M25552 Pain in left hip: Secondary | ICD-10-CM | POA: Diagnosis not present

## 2020-09-22 DIAGNOSIS — H353221 Exudative age-related macular degeneration, left eye, with active choroidal neovascularization: Secondary | ICD-10-CM | POA: Diagnosis not present

## 2020-09-25 DIAGNOSIS — M6281 Muscle weakness (generalized): Secondary | ICD-10-CM | POA: Diagnosis not present

## 2020-09-25 DIAGNOSIS — M25412 Effusion, left shoulder: Secondary | ICD-10-CM | POA: Diagnosis not present

## 2020-09-25 DIAGNOSIS — M25612 Stiffness of left shoulder, not elsewhere classified: Secondary | ICD-10-CM | POA: Diagnosis not present

## 2020-09-25 DIAGNOSIS — M25512 Pain in left shoulder: Secondary | ICD-10-CM | POA: Diagnosis not present

## 2020-10-10 DIAGNOSIS — M25612 Stiffness of left shoulder, not elsewhere classified: Secondary | ICD-10-CM | POA: Diagnosis not present

## 2020-10-10 DIAGNOSIS — M25412 Effusion, left shoulder: Secondary | ICD-10-CM | POA: Diagnosis not present

## 2020-10-10 DIAGNOSIS — M25512 Pain in left shoulder: Secondary | ICD-10-CM | POA: Diagnosis not present

## 2020-10-10 DIAGNOSIS — M6281 Muscle weakness (generalized): Secondary | ICD-10-CM | POA: Diagnosis not present

## 2020-10-17 DIAGNOSIS — M545 Low back pain, unspecified: Secondary | ICD-10-CM | POA: Diagnosis not present

## 2020-10-19 DIAGNOSIS — M5136 Other intervertebral disc degeneration, lumbar region: Secondary | ICD-10-CM | POA: Diagnosis not present

## 2020-10-24 DIAGNOSIS — M25612 Stiffness of left shoulder, not elsewhere classified: Secondary | ICD-10-CM | POA: Diagnosis not present

## 2020-10-24 DIAGNOSIS — M25412 Effusion, left shoulder: Secondary | ICD-10-CM | POA: Diagnosis not present

## 2020-10-24 DIAGNOSIS — M25512 Pain in left shoulder: Secondary | ICD-10-CM | POA: Diagnosis not present

## 2020-10-24 DIAGNOSIS — M6281 Muscle weakness (generalized): Secondary | ICD-10-CM | POA: Diagnosis not present

## 2020-10-26 DIAGNOSIS — H353221 Exudative age-related macular degeneration, left eye, with active choroidal neovascularization: Secondary | ICD-10-CM | POA: Diagnosis not present

## 2020-10-31 DIAGNOSIS — Z6828 Body mass index (BMI) 28.0-28.9, adult: Secondary | ICD-10-CM | POA: Diagnosis not present

## 2020-10-31 DIAGNOSIS — L299 Pruritus, unspecified: Secondary | ICD-10-CM | POA: Diagnosis not present

## 2020-10-31 DIAGNOSIS — W57XXXA Bitten or stung by nonvenomous insect and other nonvenomous arthropods, initial encounter: Secondary | ICD-10-CM | POA: Diagnosis not present

## 2020-11-07 DIAGNOSIS — M25412 Effusion, left shoulder: Secondary | ICD-10-CM | POA: Diagnosis not present

## 2020-11-07 DIAGNOSIS — M6281 Muscle weakness (generalized): Secondary | ICD-10-CM | POA: Diagnosis not present

## 2020-11-07 DIAGNOSIS — M25612 Stiffness of left shoulder, not elsewhere classified: Secondary | ICD-10-CM | POA: Diagnosis not present

## 2020-11-07 DIAGNOSIS — M25512 Pain in left shoulder: Secondary | ICD-10-CM | POA: Diagnosis not present

## 2020-11-21 DIAGNOSIS — M25412 Effusion, left shoulder: Secondary | ICD-10-CM | POA: Diagnosis not present

## 2020-11-21 DIAGNOSIS — M25612 Stiffness of left shoulder, not elsewhere classified: Secondary | ICD-10-CM | POA: Diagnosis not present

## 2020-11-21 DIAGNOSIS — M6281 Muscle weakness (generalized): Secondary | ICD-10-CM | POA: Diagnosis not present

## 2020-11-21 DIAGNOSIS — M25512 Pain in left shoulder: Secondary | ICD-10-CM | POA: Diagnosis not present

## 2020-11-24 DIAGNOSIS — M47816 Spondylosis without myelopathy or radiculopathy, lumbar region: Secondary | ICD-10-CM | POA: Diagnosis not present

## 2020-11-24 DIAGNOSIS — M545 Low back pain, unspecified: Secondary | ICD-10-CM | POA: Diagnosis not present

## 2020-11-24 DIAGNOSIS — M4326 Fusion of spine, lumbar region: Secondary | ICD-10-CM | POA: Diagnosis not present

## 2020-11-27 DIAGNOSIS — M47816 Spondylosis without myelopathy or radiculopathy, lumbar region: Secondary | ICD-10-CM | POA: Diagnosis not present

## 2020-12-05 DIAGNOSIS — M25412 Effusion, left shoulder: Secondary | ICD-10-CM | POA: Diagnosis not present

## 2020-12-05 DIAGNOSIS — M25612 Stiffness of left shoulder, not elsewhere classified: Secondary | ICD-10-CM | POA: Diagnosis not present

## 2020-12-05 DIAGNOSIS — M25512 Pain in left shoulder: Secondary | ICD-10-CM | POA: Diagnosis not present

## 2020-12-05 DIAGNOSIS — M6281 Muscle weakness (generalized): Secondary | ICD-10-CM | POA: Diagnosis not present

## 2020-12-15 DIAGNOSIS — H353222 Exudative age-related macular degeneration, left eye, with inactive choroidal neovascularization: Secondary | ICD-10-CM | POA: Diagnosis not present

## 2020-12-19 DIAGNOSIS — M47816 Spondylosis without myelopathy or radiculopathy, lumbar region: Secondary | ICD-10-CM | POA: Diagnosis not present

## 2021-01-12 DIAGNOSIS — N390 Urinary tract infection, site not specified: Secondary | ICD-10-CM | POA: Diagnosis not present

## 2021-01-19 DIAGNOSIS — H353222 Exudative age-related macular degeneration, left eye, with inactive choroidal neovascularization: Secondary | ICD-10-CM | POA: Diagnosis not present

## 2021-01-25 DIAGNOSIS — Z87440 Personal history of urinary (tract) infections: Secondary | ICD-10-CM | POA: Diagnosis not present

## 2021-01-25 DIAGNOSIS — N930 Postcoital and contact bleeding: Secondary | ICD-10-CM | POA: Diagnosis not present

## 2021-01-25 DIAGNOSIS — R109 Unspecified abdominal pain: Secondary | ICD-10-CM | POA: Diagnosis not present

## 2021-01-25 DIAGNOSIS — N3000 Acute cystitis without hematuria: Secondary | ICD-10-CM | POA: Diagnosis not present

## 2021-01-25 DIAGNOSIS — R1030 Lower abdominal pain, unspecified: Secondary | ICD-10-CM | POA: Diagnosis not present

## 2021-01-29 DIAGNOSIS — M47816 Spondylosis without myelopathy or radiculopathy, lumbar region: Secondary | ICD-10-CM | POA: Diagnosis not present

## 2021-02-27 DIAGNOSIS — M47816 Spondylosis without myelopathy or radiculopathy, lumbar region: Secondary | ICD-10-CM | POA: Diagnosis not present

## 2021-03-15 DIAGNOSIS — H353221 Exudative age-related macular degeneration, left eye, with active choroidal neovascularization: Secondary | ICD-10-CM | POA: Diagnosis not present

## 2021-03-22 DIAGNOSIS — H353221 Exudative age-related macular degeneration, left eye, with active choroidal neovascularization: Secondary | ICD-10-CM | POA: Diagnosis not present

## 2021-03-29 DIAGNOSIS — R42 Dizziness and giddiness: Secondary | ICD-10-CM | POA: Diagnosis not present

## 2021-03-29 DIAGNOSIS — Z79899 Other long term (current) drug therapy: Secondary | ICD-10-CM | POA: Diagnosis not present

## 2021-04-12 DIAGNOSIS — M159 Polyosteoarthritis, unspecified: Secondary | ICD-10-CM | POA: Diagnosis not present

## 2021-04-12 DIAGNOSIS — E538 Deficiency of other specified B group vitamins: Secondary | ICD-10-CM | POA: Diagnosis not present

## 2021-04-12 DIAGNOSIS — E559 Vitamin D deficiency, unspecified: Secondary | ICD-10-CM | POA: Diagnosis not present

## 2021-04-12 DIAGNOSIS — Z23 Encounter for immunization: Secondary | ICD-10-CM | POA: Diagnosis not present

## 2021-04-12 DIAGNOSIS — E063 Autoimmune thyroiditis: Secondary | ICD-10-CM | POA: Diagnosis not present

## 2021-04-12 DIAGNOSIS — I10 Essential (primary) hypertension: Secondary | ICD-10-CM | POA: Diagnosis not present

## 2021-04-12 DIAGNOSIS — Z6829 Body mass index (BMI) 29.0-29.9, adult: Secondary | ICD-10-CM | POA: Diagnosis not present

## 2021-04-12 DIAGNOSIS — Z79899 Other long term (current) drug therapy: Secondary | ICD-10-CM | POA: Diagnosis not present

## 2021-04-12 DIAGNOSIS — E785 Hyperlipidemia, unspecified: Secondary | ICD-10-CM | POA: Diagnosis not present

## 2021-04-12 DIAGNOSIS — F411 Generalized anxiety disorder: Secondary | ICD-10-CM | POA: Diagnosis not present

## 2021-05-01 DIAGNOSIS — Z79899 Other long term (current) drug therapy: Secondary | ICD-10-CM | POA: Diagnosis not present

## 2021-05-01 DIAGNOSIS — I479 Paroxysmal tachycardia, unspecified: Secondary | ICD-10-CM | POA: Diagnosis not present

## 2021-05-01 DIAGNOSIS — F411 Generalized anxiety disorder: Secondary | ICD-10-CM | POA: Diagnosis not present

## 2021-05-01 DIAGNOSIS — Z6829 Body mass index (BMI) 29.0-29.9, adult: Secondary | ICD-10-CM | POA: Diagnosis not present

## 2021-05-03 DIAGNOSIS — H353221 Exudative age-related macular degeneration, left eye, with active choroidal neovascularization: Secondary | ICD-10-CM | POA: Diagnosis not present

## 2021-05-08 DIAGNOSIS — Z87440 Personal history of urinary (tract) infections: Secondary | ICD-10-CM | POA: Diagnosis not present

## 2021-06-14 DIAGNOSIS — H353221 Exudative age-related macular degeneration, left eye, with active choroidal neovascularization: Secondary | ICD-10-CM | POA: Diagnosis not present

## 2021-06-19 DIAGNOSIS — H3530 Unspecified macular degeneration: Secondary | ICD-10-CM | POA: Insufficient documentation

## 2021-06-19 DIAGNOSIS — R519 Headache, unspecified: Secondary | ICD-10-CM | POA: Insufficient documentation

## 2021-06-19 DIAGNOSIS — F419 Anxiety disorder, unspecified: Secondary | ICD-10-CM | POA: Insufficient documentation

## 2021-06-19 DIAGNOSIS — M1990 Unspecified osteoarthritis, unspecified site: Secondary | ICD-10-CM | POA: Insufficient documentation

## 2021-06-19 DIAGNOSIS — I10 Essential (primary) hypertension: Secondary | ICD-10-CM | POA: Insufficient documentation

## 2021-06-19 DIAGNOSIS — E039 Hypothyroidism, unspecified: Secondary | ICD-10-CM | POA: Insufficient documentation
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
## 2021-06-22 ENCOUNTER — Ambulatory Visit (INDEPENDENT_AMBULATORY_CARE_PROVIDER_SITE_OTHER): Payer: Medicare Other | Admitting: Cardiology

## 2021-06-22 ENCOUNTER — Encounter: Payer: Self-pay | Admitting: Cardiology

## 2021-06-22 ENCOUNTER — Other Ambulatory Visit: Payer: Self-pay

## 2021-06-22 ENCOUNTER — Ambulatory Visit (INDEPENDENT_AMBULATORY_CARE_PROVIDER_SITE_OTHER): Payer: Medicare Other

## 2021-06-22 VITALS — BP 158/78 | HR 69 | Ht 62.5 in | Wt 164.6 lb

## 2021-06-22 DIAGNOSIS — E039 Hypothyroidism, unspecified: Secondary | ICD-10-CM | POA: Diagnosis not present

## 2021-06-22 DIAGNOSIS — E782 Mixed hyperlipidemia: Secondary | ICD-10-CM | POA: Diagnosis not present

## 2021-06-22 DIAGNOSIS — R002 Palpitations: Secondary | ICD-10-CM | POA: Diagnosis not present

## 2021-06-22 DIAGNOSIS — I10 Essential (primary) hypertension: Secondary | ICD-10-CM

## 2021-06-22 MED ORDER — LOSARTAN POTASSIUM 50 MG PO TABS: 50.0000 mg | ORAL_TABLET | Freq: Every day | ORAL | 3 refills | Status: DC

## 2021-06-22 NOTE — Patient Instructions (Signed)
Medication Instructions:  Your physician has recommended you make the following change in your medication:  START: Losartan 50 mg take one tablet by mouth daily.  Please take your atenolol at bedtime.  *If you need a refill on your cardiac medications before your next appointment, please call your pharmacy*   Lab Work: None If you have labs (blood work) drawn today and your tests are completely normal, you will receive your results only by: [place] (if you have MyChart) OR A paper copy in the mail If you have any lab test that is abnormal or we need to change your treatment, we will call you to review the results.   Testing/Procedures: A zio monitor was ordered today. It will remain on for 7 days. You will then return monitor and event diary in provided box. It takes 1-2 weeks for report to be downloaded and returned to [place]. We will call you with the results. If monitor falls off or has orange flashing light, please call Zio for further instructions.     Follow-Up: At [facility], you and your health needs are our priority.  As part of our continuing mission to provide you with exceptional heart care, we have created designated Provider Care Teams.  These Care Teams include your primary Cardiologist (physician) and Advanced Practice Providers (APPs -  Physician Assistants and Nurse Practitioners) who all work together to provide you with the care you need, when you need it.  We recommend signing up for the patient portal called "MyChart".  Sign up information is provided on this After Visit Summary.  MyChart is used to connect with patients for Virtual Visits (Telemedicine).  Patients are able to view lab/test results, encounter notes, upcoming appointments, etc.  Non-urgent messages can be sent to your provider as well.   To learn more about what you can do with MyChart, go to [email or web address].    Your next appointment:   8 week(s)  The format for your next appointment:    In Person  Provider:   Shirlee More, MD    Other Instructions

## 2021-06-29 DIAGNOSIS — R002 Palpitations: Secondary | ICD-10-CM | POA: Diagnosis not present

## 2021-07-04 DIAGNOSIS — R002 Palpitations: Secondary | ICD-10-CM | POA: Diagnosis not present

## 2021-07-16 DIAGNOSIS — M5416 Radiculopathy, lumbar region: Secondary | ICD-10-CM | POA: Diagnosis not present

## 2021-07-16 DIAGNOSIS — M47816 Spondylosis without myelopathy or radiculopathy, lumbar region: Secondary | ICD-10-CM | POA: Diagnosis not present

## 2021-07-24 DIAGNOSIS — M5416 Radiculopathy, lumbar region: Secondary | ICD-10-CM | POA: Diagnosis not present

## 2021-08-01 DIAGNOSIS — H353222 Exudative age-related macular degeneration, left eye, with inactive choroidal neovascularization: Secondary | ICD-10-CM | POA: Diagnosis not present

## 2021-08-02 IMAGING — RF DG C-ARM 1-60 MIN-NO REPORT
1 series · 3 of 3 positions shown · non-contrast
Comparison: July 09, 2018.

CLINICAL DATA: Left hip replacement.

EXAM:
OPERATIVE left HIP (WITH PELVIS IF PERFORMED) 3 VIEWS
TECHNIQUE: Fluoroscopic spot image(s) were submitted for interpretation
post-operatively.
FLUOROSCOPY TIME:  16 seconds.

[Series 1: run · 3 of 3 slices shown]
[im 1/3]
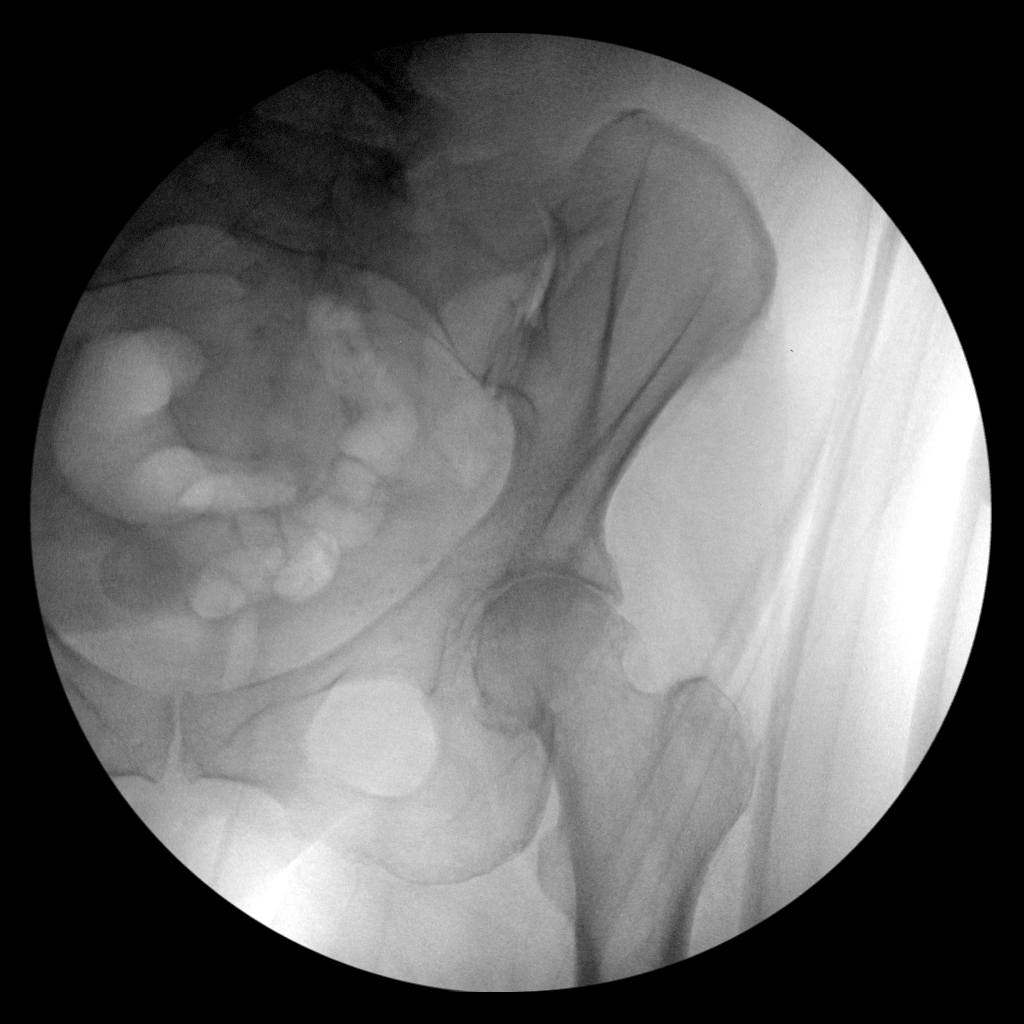
[im 2/3]
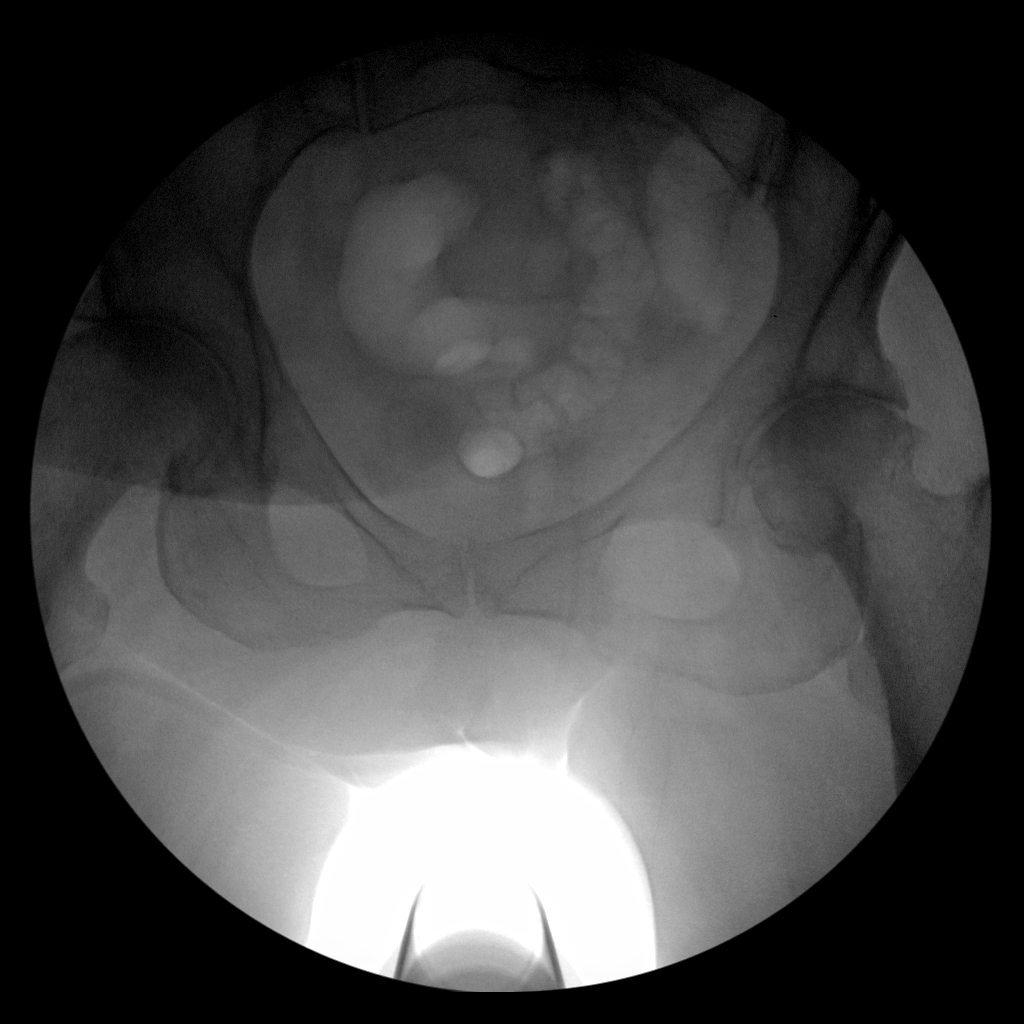
[im 3/3]
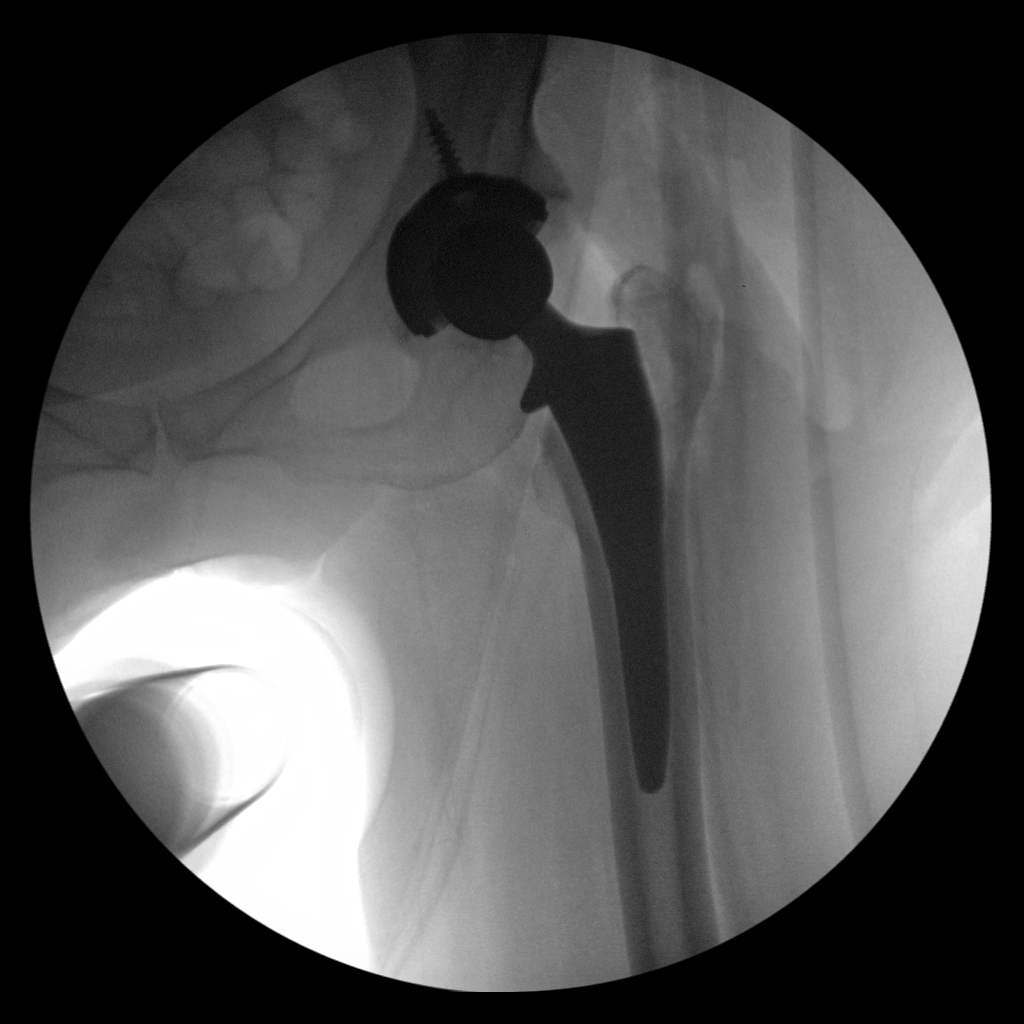

[3 of 3 positions shown; findings below may reference images not displayed]

FINDINGS: Three intraoperative fluoroscopic images were obtained of the left
hip. The left femoral and acetabular components appear to be well
situated.
IMPRESSION: Status post left total hip arthroplasty. See operative report for
further details.

## 2021-08-02 IMAGING — DX DG PORTABLE PELVIS
1 series · 1 of 1 positions shown · non-contrast
Comparison: Intraoperative views same date.

CLINICAL DATA: Left total hip replacement.

EXAM:
PORTABLE PELVIS 1-2 VIEWS

[pelvis ap]
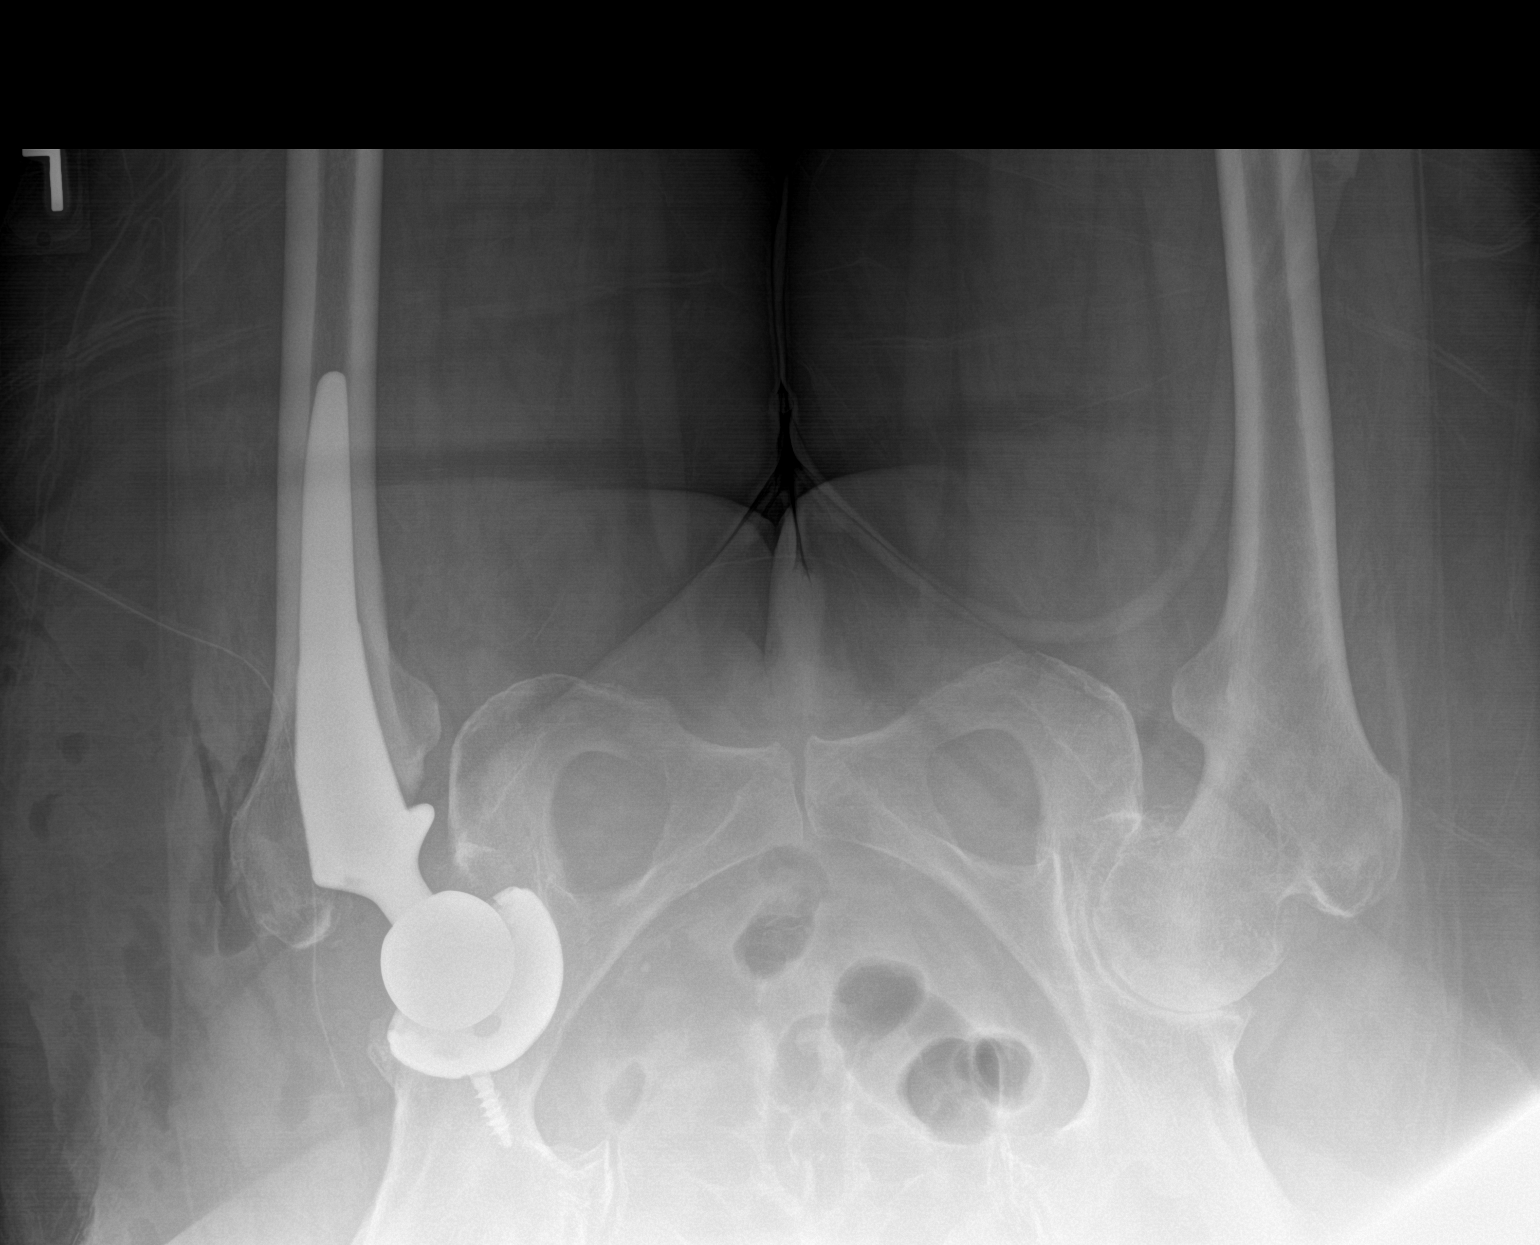

[1 of 1 positions shown; findings below may reference images not displayed]

FINDINGS: AP view of the lower pelvis demonstrates interval left total hip
arthroplasty with a screw fixed acetabular component. The hardware
is well positioned. There is no evidence of acute fracture or
dislocation. Mild right hip degenerative changes are present. There
is a left hip surgical drain and a small amount of gas in the soft
tissue surrounding the left hip.
IMPRESSION: No demonstrated complication following left total hip arthroplasty.
The hardware appears well positioned. Mild right hip degenerative
changes.

## 2021-08-14 DIAGNOSIS — M47816 Spondylosis without myelopathy or radiculopathy, lumbar region: Secondary | ICD-10-CM | POA: Diagnosis not present

## 2021-08-14 DIAGNOSIS — M5416 Radiculopathy, lumbar region: Secondary | ICD-10-CM | POA: Diagnosis not present

## 2021-08-15 DIAGNOSIS — N907 Vulvar cyst: Secondary | ICD-10-CM | POA: Diagnosis not present

## 2021-08-21 ENCOUNTER — Ambulatory Visit (INDEPENDENT_AMBULATORY_CARE_PROVIDER_SITE_OTHER): Payer: Medicare Other | Admitting: Cardiology

## 2021-08-21 ENCOUNTER — Encounter: Payer: Self-pay | Admitting: Cardiology

## 2021-08-21 VITALS — BP 112/78 | HR 74 | Ht 62.5 in | Wt 167.8 lb

## 2021-08-21 DIAGNOSIS — E782 Mixed hyperlipidemia: Secondary | ICD-10-CM | POA: Diagnosis not present

## 2021-08-21 DIAGNOSIS — I471 Supraventricular tachycardia: Secondary | ICD-10-CM | POA: Diagnosis not present

## 2021-08-21 DIAGNOSIS — I10 Essential (primary) hypertension: Secondary | ICD-10-CM

## 2021-08-21 MED ORDER — ACEBUTOLOL HCL 200 MG PO CAPS: 200.0000 mg | ORAL_CAPSULE | Freq: Every day | ORAL | 5 refills | Status: DC
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
## 2021-08-21 NOTE — Patient Instructions (Signed)
1. Avoid all over-the-counter antihistamines except Claritin/Loratadine and Zyrtec/Cetrizine. ?2. Avoid all combination including cold sinus allergies flu decongestant and sleep medications ?3. You can use Robitussin DM Mucinex and Mucinex DM for cough. ?4. can use Tylenol aspirin ibuprofen and naproxen but no combinations such as sleep or sinus. ?Medication Instructions:  ?Your physician has recommended you make the following change in your medication:  ?Discontinue Atenolol ?Start Acebutolol 200 mg daily in the early evening ? ?*If you need a refill on your cardiac medications before your next appointment, please call your pharmacy* ? ? ?Lab Work: ?NONE ?If you have labs (blood work) drawn today and your tests are completely normal, you will receive your results only by: ?MyChart Message (if you have MyChart) OR ?A paper copy in the mail ?If you have any lab test that is abnormal or we need to change your treatment, we will call you to review the results. ? ? ?Testing/Procedures: ?NONE ? ? ?Follow-Up: ?At [facility], you and your health needs are our priority.  As part of our continuing mission to provide you with exceptional heart care, we have created designated Provider Care Teams.  These Care Teams include your primary Cardiologist (physician) and Advanced Practice Providers (APPs -  Physician Assistants and Nurse Practitioners) who all work together to provide you with the care you need, when you need it. ? ?We recommend signing up for the patient portal called "MyChart".  Sign up information is provided on this After Visit Summary.  MyChart is used to connect with patients for Virtual Visits (Telemedicine).  Patients are able to view lab/test results, encounter notes, upcoming appointments, etc.  Non-urgent messages can be sent to your provider as well.   ?To learn more about what you can do with MyChart, go to [email or web address].   ? ?Your next appointment:   ?3 month(s) ? ?The format for your next  appointment:   ?In Person ? ?Provider:   ?Shirlee More, MD  ? ? ?Other Instructions ?  ?

## 2021-08-30 DIAGNOSIS — H353112 Nonexudative age-related macular degeneration, right eye, intermediate dry stage: Secondary | ICD-10-CM | POA: Diagnosis not present

## 2021-08-30 DIAGNOSIS — H353222 Exudative age-related macular degeneration, left eye, with inactive choroidal neovascularization: Secondary | ICD-10-CM | POA: Diagnosis not present

## 2021-09-17 ENCOUNTER — Telehealth: Payer: Self-pay | Admitting: Cardiology

## 2021-09-17 NOTE — Telephone Encounter (Signed)
Pt c/o medication issue: ? ?1. Name of Medication: niacin 100 mg ? ?2. How are you currently taking this medication (dosage and times per day)? Took 1 tablet ? ?3. Are you having a reaction (difficulty breathing--STAT)? no ? ?4. What is your medication issue? Sonia Side, patient's care giver, states the patient took niacin and after 15 minutes she started having a reaction. She says she her face became red and she has itching. Patient was not having any difficulty breathing. They would like to know if the patient can take benadryl. Phone: [phone] ? ?

## 2021-09-17 NOTE — Telephone Encounter (Signed)
Advised of Dr. Joya Gaskins recommendations. Pt verbalized understanding and had no additional questions. ?

## 2021-09-28 DIAGNOSIS — R350 Frequency of micturition: Secondary | ICD-10-CM | POA: Diagnosis not present

## 2021-09-28 DIAGNOSIS — N390 Urinary tract infection, site not specified: Secondary | ICD-10-CM | POA: Diagnosis not present

## 2021-09-28 DIAGNOSIS — Z6830 Body mass index (BMI) 30.0-30.9, adult: Secondary | ICD-10-CM | POA: Diagnosis not present

## 2021-10-11 ENCOUNTER — Telehealth: Payer: Self-pay | Admitting: Cardiology

## 2021-10-11 NOTE — Telephone Encounter (Signed)
Patient called about her tachycardia.  She didn't have any HR readings to give me.  She stated she knows it increases periodically. She wants to know what she should do.  ?

## 2021-10-19 ENCOUNTER — Ambulatory Visit (INDEPENDENT_AMBULATORY_CARE_PROVIDER_SITE_OTHER): Payer: Medicare Other

## 2021-10-19 VITALS — BP 120/72 | HR 73 | Ht 62.5 in | Wt 168.0 lb

## 2021-10-19 DIAGNOSIS — I471 Supraventricular tachycardia: Secondary | ICD-10-CM | POA: Diagnosis not present

## 2021-10-19 NOTE — Progress Notes (Signed)
   Nurse Visit   Date of Encounter: 10/19/2021 ID: Melissa Love, DOB 1936-03-17, MRN [id number]  PCP:  Helen Hashimoto., MD   [facility] Providers Cardiologist:  Dr. Barbaraann Rondo to update primary MD,subspecialty MD or APP then REFRESH:1}     Visit Details   VS:  BP 120/72 (BP Location: Left Arm, Patient Position: Sitting, Cuff Size: Normal)   Pulse 73   Ht 5' 2.5" (1.588 m)   Wt 168 lb (76.2 kg)   SpO2 97%   BMI 30.24 kg/m  , BMI Body mass index is 30.24 kg/m.  Wt Readings from Last 3 Encounters:  10/19/21 168 lb (76.2 kg)  08/21/21 167 lb 12.8 oz (76.1 kg)  06/22/21 164 lb 9.6 oz (74.7 kg)     Reason for visit: Check EKG and Blood Pressure after having episodes of tachycardia Performed today: Vitals, EKG, Provider consulted and education Changes (medications, testing, etc.) : No new orders at this time Length of Visit: 25 minutes    Medications Adjustments/Labs and Tests Ordered: No orders of the defined types were placed in this encounter.  No orders of the defined types were placed in this encounter.    Signed, Louie Casa, RN  10/19/2021 3:41 PM

## 2021-10-25 DIAGNOSIS — H353112 Nonexudative age-related macular degeneration, right eye, intermediate dry stage: Secondary | ICD-10-CM | POA: Diagnosis not present

## 2021-10-25 DIAGNOSIS — H353221 Exudative age-related macular degeneration, left eye, with active choroidal neovascularization: Secondary | ICD-10-CM | POA: Diagnosis not present

## 2021-11-05 DIAGNOSIS — E785 Hyperlipidemia, unspecified: Secondary | ICD-10-CM | POA: Diagnosis not present

## 2021-11-05 DIAGNOSIS — I10 Essential (primary) hypertension: Secondary | ICD-10-CM | POA: Diagnosis not present

## 2021-11-05 DIAGNOSIS — E559 Vitamin D deficiency, unspecified: Secondary | ICD-10-CM | POA: Diagnosis not present

## 2021-11-05 DIAGNOSIS — I479 Paroxysmal tachycardia, unspecified: Secondary | ICD-10-CM | POA: Diagnosis not present

## 2021-11-05 DIAGNOSIS — F411 Generalized anxiety disorder: Secondary | ICD-10-CM | POA: Diagnosis not present

## 2021-11-05 DIAGNOSIS — D692 Other nonthrombocytopenic purpura: Secondary | ICD-10-CM | POA: Diagnosis not present

## 2021-11-05 DIAGNOSIS — Z6830 Body mass index (BMI) 30.0-30.9, adult: Secondary | ICD-10-CM | POA: Diagnosis not present

## 2021-11-05 DIAGNOSIS — M159 Polyosteoarthritis, unspecified: Secondary | ICD-10-CM | POA: Diagnosis not present

## 2021-11-05 DIAGNOSIS — E538 Deficiency of other specified B group vitamins: Secondary | ICD-10-CM | POA: Diagnosis not present

## 2021-11-05 DIAGNOSIS — Z9181 History of falling: Secondary | ICD-10-CM | POA: Diagnosis not present

## 2021-11-05 DIAGNOSIS — Z139 Encounter for screening, unspecified: Secondary | ICD-10-CM | POA: Diagnosis not present

## 2021-11-05 DIAGNOSIS — E063 Autoimmune thyroiditis: Secondary | ICD-10-CM | POA: Diagnosis not present

## 2021-11-07 DIAGNOSIS — Z87440 Personal history of urinary (tract) infections: Secondary | ICD-10-CM | POA: Diagnosis not present
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
## 2021-11-21 ENCOUNTER — Encounter: Payer: Self-pay | Admitting: Cardiology

## 2021-11-21 ENCOUNTER — Ambulatory Visit (INDEPENDENT_AMBULATORY_CARE_PROVIDER_SITE_OTHER): Payer: Medicare Other | Admitting: Cardiology

## 2021-11-21 VITALS — BP 158/72 | HR 67 | Ht 62.5 in | Wt 163.2 lb

## 2021-11-21 DIAGNOSIS — I471 Supraventricular tachycardia: Secondary | ICD-10-CM

## 2021-11-21 DIAGNOSIS — I10 Essential (primary) hypertension: Secondary | ICD-10-CM | POA: Diagnosis not present

## 2021-11-21 MED ORDER — ACEBUTOLOL HCL 200 MG PO CAPS: 200.0000 mg | ORAL_CAPSULE | Freq: Two times a day (BID) | ORAL | 1 refills | Status: DC

## 2021-11-21 NOTE — Patient Instructions (Signed)
Medication Instructions:  Your physician has recommended you make the following change in your medication:  START: Acebutolol 200 mg twice daily (If not better in 2 weeks we will switch)  *If you need a refill on your cardiac medications before your next appointment, please call your pharmacy*   Lab Work: None If you have labs (blood work) drawn today and your tests are completely normal, you will receive your results only by: [place] (if you have MyChart) OR A paper copy in the mail If you have any lab test that is abnormal or we need to change your treatment, we will call you to review the results.   Testing/Procedures: None   Follow-Up: At [facility], you and your health needs are our priority.  As part of our continuing mission to provide you with exceptional heart care, we have created designated Provider Care Teams.  These Care Teams include your primary Cardiologist (physician) and Advanced Practice Providers (APPs -  Physician Assistants and Nurse Practitioners) who all work together to provide you with the care you need, when you need it.  We recommend signing up for the patient portal called "MyChart".  Sign up information is provided on this After Visit Summary.  MyChart is used to connect with patients for Virtual Visits (Telemedicine).  Patients are able to view lab/test results, encounter notes, upcoming appointments, etc.  Non-urgent messages can be sent to your provider as well.   To learn more about what you can do with MyChart, go to [email or web address].    Your next appointment:   6 month(s)  The format for your next appointment:   In Person  Provider:   Shirlee More, MD    Other Instructions None  Important Information About Sugar

## 2021-12-06 DIAGNOSIS — H353221 Exudative age-related macular degeneration, left eye, with active choroidal neovascularization: Secondary | ICD-10-CM | POA: Diagnosis not present

## 2021-12-17 DIAGNOSIS — B029 Zoster without complications: Secondary | ICD-10-CM | POA: Diagnosis not present

## 2021-12-17 DIAGNOSIS — R21 Rash and other nonspecific skin eruption: Secondary | ICD-10-CM | POA: Diagnosis not present

## 2021-12-18 ENCOUNTER — Telehealth: Payer: Self-pay | Admitting: Cardiology

## 2021-12-18 NOTE — Telephone Encounter (Signed)
There are no drug interactions with prednisone and her acebutolol or losartan.  But, prednisone can cause tachycardia, arrhythmias and palpitations.

## 2021-12-18 NOTE — Telephone Encounter (Signed)
Patient c/o Palpitations:  High priority if patient c/o lightheadedness, shortness of breath, or chest pain  How long have you had palpitations/irregular HR/ Afib? Are you having the symptoms now?  Patient states last night she had 2 episodes of palpitations/rapid HR. She assumes this was due to taking medication for Shingles. She states she also felt palpitations this morning, but no symptoms currently.  Are you currently experiencing lightheadedness, SOB or CP?  No   Do you have a history of afib (atrial fibrillation) or irregular heart rhythm?    Have you checked your BP or HR? (document readings if available):  No   Are you experiencing any other symptoms?  No    Patient also mentions yesterday she was put on Prednisone and Valtex 1 MG (3X daily) for shingles. She would like to make sure this will not interfere with any cardiac medications. Please advise.

## 2021-12-19 NOTE — Telephone Encounter (Signed)
No VM set up.

## 2021-12-20 ENCOUNTER — Telehealth: Payer: Self-pay

## 2021-12-20 NOTE — Telephone Encounter (Signed)
Spoke with pt regarding message. She stated that she was taking the prednisone and feeling some Tachycardia, but was able to manage. She stated that her shingles were "drying up". She had no further questions or concerns.

## 2021-12-31 DIAGNOSIS — M25512 Pain in left shoulder: Secondary | ICD-10-CM | POA: Diagnosis not present

## 2021-12-31 DIAGNOSIS — G8929 Other chronic pain: Secondary | ICD-10-CM | POA: Diagnosis not present

## 2022-01-03 DIAGNOSIS — H353221 Exudative age-related macular degeneration, left eye, with active choroidal neovascularization: Secondary | ICD-10-CM | POA: Diagnosis not present

## 2022-01-10 DIAGNOSIS — S29019A Strain of muscle and tendon of unspecified wall of thorax, initial encounter: Secondary | ICD-10-CM | POA: Diagnosis not present

## 2022-01-29 ENCOUNTER — Other Ambulatory Visit: Payer: Self-pay

## 2022-01-29 MED ORDER — ACEBUTOLOL HCL 200 MG PO CAPS: 200.0000 mg | ORAL_CAPSULE | Freq: Two times a day (BID) | ORAL | 3 refills | Status: DC

## 2022-01-29 NOTE — Telephone Encounter (Signed)
Acebutolol HCL 200 mg # 60 capsules x 3 refills sent to [facility]

## 2022-02-07 DIAGNOSIS — H353221 Exudative age-related macular degeneration, left eye, with active choroidal neovascularization: Secondary | ICD-10-CM | POA: Diagnosis not present

## 2022-02-07 DIAGNOSIS — H353112 Nonexudative age-related macular degeneration, right eye, intermediate dry stage: Secondary | ICD-10-CM | POA: Diagnosis not present

## 2022-02-26 DIAGNOSIS — L578 Other skin changes due to chronic exposure to nonionizing radiation: Secondary | ICD-10-CM | POA: Diagnosis not present

## 2022-02-26 DIAGNOSIS — L820 Inflamed seborrheic keratosis: Secondary | ICD-10-CM | POA: Diagnosis not present

## 2022-03-28 DIAGNOSIS — H353221 Exudative age-related macular degeneration, left eye, with active choroidal neovascularization: Secondary | ICD-10-CM | POA: Diagnosis not present

## 2022-04-10 DIAGNOSIS — Z23 Encounter for immunization: Secondary | ICD-10-CM | POA: Diagnosis not present

## 2022-04-29 DIAGNOSIS — R350 Frequency of micturition: Secondary | ICD-10-CM | POA: Diagnosis not present

## 2022-04-29 DIAGNOSIS — Z6830 Body mass index (BMI) 30.0-30.9, adult: Secondary | ICD-10-CM | POA: Diagnosis not present

## 2022-05-01 DIAGNOSIS — G8929 Other chronic pain: Secondary | ICD-10-CM | POA: Diagnosis not present

## 2022-05-01 DIAGNOSIS — M25512 Pain in left shoulder: Secondary | ICD-10-CM | POA: Diagnosis not present

## 2022-05-08 DIAGNOSIS — N390 Urinary tract infection, site not specified: Secondary | ICD-10-CM | POA: Diagnosis not present

## 2022-05-08 DIAGNOSIS — R350 Frequency of micturition: Secondary | ICD-10-CM | POA: Diagnosis not present

## 2022-05-08 DIAGNOSIS — Z87440 Personal history of urinary (tract) infections: Secondary | ICD-10-CM | POA: Diagnosis not present

## 2022-05-21 ENCOUNTER — Ambulatory Visit: Payer: Medicare Other | Admitting: Cardiology

## 2022-05-23 DIAGNOSIS — H353112 Nonexudative age-related macular degeneration, right eye, intermediate dry stage: Secondary | ICD-10-CM | POA: Diagnosis not present

## 2022-05-23 DIAGNOSIS — H353221 Exudative age-related macular degeneration, left eye, with active choroidal neovascularization: Secondary | ICD-10-CM | POA: Diagnosis not present

## 2022-05-29 ENCOUNTER — Other Ambulatory Visit: Payer: Self-pay

## 2022-05-29 MED ORDER — ACEBUTOLOL HCL 200 MG PO CAPS: 200.0000 mg | ORAL_CAPSULE | Freq: Two times a day (BID) | ORAL | 1 refills | Status: DC

## 2022-05-30 DIAGNOSIS — Z87440 Personal history of urinary (tract) infections: Secondary | ICD-10-CM | POA: Diagnosis not present

## 2022-05-30 DIAGNOSIS — N390 Urinary tract infection, site not specified: Secondary | ICD-10-CM | POA: Diagnosis not present
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
## 2022-06-07 ENCOUNTER — Ambulatory Visit: Payer: Medicare Other | Attending: Cardiology | Admitting: Cardiology

## 2022-06-07 ENCOUNTER — Other Ambulatory Visit: Payer: Self-pay | Admitting: Cardiology

## 2022-06-07 ENCOUNTER — Encounter: Payer: Self-pay | Admitting: Cardiology

## 2022-06-07 VITALS — BP 125/71 | HR 70 | Ht 63.0 in | Wt 164.0 lb

## 2022-06-07 DIAGNOSIS — I10 Essential (primary) hypertension: Secondary | ICD-10-CM | POA: Diagnosis not present

## 2022-06-07 DIAGNOSIS — I4719 Other supraventricular tachycardia: Secondary | ICD-10-CM | POA: Insufficient documentation

## 2022-06-07 NOTE — Patient Instructions (Signed)
Medication Instructions:  Your physician recommends that you continue on your current medications as directed. Please refer to the Current Medication list given to you today.  *If you need a refill on your cardiac medications before your next appointment, please call your pharmacy*   Lab Work: NONE If you have labs (blood work) drawn today and your tests are completely normal, you will receive your results only by: MyChart Message (if you have MyChart) OR A paper copy in the mail If you have any lab test that is abnormal or we need to change your treatment, we will call you to review the results.   Testing/Procedures: NONE   Follow-Up: At [facility], you and your health needs are our priority.  As part of our continuing mission to provide you with exceptional heart care, we have created designated Provider Care Teams.  These Care Teams include your primary Cardiologist (physician) and Advanced Practice Providers (APPs -  Physician Assistants and Nurse Practitioners) who all work together to provide you with the care you need, when you need it.  We recommend signing up for the patient portal called "MyChart".  Sign up information is provided on this After Visit Summary.  MyChart is used to connect with patients for Virtual Visits (Telemedicine).  Patients are able to view lab/test results, encounter notes, upcoming appointments, etc.  Non-urgent messages can be sent to your provider as well.   To learn more about what you can do with MyChart, go to [email or web address].    Your next appointment:   1 year(s)  The format for your next appointment:   In Person  Provider:   Brian Munley, MD    Other Instructions   Important Information About Sugar       

## 2022-06-12 DIAGNOSIS — H524 Presbyopia: Secondary | ICD-10-CM | POA: Diagnosis not present

## 2022-07-21 ENCOUNTER — Inpatient Hospital Stay (HOSPITAL_BASED_OUTPATIENT_CLINIC_OR_DEPARTMENT_OTHER)
Admission: EM | Admit: 2022-07-21 | Discharge: 2022-07-23 | DRG: 068 | Disposition: A | Discharge status: Home / Self Care | Payer: Medicare Other | Attending: Internal Medicine | Admitting: Internal Medicine

## 2022-07-21 ENCOUNTER — Encounter (HOSPITAL_BASED_OUTPATIENT_CLINIC_OR_DEPARTMENT_OTHER): Payer: Self-pay

## 2022-07-21 ENCOUNTER — Emergency Department (HOSPITAL_BASED_OUTPATIENT_CLINIC_OR_DEPARTMENT_OTHER): Payer: Medicare Other

## 2022-07-21 DIAGNOSIS — Z96642 Presence of left artificial hip joint: Secondary | ICD-10-CM | POA: Diagnosis present

## 2022-07-21 DIAGNOSIS — Z79890 Hormone replacement therapy (postmenopausal): Secondary | ICD-10-CM | POA: Diagnosis not present

## 2022-07-21 DIAGNOSIS — I4719 Other supraventricular tachycardia: Secondary | ICD-10-CM | POA: Diagnosis present

## 2022-07-21 DIAGNOSIS — Z888 Allergy status to other drugs, medicaments and biological substances status: Secondary | ICD-10-CM

## 2022-07-21 DIAGNOSIS — R2681 Unsteadiness on feet: Secondary | ICD-10-CM | POA: Diagnosis present

## 2022-07-21 DIAGNOSIS — I10 Essential (primary) hypertension: Secondary | ICD-10-CM | POA: Diagnosis present

## 2022-07-21 DIAGNOSIS — R32 Unspecified urinary incontinence: Secondary | ICD-10-CM | POA: Diagnosis present

## 2022-07-21 DIAGNOSIS — Z87440 Personal history of urinary (tract) infections: Secondary | ICD-10-CM | POA: Diagnosis present

## 2022-07-21 DIAGNOSIS — I6782 Cerebral ischemia: Secondary | ICD-10-CM | POA: Diagnosis not present

## 2022-07-21 DIAGNOSIS — H543 Unqualified visual loss, both eyes: Secondary | ICD-10-CM | POA: Diagnosis present

## 2022-07-21 DIAGNOSIS — I6502 Occlusion and stenosis of left vertebral artery: Principal | ICD-10-CM | POA: Diagnosis present

## 2022-07-21 DIAGNOSIS — F32A Depression, unspecified: Secondary | ICD-10-CM | POA: Diagnosis present

## 2022-07-21 DIAGNOSIS — Z882 Allergy status to sulfonamides status: Secondary | ICD-10-CM

## 2022-07-21 DIAGNOSIS — K219 Gastro-esophageal reflux disease without esophagitis: Secondary | ICD-10-CM | POA: Diagnosis present

## 2022-07-21 DIAGNOSIS — Z880 Allergy status to penicillin: Secondary | ICD-10-CM | POA: Diagnosis not present

## 2022-07-21 DIAGNOSIS — R29700 NIHSS score 0: Secondary | ICD-10-CM | POA: Diagnosis present

## 2022-07-21 DIAGNOSIS — M542 Cervicalgia: Secondary | ICD-10-CM | POA: Diagnosis present

## 2022-07-21 DIAGNOSIS — Z79899 Other long term (current) drug therapy: Secondary | ICD-10-CM | POA: Diagnosis not present

## 2022-07-21 DIAGNOSIS — E039 Hypothyroidism, unspecified: Secondary | ICD-10-CM | POA: Diagnosis present

## 2022-07-21 DIAGNOSIS — Z8249 Family history of ischemic heart disease and other diseases of the circulatory system: Secondary | ICD-10-CM | POA: Diagnosis not present

## 2022-07-21 DIAGNOSIS — G43109 Migraine with aura, not intractable, without status migrainosus: Secondary | ICD-10-CM | POA: Diagnosis not present

## 2022-07-21 DIAGNOSIS — H3530 Unspecified macular degeneration: Secondary | ICD-10-CM | POA: Diagnosis present

## 2022-07-21 DIAGNOSIS — Z87891 Personal history of nicotine dependence: Secondary | ICD-10-CM

## 2022-07-21 DIAGNOSIS — Z885 Allergy status to narcotic agent status: Secondary | ICD-10-CM

## 2022-07-21 DIAGNOSIS — G459 Transient cerebral ischemic attack, unspecified: Principal | ICD-10-CM | POA: Diagnosis present

## 2022-07-21 DIAGNOSIS — N390 Urinary tract infection, site not specified: Secondary | ICD-10-CM | POA: Diagnosis present

## 2022-07-21 DIAGNOSIS — I471 Supraventricular tachycardia, unspecified: Secondary | ICD-10-CM | POA: Diagnosis present

## 2022-07-21 DIAGNOSIS — E785 Hyperlipidemia, unspecified: Secondary | ICD-10-CM | POA: Diagnosis not present

## 2022-07-21 DIAGNOSIS — F419 Anxiety disorder, unspecified: Secondary | ICD-10-CM | POA: Diagnosis present

## 2022-07-21 DIAGNOSIS — R531 Weakness: Secondary | ICD-10-CM | POA: Diagnosis not present

## 2022-07-21 DIAGNOSIS — R29818 Other symptoms and signs involving the nervous system: Secondary | ICD-10-CM | POA: Diagnosis not present

## 2022-07-21 LAB — CBC WITH DIFFERENTIAL/PLATELET
Abs Immature Granulocytes: 0.01 10*3/uL (ref 0.00–0.07)
Basophils Absolute: 0.1 10*3/uL (ref 0.0–0.1)
Basophils Relative: 1 %
Eosinophils Absolute: 0.1 10*3/uL (ref 0.0–0.5)
Eosinophils Relative: 2 %
HCT: 44 % (ref 36.0–46.0)
Hemoglobin: 14.4 g/dL (ref 12.0–15.0)
Immature Granulocytes: 0 %
Lymphocytes Relative: 36 %
Lymphs Abs: 2.1 10*3/uL (ref 0.7–4.0)
MCH: 30.8 pg (ref 26.0–34.0)
MCHC: 32.7 g/dL (ref 30.0–36.0)
MCV: 94.2 fL (ref 80.0–100.0)
Monocytes Absolute: 0.3 10*3/uL (ref 0.1–1.0)
Monocytes Relative: 5 %
Neutro Abs: 3.3 10*3/uL (ref 1.7–7.7)
Neutrophils Relative %: 56 %
Platelets: 221 10*3/uL (ref 150–400)
RBC: 4.67 MIL/uL (ref 3.87–5.11)
RDW: 14.1 % (ref 11.5–15.5)
WBC: 5.9 10*3/uL (ref 4.0–10.5)
nRBC: 0 % (ref 0.0–0.2)

## 2022-07-21 LAB — COMPREHENSIVE METABOLIC PANEL
ALT: 23 U/L (ref 0–44)
AST: 18 U/L (ref 15–41)
Albumin: 4.7 g/dL (ref 3.5–5.0)
Alkaline Phosphatase: 90 U/L (ref 38–126)
Anion gap: 10 (ref 5–15)
BUN: 17 mg/dL (ref 8–23)
CO2: 28 mmol/L (ref 22–32)
Calcium: 10 mg/dL (ref 8.9–10.3)
Chloride: 105 mmol/L (ref 98–111)
Creatinine, Ser: 0.61 mg/dL (ref 0.44–1.00)
GFR, Estimated: >60 mL/min (ref >60)
Glucose, Bld: 152 mg/dL — ABNORMAL HIGH (ref 70–99)
Potassium: 3.7 mmol/L (ref 3.5–5.1)
Sodium: 143 mmol/L (ref 135–145)
Total Bilirubin: 0.3 mg/dL (ref 0.3–1.2)
Total Protein: 7 g/dL (ref 6.5–8.1)

## 2022-07-21 MED ORDER — HEPARIN (PORCINE) 25000 UT/250ML-% IV SOLN
900.0000 [IU]/h | INTRAVENOUS | Status: DC
  Administered 2022-07-21: 850 [IU]/h via INTRAVENOUS
  Administered 2022-07-23: 900 [IU]/h via INTRAVENOUS
  Filled 2022-07-21 (×2): qty 250

## 2022-07-21 MED ORDER — SODIUM CHLORIDE 0.9 % IV BOLUS
1000.0000 mL | Freq: Once | INTRAVENOUS | Status: AC
  Administered 2022-07-21: 1000 mL via INTRAVENOUS

## 2022-07-21 MED ORDER — CEPHALEXIN 250 MG PO CAPS
250.0000 mg | ORAL_CAPSULE | Freq: Every day | ORAL | Status: DC
  Filled 2022-07-21: qty 1

## 2022-07-21 MED ORDER — ASPIRIN 81 MG PO CHEW
324.0000 mg | CHEWABLE_TABLET | Freq: Once | ORAL | Status: AC
  Administered 2022-07-21: 324 mg via ORAL
  Filled 2022-07-21: qty 4

## 2022-07-21 MED ORDER — IOHEXOL 350 MG/ML SOLN
100.0000 mL | Freq: Once | INTRAVENOUS | Status: AC | PRN
  Administered 2022-07-21: 75 mL via INTRAVENOUS

## 2022-07-21 MED ORDER — ALPRAZOLAM 0.5 MG PO TABS
0.5000 mg | ORAL_TABLET | Freq: Two times a day (BID) | ORAL | Status: DC | PRN
  Administered 2022-07-22: 0.5 mg via ORAL
  Filled 2022-07-21: qty 1

## 2022-07-21 MED ORDER — SERTRALINE HCL 50 MG PO TABS
50.0000 mg | ORAL_TABLET | Freq: Every day | ORAL | Status: DC
  Administered 2022-07-22 (×2): 50 mg via ORAL
  Filled 2022-07-21: qty 2
  Filled 2022-07-21: qty 1

## 2022-07-21 NOTE — ED Triage Notes (Signed)
Pt c/o hypertension, states that at approx 5p, she was opening refrigerator while making something to eat & "vision was suddenly not focused & went black," legs felt weak & hands were cold. States she took home dose atenolol. States that she feels "real tired, w a strange HA- but I've had that for the past couple days." Stroke screen clear at time of triage, no unilateral deficit noted at time of triage.

## 2022-07-21 NOTE — ED Provider Notes (Signed)
[facility] Provider Note   CSN: [id number] Arrival date & time: 07/21/22  1931     History  Chief Complaint  Patient presents with   Hypertension         Melissa Love is a 87 y.o. female.  87 yo F with a chief complaints for an episode where she lost vision.  She thinks this lasted for about 30 seconds.  She was making chicken salad and suddenly felt unwell and she walked over to the refrigerator and realized that she could not see anything.  Nothing out of both eyes.  She held onto the refrigerator and had completed.  During this event she denies any headache denies neck pain denies one-sided numbness or weakness denies difficulty with speech or swallowing.  She has had a headache about the frontal region that is been going on for the past couple days.  Did not notice much of a headache today.  She is having some left-sided neck pain.   Hypertension       Home Medications Prior to Admission medications   Medication Sig Start Date End Date Taking? Authorizing Provider  acebutolol (SECTRAL) 200 MG capsule Take 1 capsule (200 mg total) by mouth 2 (two) times daily. 05/29/22   Richardo Priest, MD  acetaminophen (TYLENOL) 650 MG CR tablet Take 650 mg by mouth every 8 (eight) hours as needed for pain.    [provider]  calcium carbonate (TUMS - DOSED IN MG ELEMENTAL CALCIUM) 500 MG chewable tablet Chew 1 tablet by mouth daily as needed for indigestion or heartburn.    [provider]  cephALEXin (KEFLEX) 250 MG capsule Take 250 mg by mouth daily.    [provider]  cetirizine (ZYRTEC) 10 MG tablet Take 10 mg by mouth daily.    [provider]  Cholecalciferol (VITAMIN D) 50 MCG (2000 UT) tablet Take 2,000 Units by mouth daily.    [provider]  Coenzyme Q10 (COQ10) 100 MG CAPS Take 100 mg by mouth daily.    [provider]  fluticasone (FLONASE) 50 MCG/ACT nasal spray Place 2  sprays into both nostrils daily.    [provider]  hydroxypropyl methylcellulose / hypromellose (ISOPTO TEARS / GONIOVISC) 2.5 % ophthalmic solution Place 1 drop into both eyes as needed for dry eyes.    [provider]  levothyroxine (SYNTHROID) 25 MCG tablet Take 25 mcg by mouth daily before breakfast.    [provider]  LORazepam (ATIVAN) 0.5 MG tablet Take 0.5 mg by mouth at bedtime.    [provider]  losartan (COZAAR) 50 MG tablet TAKE ONE (1) TABLET BY MOUTH EVERY DAY 06/07/22   Richardo Priest, MD  Magnesium 200 MG TABS Take 200 mg by mouth daily as needed.    [provider]  Multiple Vitamins-Minerals (CENTRUM SILVER PO) Take 1 tablet by mouth daily.    [provider]  Multiple Vitamins-Minerals (PRESERVISION AREDS 2) CAPS Take 1 capsule by mouth 2 (two) times daily.    [provider]  ondansetron (ZOFRAN) 4 MG tablet Take 1 tablet (4 mg total) by mouth every 6 (six) hours as needed for nausea. 04/29/19   Gaynelle Arabian, MD  sertraline (ZOLOFT) 50 MG tablet Take 50 mg by mouth at bedtime.    [provider]  simvastatin (ZOCOR) 20 MG tablet Take 20 mg by mouth daily.    [provider]      Allergies  Hiprex [methenamine], Morphine, Penicillins, Sulfa antibiotics, and Sulfamethoxazole-trimethoprim    Review of Systems   Review of Systems  Physical Exam Updated Vital Signs BP (!) 183/79   Pulse 73   Temp 98 F (36.7 C) (Oral)   Resp 15   Ht 5' 3"$  (1.6 m)   Wt 74 kg   SpO2 100%   BMI 28.90 kg/m  Physical Exam Vitals and nursing note reviewed.  Constitutional:      General: She is not in acute distress.    Appearance: She is well-developed. She is not diaphoretic.  HENT:     Head: Normocephalic and atraumatic.  Eyes:     Pupils: Pupils are equal, round, and reactive to light.  Cardiovascular:     Rate and Rhythm: Normal rate and regular rhythm.     Heart sounds: No murmur heard.     No friction rub. No gallop.  Pulmonary:     Effort: Pulmonary effort is normal.     Breath sounds: No wheezing or rales.  Abdominal:     General: There is no distension.     Palpations: Abdomen is soft.     Tenderness: There is no abdominal tenderness.  Musculoskeletal:        General: No tenderness.     Cervical back: Normal range of motion and neck supple.  Skin:    General: Skin is warm and dry.  Neurological:     Mental Status: She is alert and oriented to person, place, and time.     Cranial Nerves: Cranial nerves 2-12 are intact.     Sensory: Sensation is intact.     Motor: Motor function is intact.     Coordination: Coordination is intact.  Psychiatric:        Behavior: Behavior normal.     ED Results / Procedures / Treatments   Labs (all labs ordered are listed, but only abnormal results are displayed) Labs Reviewed  COMPREHENSIVE METABOLIC PANEL - Abnormal; Notable for the following components:      Result Value   Glucose, Bld 152 (*)    All other components within normal limits  CBC WITH DIFFERENTIAL/PLATELET  HEPARIN LEVEL (UNFRACTIONATED)  CBC    EKG EKG Interpretation  Date/Time:  Sunday July 21 2022 20:09:23 EST Ventricular Rate:  74 PR Interval:  205 QRS Duration: 95 QT Interval:  389 QTC Calculation: 432 R Axis:   -6 Text Interpretation: Sinus rhythm No old tracing to compare Confirmed by Deno Etienne [phone]) on 07/21/2022 8:24:28 PM  Radiology CT ANGIO HEAD NECK W WO CM  Result Date: 07/21/2022 CLINICAL DATA:  Hypertension, blurred vision, weakness EXAM: CT ANGIOGRAPHY HEAD AND NECK TECHNIQUE: Multidetector CT imaging of the head and neck was performed using the standard protocol during bolus administration of intravenous contrast. Multiplanar CT image reconstructions and MIPs were obtained to evaluate the vascular anatomy. Carotid stenosis measurements (when applicable) are obtained utilizing NASCET criteria, using the distal internal carotid  diameter as the denominator. RADIATION DOSE REDUCTION: This exam was performed according to the departmental dose-optimization program which includes automated exposure control, adjustment of the mA and/or kV according to patient size and/or use of iterative reconstruction technique. CONTRAST:  45m OMNIPAQUE IOHEXOL 350 MG/ML SOLN COMPARISON:  No prior CTA available, correlation is made with CT head 03/22/2006 FINDINGS: CT HEAD FINDINGS Brain: No evidence of acute infarct, hemorrhage, mass, mass effect, or midline shift. No hydrocephalus or extra-axial fluid collection. Normal cerebral volume for age. Vascular: No hyperdense vessel. Skull:  Negative for fracture or focal lesion. Sinuses/Orbits: No acute finding. Status post bilateral lens replacements. Other: The mastoid air cells are well aerated. CTA NECK FINDINGS Aortic arch: Standard branching. Imaged portion shows no evidence of aneurysm or dissection. No significant stenosis of the major arch vessel origins. Aortic atherosclerosis. Right carotid system: No evidence of dissection, occlusion, or hemodynamically significant stenosis (greater than 50%). Left carotid system: No evidence of dissection, occlusion, or hemodynamically significant stenosis (greater than 50%). Vertebral arteries: The right vertebral artery is patent from its origin to the skull base, without significant stenosis, dissection, or occlusion. The left vertebral artery is patent at its origin and in the left V1 segment. Diminishing opacification of the left V2 segment (series 10, image 238), and severe stenosis in the proximal V3 segment (series 10, image 178), with improved opacification and caliber in the distal left V3 segment, likely via collaterals. No evidence of dissection the left vertebral artery. Skeleton: No acute osseous abnormality. Degenerative changes in the cervical spine. Other neck: Hypoenhancing lesions. No otherwise negative. In the right thyroid lobe, which measure up to  1.5 cm Upper chest: No focal pulmonary opacity or pleural effusion. Review of the MIP images confirms the above findings CTA HEAD FINDINGS Anterior circulation: Both internal carotid arteries are patent to the termini, without significant stenosis. A1 segments patent. Normal anterior communicating artery. Anterior cerebral arteries are patent to their distal aspects. No M1 stenosis or occlusion. MCA branches perfused and symmetric. Posterior circulation: Vertebral arteries patent to the vertebrobasilar junction without stenosis. Basilar patent to its distal aspect. Superior cerebellar arteries patent proximally. Patent P1 segments. PCAs perfused to their distal aspects without stenosis. The right posterior communicating artery is patent. Venous sinuses: As permitted by contrast timing, patent. Anatomic variants: None significant. Review of the MIP images confirms the above findings IMPRESSION: 1. Diminishing opacification of the left V2 segment along its course, concerning for thrombosis, favored to be acute. Reconstitution in the left V3 segment, likely due to collaterals. 2. No acute intracranial process. 3. No intracranial large vessel occlusion or significant stenosis. 4. No hemodynamically significant stenosis in the neck. 5. Hypoenhancing lesions in the right thyroid lobe, which measure up to 1.5 cm. If this has not previously been evaluated, a non-emergent ultrasound of the thyroid is recommended. (Reference: J Am Coll Radiol. 2015 Feb;12(2): 143-50) 6. Aortic atherosclerosis. Aortic Atherosclerosis (ICD10-I70.0). These results were called by telephone at the time of interpretation on 07/21/2022 at 10:03 pm to provider Beola Vasallo , who verbally acknowledged these results. Electronically Signed   By: Merilyn Baba M.D.   On: 07/21/2022 22:08    Procedures Procedures    Medications Ordered in ED Medications  heparin ADULT infusion 100 units/mL (25000 units/270m) (850 Units/hr Intravenous New Bag/Given  07/21/22 2312)  sodium chloride 0.9 % bolus 1,000 mL (0 mLs Intravenous Stopped 07/21/22 2210)  iohexol (OMNIPAQUE) 350 MG/ML injection 100 mL (75 mLs Intravenous Contrast Given 07/21/22 2124)  aspirin chewable tablet 324 mg (324 mg Oral Given 07/21/22 2239)    ED Course/ Medical Decision Making/ A&P                             Medical Decision Making Amount and/or Complexity of Data Reviewed Labs: ordered. Radiology: ordered.  Risk OTC drugs. Prescription drug management. Decision regarding hospitalization.   87yo F with a chief complaints of loss of vision.  This lasted for about 30 seconds.  Sounds more  vasovagal than anything else by history.  Patient has severe left-sided macular degeneration and so she commented that she thought she did lose vision on that side as well but is unsure.  She has a benign neurologic exam now.  Will obtain a laboratory evaluation.  CT imaging of the head.  With her having neck pain we will do a CT angiogram to assess for dissection.3  I discussed the case with the radiologist, there is some concern for a V2 occlusion.  Thought to be acute.  I discussed this with neurology Dr. Lorrin Goodell, based on my description of the history and the imaging findings he recommended starting her on heparin giving a dose of aspirin recommended admission at [facility].  The patients results and plan were reviewed and discussed.   Any x-rays performed were independently reviewed by myself.   Differential diagnosis were considered with the presenting HPI.  Medications  heparin ADULT infusion 100 units/mL (25000 units/214m) (850 Units/hr Intravenous New Bag/Given 07/21/22 2312)  sodium chloride 0.9 % bolus 1,000 mL (0 mLs Intravenous Stopped 07/21/22 2210)  iohexol (OMNIPAQUE) 350 MG/ML injection 100 mL (75 mLs Intravenous Contrast Given 07/21/22 2124)  aspirin chewable tablet 324 mg (324 mg Oral Given 07/21/22 2239)    Vitals:   07/21/22 2115 07/21/22 2145 07/21/22 2200  07/21/22 2230  BP: (!) 179/76 (!) 152/105 (!) 183/79   Pulse: 65 (!) 104 74 73  Resp: 15 (!) 22 16 15  $ Temp:      TempSrc:      SpO2: 95% 98% 97% 100%  Weight:    74 kg  Height:    5' 3"$  (1.6 m)    Final diagnoses:  TIA (transient ischemic attack)    Admission/ observation were discussed with the admitting physician, patient and/or family and they are comfortable with the plan.          Final Clinical Impression(s) / ED Diagnoses Final diagnoses:  TIA (transient ischemic attack)    Rx / DC Orders ED Discharge Orders     None         FDeno Etienne DO 07/21/22 2321

## 2022-07-21 NOTE — Plan of Care (Signed)
[facility] will assume care on arrival to accepting facility. Until arrival, care as per EDP. However, [facility] available 24/7 for questions and assistance.  Nursing staff, please page [facility] Admits and Consults ([phone]) as soon as the patient arrives to the hospital.   

## 2022-07-21 NOTE — Progress Notes (Signed)
ANTICOAGULATION CONSULT NOTE - Initial Consult  Pharmacy Consult for heparin Indication:  vertebral thrombus V2  Allergies  Allergen Reactions   Hiprex [Methenamine] Rash    Other reaction(s): Fever Chills   Morphine Swelling    Other reaction(s): Other   Penicillins Rash    Did it involve swelling of the face/tongue/throat, SOB, or low BP? No Did it involve sudden or severe rash/hives, skin peeling, or any reaction on the inside of your mouth or nose? Yes Did you need to seek medical attention at a hospital or doctor's office? Yes When did it last happen?      4 years If all above answers are "NO", may proceed with cephalosporin use.    Sulfa Antibiotics Rash   Sulfamethoxazole-Trimethoprim Rash    Nausea    Patient Measurements:   Heparin Dosing Weight: 68.1 Kg  Vital Signs: Temp: 98 F (36.7 C) (02/18 2112) Temp Source: Oral (02/18 2112) BP: 183/79 (02/18 2200) Pulse Rate: 73 (02/18 2230)  Labs: Recent Labs    07/21/22 2050  HGB 14.4  HCT 44.0  PLT 221  CREATININE 0.61    CrCl cannot be calculated (Unknown ideal weight.).   Medical History: Past Medical History:  Diagnosis Date   Anxiety    Arthritis    Headache    migraines but none since 15 years   Hypertension    Hypothyroidism    Macular degeneration of left eye    injections every month    Assessment: 87 yo F foud to have a vertebral thrombus (V2). No anticoagulation reported PTA. CBC WNL. Pharmacy consulted to dose heparin infusion. Will [place] conservative dosing.   Goal of Therapy:  Heparin level 0.3-0.5 units/ml Monitor platelets by anticoagulation protocol: Yes   Plan:  Start heparin infusion at 850 units/hr Check anti-Xa level in 8 hours and daily while on heparin Continue to monitor H&H and platelets  Georga Bora, PharmD Clinical Pharmacist 07/21/2022 10:56 PM Please check AMION for all [facility] numbers

## 2022-07-22 ENCOUNTER — Other Ambulatory Visit: Payer: Self-pay

## 2022-07-22 ENCOUNTER — Observation Stay (HOSPITAL_COMMUNITY): Payer: Medicare Other

## 2022-07-22 DIAGNOSIS — G459 Transient cerebral ischemic attack, unspecified: Secondary | ICD-10-CM

## 2022-07-22 DIAGNOSIS — I6782 Cerebral ischemia: Secondary | ICD-10-CM | POA: Diagnosis not present

## 2022-07-22 DIAGNOSIS — R29818 Other symptoms and signs involving the nervous system: Secondary | ICD-10-CM | POA: Diagnosis not present

## 2022-07-22 DIAGNOSIS — I6502 Occlusion and stenosis of left vertebral artery: Secondary | ICD-10-CM

## 2022-07-22 LAB — HEPARIN LEVEL (UNFRACTIONATED)
Heparin Unfractionated: 0.45 IU/mL (ref 0.30–0.70)
Heparin Unfractionated: 0.28 IU/mL — ABNORMAL LOW (ref 0.30–0.70)

## 2022-07-22 LAB — CBC
HCT: 41.4 % (ref 36.0–46.0)
Hemoglobin: 13.7 g/dL (ref 12.0–15.0)
MCH: 30.6 pg (ref 26.0–34.0)
MCHC: 33.1 g/dL (ref 30.0–36.0)
MCV: 92.4 fL (ref 80.0–100.0)
Platelets: 200 10*3/uL (ref 150–400)
RBC: 4.48 MIL/uL (ref 3.87–5.11)
RDW: 14 % (ref 11.5–15.5)
WBC: 6 10*3/uL (ref 4.0–10.5)
nRBC: 0 % (ref 0.0–0.2)

## 2022-07-22 LAB — HEMOGLOBIN A1C
Hgb A1c MFr Bld: 5.3 % (ref 4.8–5.6)
Mean Plasma Glucose: 105.41 mg/dL

## 2022-07-22 LAB — TSH: TSH: 2.208 u[IU]/mL (ref 0.350–4.500)

## 2022-07-22 MED ORDER — STROKE: EARLY STAGES OF RECOVERY BOOK
Freq: Once | Status: AC
  Filled 2022-07-22: qty 1

## 2022-07-22 MED ORDER — ONDANSETRON HCL 4 MG/2ML IJ SOLN: 4.0000 mg | Freq: Four times a day (QID) | INTRAMUSCULAR | Status: DC | PRN

## 2022-07-22 MED ORDER — ACETAMINOPHEN 325 MG PO TABS
650.0000 mg | ORAL_TABLET | Freq: Four times a day (QID) | ORAL | Status: DC | PRN
  Administered 2022-07-22 – 2022-07-23 (×3): 650 mg via ORAL
  Filled 2022-07-22 (×3): qty 2

## 2022-07-22 MED ORDER — LEVOTHYROXINE SODIUM 25 MCG PO TABS
25.0000 ug | ORAL_TABLET | Freq: Every day | ORAL | Status: DC
  Administered 2022-07-22 – 2022-07-23 (×2): 25 ug via ORAL
  Filled 2022-07-22 (×2): qty 1

## 2022-07-22 MED ORDER — DIAZEPAM 5 MG/ML IJ SOLN
2.0000 mg | Freq: Once | INTRAMUSCULAR | Status: AC | PRN
  Administered 2022-07-22: 2 mg via INTRAVENOUS
  Filled 2022-07-22: qty 2

## 2022-07-22 MED ORDER — CEPHALEXIN 250 MG PO CAPS
250.0000 mg | ORAL_CAPSULE | Freq: Every day | ORAL | Status: DC
  Administered 2022-07-22 (×2): 250 mg via ORAL
  Filled 2022-07-22 (×2): qty 1

## 2022-07-22 MED ORDER — PANTOPRAZOLE SODIUM 40 MG PO TBEC
40.0000 mg | DELAYED_RELEASE_TABLET | Freq: Every day | ORAL | Status: DC
  Administered 2022-07-22 – 2022-07-23 (×2): 40 mg via ORAL
  Filled 2022-07-22 (×2): qty 1

## 2022-07-22 MED ORDER — LORAZEPAM 0.5 MG PO TABS
0.5000 mg | ORAL_TABLET | Freq: Every day | ORAL | Status: DC
  Administered 2022-07-22: 0.5 mg via ORAL
  Filled 2022-07-22: qty 1

## 2022-07-22 MED ORDER — ONDANSETRON HCL 4 MG PO TABS: 4.0000 mg | ORAL_TABLET | Freq: Four times a day (QID) | ORAL | Status: DC | PRN

## 2022-07-22 MED ORDER — ACETAMINOPHEN 325 MG PO TABS
325.0000 mg | ORAL_TABLET | Freq: Once | ORAL | Status: AC
  Administered 2022-07-22: 325 mg via ORAL
  Filled 2022-07-22: qty 1

## 2022-07-22 MED ORDER — HYPROMELLOSE (GONIOSCOPIC) 2.5 % OP SOLN: 1.0000 [drp] | OPHTHALMIC | Status: DC | PRN

## 2022-07-22 MED ORDER — SIMVASTATIN 20 MG PO TABS
20.0000 mg | ORAL_TABLET | Freq: Every day | ORAL | Status: DC
  Filled 2022-07-22: qty 1

## 2022-07-22 NOTE — Progress Notes (Signed)
[facility] for heparin Indication:  vertebral thrombus V2  Allergies  Allergen Reactions   Hiprex [Methenamine] Rash    Other reaction(s): Fever Chills   Morphine Swelling    Other reaction(s): Other   Penicillins Rash    Did it involve swelling of the face/tongue/throat, SOB, or low BP? No Did it involve sudden or severe rash/hives, skin peeling, or any reaction on the inside of your mouth or nose? Yes Did you need to seek medical attention at a hospital or doctor's office? Yes When did it last happen?      4 years If all above answers are "NO", may proceed with cephalosporin use.    Sulfa Antibiotics Rash   Sulfamethoxazole-Trimethoprim Rash    Nausea    Patient Measurements: Height: 5' 3"$  (160 cm) Weight: 74 kg (163 lb 2.3 oz) IBW/kg (Calculated) : 52.4 Heparin Dosing Weight: 68.1 Kg  Vital Signs: Temp: 98 F (36.7 C) (02/18 2112) Temp Source: Oral (02/18 2112) BP: 158/123 (02/19 0800) Pulse Rate: 72 (02/19 0745)  Labs: Recent Labs    07/21/22 2050 07/22/22 0748  HGB 14.4 13.7  HCT 44.0 41.4  PLT 221 200  HEPARINUNFRC  --  0.45  CREATININE 0.61  --      Estimated Creatinine Clearance: 50.4 mL/min (by C-G formula based on SCr of 0.61 mg/dL).  Assessment: 87 yo F foud to have a vertebral thrombus (V2). No anticoagulation reported PTA. CBC WNL. Pharmacy consulted to dose heparin infusion.  Initial heparin level is therapeutic at 0.45. No bleeding noted.   Goal of Therapy:  Heparin level 0.3-0.5 units/ml Monitor platelets by anticoagulation protocol: Yes   Plan:  Continue heparin infusion at 850 units/hr Recheck heparin level this afternoon to confirm dosing Check anti-Xa level in 8 hours and daily while on heparin  Salome Arnt, PharmD, BCPS, BCEMP Clinical Pharmacist Please see AMION for all pharmacy numbers 07/22/2022 8:45 AM

## 2022-07-22 NOTE — Progress Notes (Signed)
Speech Language Pathology  Patient Details Name: Melissa Love MRN: [id number] DOB: 12/29/1935 Today's Date: 07/22/2022 Time:  -     Pt seen and screened for speech-language-cognitive needs. Her had no concerns with speech, language or cognition during time of event. Pharmacy called pt on the phone and she was able to have a 5 min conversation recalling all her meds, what she takes and the last time she took them. Family present and feel, as pt does that she is at baseline. No formal assessment needed.                         [name]  07/22/2022, 2:03 PM

## 2022-07-22 NOTE — Evaluation (Signed)
Occupational Therapy Evaluation Patient Details Name: Melissa Love MRN: [id number] DOB: 10/09/1935 Today's Date: 07/22/2022   History of Present Illness 87 y.o. female with paroxysmal atrial tachycardia but no afibb, HTN, hypothyroidism, macular degeneration of left eye who had a 30 sec episode of complete vision loss bilaterally while cooking.   Clinical Impression   Patient admitted for the diagnosis above.  PTA she lives alone, drives locally, cares for her dog, and needed no assist with ADL, iADL or mobility.  She does use a cane on occasion.  She is very close to baseline, with her only complaint of a headache.  MRI is pending.  Patient is needing generalized supervision, but no physical assist.  Patient stating her vision is back to baseline.  OT will follow in the acute setting to ensure a safe transition home.  No post acute OT is anticipated.        Recommendations for follow up therapy are one component of a multi-disciplinary discharge planning process, led by the attending physician.  Recommendations may be updated based on patient status, additional functional criteria and insurance authorization.   Follow Up Recommendations  No OT follow up     Assistance Recommended at Discharge PRN  Patient can return home with the following Assist for transportation    Functional Status Assessment  Patient has had a recent decline in their functional status and demonstrates the ability to make significant improvements in function in a reasonable and predictable amount of time.  Equipment Recommendations  None recommended by OT    Recommendations for Other Services       Precautions / Restrictions Precautions Precautions: Fall Restrictions Weight Bearing Restrictions: No      Mobility Bed Mobility Overal bed mobility: Modified Independent                  Transfers Overall transfer level: Needs assistance Equipment used: None Transfers: Sit to/from Stand, Bed to  chair/wheelchair/BSC Sit to Stand: Supervision     Step pivot transfers: Supervision            Balance Overall balance assessment: Mild deficits observed, not formally tested                                         ADL either performed or assessed with clinical judgement   ADL Overall ADL's : At baseline                                       General ADL Comments: generalized supervision for lines     Vision Patient Visual Report: No change from baseline       Perception     Praxis      Pertinent Vitals/Pain Pain Assessment Pain Assessment: Faces Faces Pain Scale: Hurts little more Pain Location: headache Pain Descriptors / Indicators: Aching Pain Intervention(s): Monitored during session     Hand Dominance Right   Extremity/Trunk Assessment Upper Extremity Assessment Upper Extremity Assessment: Overall WFL for tasks assessed   Lower Extremity Assessment Lower Extremity Assessment: Defer to PT evaluation   Cervical / Trunk Assessment Cervical / Trunk Assessment: Normal   Communication Communication Communication: No difficulties   Cognition Arousal/Alertness: Awake/alert Behavior During Therapy: WFL for tasks assessed/performed Overall Cognitive Status: Within Functional Limits for tasks assessed  General Comments   vss    Exercises     Shoulder Instructions      Home Living Family/patient expects to be discharged to:: Private residence Living Arrangements: Alone Available Help at Discharge: Family;Available PRN/intermittently Type of Home: House Home Access: Stairs to enter [employer] of Steps: 1   Home Layout: One level     Bathroom Shower/Tub: Teacher, early years/pre: Standard Bathroom Accessibility: Yes How Accessible: Accessible via walker Home Equipment: Cane - single point;Adaptive equipment;Grab bars -  tub/shower Adaptive Equipment: Reacher        Prior Functioning/Environment Prior Level of Function : Independent/Modified Independent;Driving                        OT Problem List: Impaired balance (sitting and/or standing)      OT Treatment/Interventions: Self-care/ADL training;Therapeutic activities    OT Goals(Current goals can be found in the care plan section) Acute Rehab OT Goals Patient Stated Goal: Home tomorrow OT Goal Formulation: With patient Time For Goal Achievement: 08/05/22 Potential to Achieve Goals: Good ADL Goals Pt Will Perform Grooming: with modified independence;standing Pt Will Perform Lower Body Dressing: with modified independence;sit to/from stand Pt Will Transfer to Toilet: with modified independence;ambulating;regular height toilet  OT Frequency: Min 2X/week    Co-evaluation              AM-PAC OT "6 Clicks" Daily Activity     Outcome Measure Help from another person eating meals?: None Help from another person taking care of personal grooming?: None Help from another person toileting, which includes using toliet, bedpan, or urinal?: A Little Help from another person bathing (including washing, rinsing, drying)?: A Little Help from another person to put on and taking off regular upper body clothing?: None Help from another person to put on and taking off regular lower body clothing?: A Little 6 Click Score: 21   End of Session Nurse Communication: Mobility status  Activity Tolerance: Patient tolerated treatment well Patient left: in chair;with call bell/phone within reach;with chair alarm set  OT Visit Diagnosis: Low vision, both eyes (H54.2)                Time: 1250-1310 OT Time Calculation (min): 20 min Charges:  OT General Charges $OT Visit: 1 Visit OT Evaluation $OT Eval Moderate Complexity: 1 Mod  07/22/2022  RP, OTR/L  Acute Rehabilitation Services  Office:  [phone]   Metta Clines 07/22/2022, 1:25  PM

## 2022-07-22 NOTE — ED Notes (Signed)
Report to [facility].  

## 2022-07-22 NOTE — Plan of Care (Signed)
  Problem: Education: Goal: Knowledge of General Education information will improve Description: Including pain rating scale, medication(s)/side effects and non-pharmacologic comfort measures Outcome: Progressing   Problem: Health Behavior/Discharge Planning: Goal: Ability to manage health-related needs will improve Outcome: Progressing   Problem: Clinical Measurements: Goal: Ability to maintain clinical measurements within normal limits will improve Outcome: Progressing Goal: Will remain free from infection Outcome: Progressing Goal: Diagnostic test results will improve Outcome: Progressing Goal: Respiratory complications will improve Outcome: Progressing Goal: Cardiovascular complication will be avoided Outcome: Progressing   Problem: Activity: Goal: Risk for activity intolerance will decrease Outcome: Progressing   Problem: Nutrition: Goal: Adequate nutrition will be maintained Outcome: Progressing   Problem: Coping: Goal: Level of anxiety will decrease Outcome: Progressing   Problem: Elimination: Goal: Will not experience complications related to bowel motility Outcome: Progressing Goal: Will not experience complications related to urinary retention Outcome: Progressing   Problem: Pain Managment: Goal: General experience of comfort will improve Outcome: Progressing   Problem: Safety: Goal: Ability to remain free from injury will improve Outcome: Progressing   Problem: Skin Integrity: Goal: Risk for impaired skin integrity will decrease Outcome: Progressing   Problem: Education: Goal: Knowledge of disease or condition will improve Outcome: Progressing Goal: Knowledge of secondary prevention will improve (MUST DOCUMENT ALL) Outcome: Progressing Goal: Knowledge of patient specific risk factors will improve Elta [place] N/A or DELETE if not current risk factor) Outcome: Progressing   Problem: Ischemic Stroke/TIA Tissue Perfusion: Goal: Complications of ischemic  stroke/TIA will be minimized Outcome: Progressing   Problem: Coping: Goal: Will verbalize positive feelings about self Outcome: Progressing Goal: Will identify appropriate support needs Outcome: Progressing   Problem: Health Behavior/Discharge Planning: Goal: Ability to manage health-related needs will improve Outcome: Progressing Goal: Goals will be collaboratively established with patient/family Outcome: Progressing   Problem: Self-Care: Goal: Ability to participate in self-care as condition permits will improve Outcome: Progressing Goal: Verbalization of feelings and concerns over difficulty with self-care will improve Outcome: Progressing Goal: Ability to communicate needs accurately will improve Outcome: Progressing   Problem: Nutrition: Goal: Risk of aspiration will decrease Outcome: Progressing Goal: Dietary intake will improve Outcome: Progressing

## 2022-07-22 NOTE — Consult Note (Addendum)
Neurology Consultation  Reason for Consult: TIA workup Referring Physician: Dr. Olevia Bowens  CC: complete loss of vision  History is obtained from: Melissa Love  HPI: Melissa Love is a 87 y.o. female with paroxysmal atrial tachycardia but no afibb, HTN, hypothyroidism, macular degeneration of left eye who had a 30 sec episode of complete vision loss bilaterally while cooking. She reports that yesterday around 5:30 pm, she was walking the dog and afterwards was getting food from her fridge when she noticed sudden complete loss of vision in both of her eyes. She notes the episode lasting for about 20 second and her vision returned. She denies other acute symptoms during that time. She denies having these symptoms in the past.  She took her evening blood pressure medications earlier that usual due to the episode. She took her BP after taking the medications and when she saw the BP was even more elevated, she went to the ED. She went to [facility] where a CT head and CTA was completed. CTA H + N demonstrated concern for left V1 acute thrombosis.   For the past few months, Melissa Love reports having "head tenderness. She denies a history of falls. She states that sometimes when she bends over to see her dog, she hits her head on the counter. She has had a shoulder and hip replacement in the past. Denies recent fever. She reports having chills when she has a headache. She reports having chronic UTI and urinary incontinence, having to wear a heavy pad daily. She reports occasional SOB and heart palpitations that awakens her from sleep. Denies N/V.   TNK given?: no  IR Thrombectomy? No Modified Rankin Scale: 0-Completely asymptomatic and back to baseline post- stroke  ROS: A complete ROS was performed and is negative except as noted in the HPI.  Past Medical History:  Diagnosis Date   Anxiety    Arthritis    Headache    migraines but none since 15 years   Hypertension    Hypothyroidism    Macular  degeneration of left eye    injections every month     Family History  Problem Relation Age of Onset   Pneumonia Mother    Congestive Heart Failure Father    Hypertension Sister    Hypertension Brother      Social History:   reports that she quit smoking about 34 years ago. Her smoking use included cigarettes. She has a 5.00 pack-year smoking history. She has never been exposed to tobacco smoke. She has never used smokeless tobacco. She reports that she does not currently use alcohol. She reports that she does not use drugs.  Medications  Current Facility-Administered Medications:    [START ON 07/23/2022]  stroke: early stages of recovery book, , Does not apply, Once, Reubin Milan, MD   acetaminophen (TYLENOL) tablet 650 mg, 650 mg, Oral, Q6H PRN, Reubin Milan, MD, 650 mg at 07/22/22 0908   ALPRAZolam Duanne Moron) tablet 0.5 mg, 0.5 mg, Oral, BID PRN, Reubin Milan, MD, 0.5 mg at 07/22/22 0019   cephALEXin (KEFLEX) capsule 250 mg, 250 mg, Oral, QHS, Reubin Milan, MD, 250 mg at 07/22/22 0019   heparin ADULT infusion 100 units/mL (25000 units/219m), 850 Units/hr, Intravenous, Continuous, OReubin Milan MD, Last Rate: 8.5 mL/hr at 07/21/22 2312, 850 Units/hr at 07/21/22 2312   ondansetron (ZOFRAN) tablet 4 mg, 4 mg, Oral, Q6H PRN **OR** ondansetron (ZOFRAN) injection 4 mg, 4 mg, Intravenous, Q6H PRN, OReubin Milan MD  sertraline (ZOLOFT) tablet 50 mg, 50 mg, Oral, QHS, Reubin Milan, MD, 50 mg at 07/22/22 0011   Exam: Current vital signs: BP (!) 166/86 (BP Location: Right Arm)   Pulse 66   Temp 97.8 F (36.6 C) (Oral)   Resp 19   Ht 5' 3"$  (1.6 m)   Wt 74 kg   SpO2 100%   BMI 28.90 kg/m  Vital signs in last 24 hours: Temp:  [97.8 F (36.6 C)-98.3 F (36.8 C)] 97.8 F (36.6 C) (02/19 1120) Pulse Rate:  [63-104] 66 (02/19 1120) Resp:  [14-24] 19 (02/19 1120) BP: (131-213)/(60-131) 166/86 (02/19 1120) SpO2:  [90 %-100 %] 100 % (02/19  1120) Weight:  [74 kg] 74 kg (02/18 2230)  GENERAL: Awake, alert, in no acute distress Psych: Affect appropriate for situation, Melissa Love is calm and cooperative with examination Head: Normocephalic and atraumatic, without obvious abnormality EENT: Normal conjunctivae, dry mucous membranes, no OP obstruction LUNGS: Normal respiratory effort. Non-labored breathing on room air CV: Regular rate and rhythm on telemetry ABDOMEN: Soft, non-tender, non-distended Extremities: warm, well perfused, without obvious deformity  NEURO:  Mental Status: Awake, alert, and oriented to person, place, time, and situation. He/She is able to provide a clear and coherent history of present illness. Speech/Language: speech is normal   Naming, repetition, fluency, and comprehension intact without aphasia  No neglect is noted Cranial Nerves:  II: PERRL. visual fields full.  III, IV, VI: EOMI. Lid elevation symmetric and full.  V: Sensation is intact to light touch and symmetrical to face. Blinks to threat. Moves jaw back and forth.  VII: Face is symmetric resting and smiling. Able to puff cheeks and raise eyebrows.  VIII: Hearing intact to voice IX, X: Palate elevation is symmetric. Phonation normal.  XI: Normal sternocleidomastoid and trapezius muscle strength XII: Tongue protrudes midline without fasciculations.   Motor: 5/5 strength is all muscle groups.  Tone is normal. Bulk is normal.  Sensation: Intact to light touch bilaterally in all four extremities. No extinction to DSS present.  Coordination: FTN intact bilaterally. HKS intact bilaterally. No pronator drift. Alternating hand movements.  DTRs: 2+ throughout.  Gait: Deferred  NIHSS: 1a Level of Conscious.: 0 1b LOC Questions: 0 1c LOC Commands: 0 2 Best Gaze: 0 3 Visual: 0 4 Facial Palsy: 0 5a Motor Arm - left: 0 5b Motor Arm - Right: 0 6a Motor Leg - Left: 0 6b Motor Leg - Right: 0 7 Limb Ataxia: 0 8 Sensory: 0 9 Best Language: 0 10  Dysarthria: 0 11 Extinct. and Inatten.: 0 TOTAL: 0   Labs I have reviewed labs in epic and the results pertinent to this consultation are:   CBC    Component Value Date/Time   WBC 6.0 07/22/2022 0748   RBC 4.48 07/22/2022 0748   HGB 13.7 07/22/2022 0748   HCT 41.4 07/22/2022 0748   PLT 200 07/22/2022 0748   MCV 92.4 07/22/2022 0748   MCH 30.6 07/22/2022 0748   MCHC 33.1 07/22/2022 0748   RDW 14.0 07/22/2022 0748   LYMPHSABS 2.1 07/21/2022 2050   MONOABS 0.3 07/21/2022 2050   EOSABS 0.1 07/21/2022 2050   BASOSABS 0.1 07/21/2022 2050    CMP     Component Value Date/Time   NA 143 07/21/2022 2050   K 3.7 07/21/2022 2050   CL 105 07/21/2022 2050   CO2 28 07/21/2022 2050   GLUCOSE 152 (H) 07/21/2022 2050   BUN 17 07/21/2022 2050   CREATININE 0.61 07/21/2022 2050  CALCIUM 10.0 07/21/2022 2050   PROT 7.0 07/21/2022 2050   ALBUMIN 4.7 07/21/2022 2050   AST 18 07/21/2022 2050   ALT 23 07/21/2022 2050   ALKPHOS 90 07/21/2022 2050   BILITOT 0.3 07/21/2022 2050   GFRNONAA >60 07/21/2022 2050   GFRAA >60 04/29/2019 0249    Lipid Panel pending   Imaging I have reviewed the images obtained:  CTA head and neck personally reviewed, agree with radiology:   1. Diminishing opacification of the left V2 segment along its course, concerning for thrombosis, favored to be acute. Reconstitution in the left V3 segment, likely due to collaterals. 2. No acute intracranial process. 3. No intracranial large vessel occlusion or significant stenosis. 4. No hemodynamically significant stenosis in the neck. 5. Hypoenhancing lesions in the right thyroid lobe, which measure up to 1.5 cm. If this has not previously been evaluated, a non-emergent ultrasound of the thyroid is recommended. (Reference: J Am Coll Radiol. 2015 Feb;12(2): 143-50) 6. Aortic atherosclerosis.  MRI examination of the brain personally reviewed, agree with radiology:   No evidence of acute intracranial abnormality.    Assessment: TYAUNA EDMUNDS is a 87 y.o. female with paroxysmal atrial tachycardia but no afibb, HTN, hypothyroidism, macular degeneration of left eye who had a 30 sec episode of complete vision loss bilaterally while cooking.  Impression: Melissa Love most likely has a TIA, secondary to acute right vertebral thrombus of currently undetermined source. Will obtain an MRI to assess if Melissa Love had a CVA or if this was a TIA.  On heparin drip to prevent thrombus propagation in posterior circulation, low goal, no bolus heparin drip protocol   Recommendations: Stroke/TIA Workup - Admit for stroke workup - Permissive HTN 24-48 hours to allow cerebral autoregulation - MRI brain wo contrast, personally reviewed, agree with radiology negative  - TTE  - Check A1c and LDL + add statin per guidelines (A1c < 7%, LDL < 70)  - anticoag with heparin drip, transition to antiplatelet per stroke team - q4 hr neuro checks - STAT head CT for any change in neuro exam - Telemetry - PT/OT/SLP not needed as Melissa Love is at her baseline - Stroke education - Thyroid follow-up per primary team, TSH as hyperthyroidism is a risk factor for Afib - Amb referral to neurology upon discharge    Coralyn Pear, MD PGY-1 Psychiatry  Attending Neurologist's note:  I personally saw this Melissa Love, gathering history, performing a full neurologic examination, reviewing relevant labs, personally reviewing relevant imaging including CTA, MRI brain, and formulated the assessment and plan, adding the note above for completeness and clarity to accurately reflect my thoughts  Lesleigh Noe MD-PhD [facility] [phone] Available 7 AM to 7 PM, outside these hours please contact Neurologist on call listed on AMION

## 2022-07-22 NOTE — Plan of Care (Signed)
Brief Neuro phone consult note with Dr. Floyd:(late entry noted)  Briefly, Ms. Melissa Love is a 87 y.o. female with paroxysmal atrial tachycardia but no afibb, HTN, hypothyroidism, macular degeneration of left eye who had a 30 sec episode of complete vision loss BL while cooking. She also reports headache over the last couple of days. She sat down after the episode and noticed that her blood pressure was slightly elevated and so she came to the ED. She also reported some neck pain and this prompted the ED to obtain CT Angio of the head and neck to evaluate for vertebrobasilar insufficiency vs syncope.  CTA H + N demonstrated concern for left V1 acute thrombosis. This does raise suspicion for the episode possibly being vertebrobasilar insufficiency. She currently does not have any symptoms at all.  Recs: - Heparin gtt with goal levels between 0.3-0.5 - Needs MRI Brain to evaluate for strokes in the posterior circulation specially with the noted left V2 thrombus. - neurology consult on arrival to [facility].\   [place] Pager Number [phone]

## 2022-07-22 NOTE — ED Notes (Signed)
Notified lab of need for courier for heparin level due at 0700

## 2022-07-22 NOTE — Evaluation (Signed)
Physical Therapy Evaluation Patient Details Name: Melissa Love MRN: [id number] DOB: Jan 09, 1936 Today's Date: 07/22/2022  History of Present Illness  87 y.o. female presents to [facility] hospital on 07/21/2022 after experiencing ~30 seconds of bilateral vision loss when cooking. CTA demonstrates concern for L V1 acute thrombosis. PMH includes anxiety, OA, migraines, HTN.  Clinical Impression  Pt presents to PT with mild deficits in endurance, pt attributes this to lack of sleep. Pt is able to ambulate without physical assistance and reports to be close to her baseline, with resolution of vision loss. PT encourages frequent mobilization during this admission. Acute PT will continue to follow however no post-acute PT or DME is recommended.       Recommendations for follow up therapy are one component of a multi-disciplinary discharge planning process, led by the attending physician.  Recommendations may be updated based on patient status, additional functional criteria and insurance authorization.  Follow Up Recommendations No PT follow up      Assistance Recommended at Discharge PRN  Patient can return home with the following  Assistance with cooking/housework;Assist for transportation    Equipment Recommendations None recommended by PT  Recommendations for Other Services       Functional Status Assessment Patient has had a recent decline in their functional status and demonstrates the ability to make significant improvements in function in a reasonable and predictable amount of time.     Precautions / Restrictions Precautions Precautions: Fall Precaution Comments: L eye macular degeneration Restrictions Weight Bearing Restrictions: No      Mobility  Bed Mobility                    Transfers Overall transfer level: Independent Equipment used: None                    Ambulation/Gait Ambulation/Gait assistance: Supervision Gait Distance (Feet): 80 Feet Assistive  device: None Gait Pattern/deviations: Step-through pattern Gait velocity: reduced Gait velocity interpretation: <1.8 ft/sec, indicate of risk for recurrent falls   General Gait Details: slowed step-through gait, pt declines ambulation out of room due to fatigue from lack of sleep  Stairs            Wheelchair Mobility    Modified Rankin (Stroke Patients Only) Modified Rankin (Stroke Patients Only) Pre-Morbid Rankin Score: No symptoms Modified Rankin: No significant disability     Balance Overall balance assessment: No apparent balance deficits (not formally assessed)                                           Pertinent Vitals/Pain Pain Assessment Pain Assessment: Faces Faces Pain Scale: Hurts little more Pain Location: headache Pain Descriptors / Indicators: Headache Pain Intervention(s): Monitored during session    Home Living Family/patient expects to be discharged to:: Private residence Living Arrangements: Alone Available Help at Discharge: Family;Available PRN/intermittently Type of Home: House Home Access: Stairs to enter Entrance Stairs-Rails: None Entrance Stairs-Number of Steps: 1   Home Layout: One level Home Equipment: Cane - single point;Adaptive equipment;Grab bars - tub/shower      Prior Function Prior Level of Function : Independent/Modified Independent;Driving                     Hand Dominance   Dominant Hand: Right    Extremity/Trunk Assessment   Upper Extremity Assessment Upper Extremity Assessment: Overall [facility]  for tasks assessed    Lower Extremity Assessment Lower Extremity Assessment: Overall WFL for tasks assessed    Cervical / Trunk Assessment Cervical / Trunk Assessment: Normal  Communication   Communication: No difficulties  Cognition Arousal/Alertness: Awake/alert Behavior During Therapy: WFL for tasks assessed/performed Overall Cognitive Status: Within Functional Limits for tasks assessed                                           General Comments General comments (skin integrity, edema, etc.): VSS on RA    Exercises     Assessment/Plan    PT Assessment Patient needs continued PT services  PT Problem List Decreased activity tolerance       PT Treatment Interventions Gait training;Balance training;Neuromuscular re-education;Patient/family education    PT Goals (Current goals can be found in the Care Plan section)  Acute Rehab PT Goals Patient Stated Goal: to return home PT Goal Formulation: With patient Time For Goal Achievement: 08/05/22 Potential to Achieve Goals: Good Additional Goals Additional Goal #1: Pt will score >19/24 on the DGI to indicate a reduced risk for falls    Frequency Min 2X/week     Co-evaluation               AM-PAC PT "6 Clicks" Mobility  Outcome Measure Help needed turning from your back to your side while in a flat bed without using bedrails?: None Help needed moving from lying on your back to sitting on the side of a flat bed without using bedrails?: None Help needed moving to and from a bed to a chair (including a wheelchair)?: None Help needed standing up from a chair using your arms (e.g., wheelchair or bedside chair)?: None Help needed to walk in hospital room?: A Little Help needed climbing 3-5 steps with a railing? : A Little 6 Click Score: 22    End of Session   Activity Tolerance: Patient tolerated treatment well Patient left: in chair;with call bell/phone within reach;with chair alarm set;with family/visitor present;with nursing/sitter in room Nurse Communication: Mobility status PT Visit Diagnosis: Other abnormalities of gait and mobility (R26.89)    Time: 1411-1430 PT Time Calculation (min) (ACUTE ONLY): 19 min   Charges:   PT Evaluation $PT Eval Low Complexity: [place], PT, DPT Acute Rehabilitation Office [phone]   Zenaida Niece 07/22/2022, 2:40 PM

## 2022-07-22 NOTE — ED Notes (Signed)
Melissa Love with cl called for transport
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
## 2022-07-22 NOTE — Progress Notes (Signed)
[facility] for heparin Indication:  vertebral thrombus V2  Allergies  Allergen Reactions   Hiprex [Methenamine] Rash    Other reaction(s): Fever Chills   Morphine Swelling    Other reaction(s): Other   Penicillins Rash    Did it involve swelling of the face/tongue/throat, SOB, or low BP? No Did it involve sudden or severe rash/hives, skin peeling, or any reaction on the inside of your mouth or nose? Yes Did you need to seek medical attention at a hospital or doctor's office? Yes When did it last happen?      4 years If all above answers are "NO", may proceed with cephalosporin use.    Sulfa Antibiotics Rash   Sulfamethoxazole-Trimethoprim Rash    Nausea    Patient Measurements: Height: 5' 3"$  (160 cm) Weight: 74 kg (163 lb 2.3 oz) IBW/kg (Calculated) : 52.4 Heparin Dosing Weight: 68.1 Kg  Vital Signs: Temp: 97.8 F (36.6 C) (02/19 1120) Temp Source: Oral (02/19 1120) BP: 166/86 (02/19 1120) Pulse Rate: 66 (02/19 1120)  Labs: Recent Labs    07/21/22 2050 07/22/22 0748 07/22/22 1744  HGB 14.4 13.7  --   HCT 44.0 41.4  --   PLT 221 200  --   HEPARINUNFRC  --  0.45 0.28*  CREATININE 0.61  --   --      Estimated Creatinine Clearance: 50.4 mL/min (by C-G formula based on SCr of 0.61 mg/dL).  Assessment: 87 yo F foud to have a vertebral thrombus (V2). No anticoagulation reported PTA. CBC WNL. Pharmacy consulted to dose heparin infusion.  Heparin level now down to slightly subtherapeutic (0.28) on infusion at 850 units/hr. No bleeding noted.  Goal of Therapy:  Heparin level 0.3-0.5 units/ml Monitor platelets by anticoagulation protocol: Yes   Plan:  Increase heparin infusion to 900 units/hr F/u 8 hr heparin level  Sherlon Handing, PharmD, BCPS Please see amion for complete clinical pharmacist phone list 07/22/2022 6:47 PM

## 2022-07-22 NOTE — ED Notes (Signed)
Son's phone # Alvester Chou, [phone]

## 2022-07-23 ENCOUNTER — Other Ambulatory Visit (HOSPITAL_COMMUNITY): Payer: Self-pay

## 2022-07-23 ENCOUNTER — Observation Stay (HOSPITAL_COMMUNITY): Payer: Medicare Other

## 2022-07-23 DIAGNOSIS — H543 Unqualified visual loss, both eyes: Secondary | ICD-10-CM | POA: Diagnosis present

## 2022-07-23 DIAGNOSIS — Z87891 Personal history of nicotine dependence: Secondary | ICD-10-CM | POA: Diagnosis not present

## 2022-07-23 DIAGNOSIS — Z885 Allergy status to narcotic agent status: Secondary | ICD-10-CM | POA: Diagnosis not present

## 2022-07-23 DIAGNOSIS — I10 Essential (primary) hypertension: Secondary | ICD-10-CM | POA: Diagnosis present

## 2022-07-23 DIAGNOSIS — K219 Gastro-esophageal reflux disease without esophagitis: Secondary | ICD-10-CM

## 2022-07-23 DIAGNOSIS — Z880 Allergy status to penicillin: Secondary | ICD-10-CM | POA: Diagnosis not present

## 2022-07-23 DIAGNOSIS — G459 Transient cerebral ischemic attack, unspecified: Secondary | ICD-10-CM | POA: Diagnosis present

## 2022-07-23 DIAGNOSIS — Z882 Allergy status to sulfonamides status: Secondary | ICD-10-CM | POA: Diagnosis not present

## 2022-07-23 DIAGNOSIS — G43109 Migraine with aura, not intractable, without status migrainosus: Secondary | ICD-10-CM | POA: Diagnosis not present

## 2022-07-23 DIAGNOSIS — Z888 Allergy status to other drugs, medicaments and biological substances status: Secondary | ICD-10-CM | POA: Diagnosis not present

## 2022-07-23 DIAGNOSIS — F32A Depression, unspecified: Secondary | ICD-10-CM | POA: Diagnosis present

## 2022-07-23 DIAGNOSIS — Z79890 Hormone replacement therapy (postmenopausal): Secondary | ICD-10-CM | POA: Diagnosis not present

## 2022-07-23 DIAGNOSIS — E039 Hypothyroidism, unspecified: Secondary | ICD-10-CM | POA: Diagnosis present

## 2022-07-23 DIAGNOSIS — R32 Unspecified urinary incontinence: Secondary | ICD-10-CM | POA: Diagnosis present

## 2022-07-23 DIAGNOSIS — Z8249 Family history of ischemic heart disease and other diseases of the circulatory system: Secondary | ICD-10-CM | POA: Diagnosis not present

## 2022-07-23 DIAGNOSIS — Z87440 Personal history of urinary (tract) infections: Secondary | ICD-10-CM

## 2022-07-23 DIAGNOSIS — H3530 Unspecified macular degeneration: Secondary | ICD-10-CM | POA: Diagnosis present

## 2022-07-23 DIAGNOSIS — Z79899 Other long term (current) drug therapy: Secondary | ICD-10-CM | POA: Diagnosis not present

## 2022-07-23 DIAGNOSIS — I471 Supraventricular tachycardia, unspecified: Secondary | ICD-10-CM | POA: Diagnosis present

## 2022-07-23 DIAGNOSIS — I6502 Occlusion and stenosis of left vertebral artery: Secondary | ICD-10-CM | POA: Diagnosis present

## 2022-07-23 DIAGNOSIS — N390 Urinary tract infection, site not specified: Secondary | ICD-10-CM | POA: Diagnosis present

## 2022-07-23 DIAGNOSIS — M542 Cervicalgia: Secondary | ICD-10-CM | POA: Diagnosis present

## 2022-07-23 DIAGNOSIS — E785 Hyperlipidemia, unspecified: Secondary | ICD-10-CM

## 2022-07-23 DIAGNOSIS — I4719 Other supraventricular tachycardia: Secondary | ICD-10-CM | POA: Diagnosis present

## 2022-07-23 DIAGNOSIS — R2681 Unsteadiness on feet: Secondary | ICD-10-CM | POA: Diagnosis present

## 2022-07-23 DIAGNOSIS — F419 Anxiety disorder, unspecified: Secondary | ICD-10-CM | POA: Diagnosis present

## 2022-07-23 DIAGNOSIS — R29700 NIHSS score 0: Secondary | ICD-10-CM | POA: Diagnosis present

## 2022-07-23 LAB — ECHOCARDIOGRAM COMPLETE
Area-P 1/2: 2.83 cm2
Height: 63 in
P 1/2 time: 256 msec
S' Lateral: 3.9 cm
Weight: 2610.25 oz

## 2022-07-23 LAB — GLUCOSE, CAPILLARY: Glucose-Capillary: 94 mg/dL (ref 70–99)

## 2022-07-23 LAB — HEPARIN LEVEL (UNFRACTIONATED)
Heparin Unfractionated: 0.42 IU/mL (ref 0.30–0.70)
Heparin Unfractionated: 0.43 IU/mL (ref 0.30–0.70)

## 2022-07-23 LAB — CBC
HCT: 40.8 % (ref 36.0–46.0)
Hemoglobin: 13.2 g/dL (ref 12.0–15.0)
MCH: 30.8 pg (ref 26.0–34.0)
MCHC: 32.4 g/dL (ref 30.0–36.0)
MCV: 95.1 fL (ref 80.0–100.0)
Platelets: 194 10*3/uL (ref 150–400)
RBC: 4.29 MIL/uL (ref 3.87–5.11)
RDW: 14.1 % (ref 11.5–15.5)
WBC: 5.8 10*3/uL (ref 4.0–10.5)
nRBC: 0 % (ref 0.0–0.2)

## 2022-07-23 LAB — LIPID PANEL
Cholesterol: 183 mg/dL (ref 0–200)
HDL: 76 mg/dL (ref >40)
LDL Cholesterol: 82 mg/dL (ref 0–99)
Total CHOL/HDL Ratio: 2.4 RATIO
Triglycerides: 126 mg/dL (ref <150)
VLDL: 25 mg/dL (ref 0–40)

## 2022-07-23 MED ORDER — ASPIRIN 81 MG PO TBEC
81.0000 mg | DELAYED_RELEASE_TABLET | Freq: Every day | ORAL | Status: DC
  Administered 2022-07-23: 81 mg via ORAL
  Filled 2022-07-23: qty 1

## 2022-07-23 MED ORDER — ATORVASTATIN CALCIUM 40 MG PO TABS
40.0000 mg | ORAL_TABLET | Freq: Every day | ORAL | Status: DC
  Administered 2022-07-23: 40 mg via ORAL
  Filled 2022-07-23: qty 1

## 2022-07-23 MED ORDER — PERFLUTREN LIPID MICROSPHERE
1.0000 mL | INTRAVENOUS | Status: AC | PRN
  Administered 2022-07-23: 2 mL via INTRAVENOUS

## 2022-07-23 MED ORDER — CLOPIDOGREL BISULFATE 75 MG PO TABS
75.0000 mg | ORAL_TABLET | Freq: Every day | ORAL | 0 refills | Status: DC
  Filled 2022-07-23: qty 90, 90d supply, fill #0

## 2022-07-23 MED ORDER — ASPIRIN 81 MG PO TBEC
81.0000 mg | DELAYED_RELEASE_TABLET | Freq: Every day | ORAL | 7 refills | Status: AC
  Filled 2022-07-23: qty 90, 90d supply, fill #0

## 2022-07-23 MED ORDER — PANTOPRAZOLE SODIUM 40 MG PO TBEC
40.0000 mg | DELAYED_RELEASE_TABLET | Freq: Every day | ORAL | 0 refills | Status: DC
  Filled 2022-07-23: qty 90, 90d supply, fill #0

## 2022-07-23 MED ORDER — CLOPIDOGREL BISULFATE 75 MG PO TABS
75.0000 mg | ORAL_TABLET | Freq: Every day | ORAL | Status: DC
  Administered 2022-07-23: 75 mg via ORAL
  Filled 2022-07-23: qty 1

## 2022-07-23 MED ORDER — ATORVASTATIN CALCIUM 40 MG PO TABS
40.0000 mg | ORAL_TABLET | Freq: Every day | ORAL | 2 refills | Status: DC
  Filled 2022-07-23: qty 90, 90d supply, fill #0

## 2022-07-23 NOTE — Progress Notes (Addendum)
PROGRESS NOTE        PATIENT DETAILS Name: Melissa Love Age: 87 y.o. Sex: female Date of Birth: 1935/09/08 Admit Date: 07/21/2022 Admitting Physician Reubin Milan, MD [id number], Estill Bamberg., MD  Brief Summary: Patient is a 87 y.o.  female with history of SVT/PAT, HTN, hypothyroidism, macular degeneration of left eye-who presented with approximately 30 second episode of complete bilateral visual loss and unsteady gait.  She was thought to have TIA-CTA showed near occlusion of of left vertebral artery-she was subsequently admitted to the hospitalist service.  Significant events: 2/18>> admit to [facility].  Significant studies: 2/18>> CT angio head/neck: Near complete opacification of left V2 segment.  2/19>> 2/19 >>MRI brain: No acute CVA 2/19>> A1c: 5.3 2/20>> LDL: 82  Significant microbiology data:   Procedures:   Consults: None  Subjective: Lying comfortably in bed-denies any chest pain or shortness of breath.  Objective: Vitals: Blood pressure (!) 164/75, pulse 75, temperature 98.1 F (36.7 C), temperature source Oral, resp. rate 20, height 5' 3"$  (1.6 m), weight 74 kg, SpO2 100 %.   Exam: Gen Exam:Alert awake-not in any distress HEENT:atraumatic, normocephalic Chest: B/L clear to auscultation anteriorly CVS:S1S2 regular Abdomen:soft non tender, non distended Extremities:no edema Neurology: Non focal Skin: no rash  Pertinent Labs/Radiology:    Latest Ref Rng & Units 07/23/2022    3:16 AM 07/22/2022    7:48 AM 07/21/2022    8:50 PM  CBC  WBC 4.0 - 10.5 K/uL 5.8  6.0  5.9   Hemoglobin 12.0 - 15.0 g/dL 13.2  13.7  14.4   Hematocrit 36.0 - 46.0 % 40.8  41.4  44.0   Platelets 150 - 400 K/uL 194  200  221     Lab Results  Component Value Date   NA 143 07/21/2022   K 3.7 07/21/2022   CL 105 07/21/2022   CO2 28 07/21/2022      Assessment/Plan: TIA Left Vertebral artery near occlusion Vision is back to baseline Unclear if  this is chronic stenosis/thrombosis or dissection leading to thrombosis.  Given prior history of PAT-some suspicion that this may be a embolus as well.  Although no history of A-fib on prior outpatient heart monitor. Discussed with stroke MD-he will review chart-provide further recommendations. Await echo Mobilize with PT/OT and see how she does. Remains on IV heparin until stroke MD eval.  History of paroxysmal atrial tachycardia History of SVT Per prior cardiology documentation-she was unable to tolerate metoprolol-and was switched to Acebutolol Continue telemetry monitoring  HLD Switch from simvastatin to high intensity statin  HTN Allowing permissive hypertension Losartan/beta-blocker on hold  Hypothyroidism TSH stable Continue levothyroxine  Depression/anxiety Stable Zoloft  History of chronic recurrent UTIs Appears to be on suppressive therapy with cephalexin  1.5 cm right thyroid lobe lesion Incidental ending on CTA head/neck TSH stable Needs outpatient workup by PCP.  BMI: Estimated body mass index is 28.9 kg/m as calculated from the following:   Height as of this encounter: 5' 3"$  (1.6 m).   Weight as of this encounter: 74 kg.   Code status:   Code Status: Full Code   DVT Prophylaxis:IV heparin    Family Communication: None at bedside   Disposition Plan: Status is: Observation The patient remains OBS appropriate and will d/c before 2 midnights.   Planned Discharge Destination:Home today or tomorrow depending on work  up/Stroke MD eval   Diet: Diet Order             Diet Heart Room service appropriate? Yes; Fluid consistency: Thin  Diet effective now                     Antimicrobial agents: Anti-infectives (From admission, onward)    Start     Dose/Rate Route Frequency Ordered Stop   07/22/22 1000  cephALEXin (KEFLEX) capsule 250 mg  Status:  Discontinued        250 mg Oral Daily 07/21/22 2348 07/22/22 0017   07/22/22 0030  cephALEXin  (KEFLEX) capsule 250 mg        250 mg Oral Daily at bedtime 07/22/22 0017          MEDICATIONS: Scheduled Meds:   stroke: early stages of recovery book   Does not apply Once   atorvastatin  40 mg Oral Daily   cephALEXin  250 mg Oral QHS   levothyroxine  25 mcg Oral QAC breakfast   LORazepam  0.5 mg Oral QHS   pantoprazole  40 mg Oral Daily   sertraline  50 mg Oral QHS   Continuous Infusions:  heparin 900 Units/hr (07/23/22 0331)   PRN Meds:.acetaminophen, ALPRAZolam, hydroxypropyl methylcellulose / hypromellose, ondansetron **OR** ondansetron (ZOFRAN) IV   I have personally reviewed following labs and imaging studies  LABORATORY DATA: CBC: Recent Labs  Lab 07/21/22 2050 07/22/22 0748 07/23/22 0316  WBC 5.9 6.0 5.8  NEUTROABS 3.3  --   --   HGB 14.4 13.7 13.2  HCT 44.0 41.4 40.8  MCV 94.2 92.4 95.1  PLT 221 200 [id number]    Basic Metabolic Panel: Recent Labs  Lab 07/21/22 2050  NA 143  K 3.7  CL 105  CO2 28  GLUCOSE 152*  BUN 17  CREATININE 0.61  CALCIUM 10.0    GFR: Estimated Creatinine Clearance: 50.4 mL/min (by C-G formula based on SCr of 0.61 mg/dL).  Liver Function Tests: Recent Labs  Lab 07/21/22 2050  AST 18  ALT 23  ALKPHOS 90  BILITOT 0.3  PROT 7.0  ALBUMIN 4.7   No results for input(s): "LIPASE", "AMYLASE" in the last 168 hours. No results for input(s): "AMMONIA" in the last 168 hours.  Coagulation Profile: No results for input(s): "INR", "PROTIME" in the last 168 hours.  Cardiac Enzymes: No results for input(s): "CKTOTAL", "CKMB", "CKMBINDEX", "TROPONINI" in the last 168 hours.  BNP (last 3 results) No results for input(s): "PROBNP" in the last 8760 hours.  Lipid Profile: Recent Labs    07/23/22 0316  CHOL 183  HDL 76  LDLCALC 82  TRIG 126  CHOLHDL 2.4    Thyroid Function Tests: Recent Labs    07/22/22 1229  TSH 2.208    Anemia Panel: No results for input(s): "VITAMINB12", "FOLATE", "FERRITIN", "TIBC", "IRON",  "RETICCTPCT" in the last 72 hours.  Urine analysis: No results found for: "COLORURINE", "APPEARANCEUR", "LABSPEC", "PHURINE", "GLUCOSEU", "HGBUR", "BILIRUBINUR", "KETONESUR", "PROTEINUR", "UROBILINOGEN", "NITRITE", "LEUKOCYTESUR"  Sepsis Labs: Lactic Acid, Venous No results found for: "LATICACIDVEN"  MICROBIOLOGY: No results found for this or any previous visit (from the past 240 hour(s)).  RADIOLOGY STUDIES/RESULTS: MR BRAIN WO CONTRAST  Result Date: 07/22/2022 CLINICAL DATA:  Neuro deficit, acute, stroke suspected EXAM: MRI HEAD WITHOUT CONTRAST TECHNIQUE: Multiplanar, multiecho pulse sequences of the brain and surrounding structures were obtained without intravenous contrast. COMPARISON:  CTA head/neck from July 21, 2022. FINDINGS: Brain: No acute infarction, hemorrhage, hydrocephalus, extra-axial collection  or mass lesion. Mild for age chronic microvascular ischemic change. Vascular: Major arterial flow voids are maintained at the skull base. Skull and upper cervical spine: Normal marrow signal. Sinuses/Orbits: Clear sinuses.  No acute orbital findings. Other: No mastoid effusions.  Head IMPRESSION: No evidence of acute intracranial abnormality. Electronically Signed   By: Margaretha Sheffield M.D.   On: 07/22/2022 17:35   CT ANGIO HEAD NECK W WO CM  Result Date: 07/21/2022 CLINICAL DATA:  Hypertension, blurred vision, weakness EXAM: CT ANGIOGRAPHY HEAD AND NECK TECHNIQUE: Multidetector CT imaging of the head and neck was performed using the standard protocol during bolus administration of intravenous contrast. Multiplanar CT image reconstructions and MIPs were obtained to evaluate the vascular anatomy. Carotid stenosis measurements (when applicable) are obtained utilizing NASCET criteria, using the distal internal carotid diameter as the denominator. RADIATION DOSE REDUCTION: This exam was performed according to the departmental dose-optimization program which includes automated exposure  control, adjustment of the mA and/or kV according to patient size and/or use of iterative reconstruction technique. CONTRAST:  38m OMNIPAQUE IOHEXOL 350 MG/ML SOLN COMPARISON:  No prior CTA available, correlation is made with CT head 03/22/2006 FINDINGS: CT HEAD FINDINGS Brain: No evidence of acute infarct, hemorrhage, mass, mass effect, or midline shift. No hydrocephalus or extra-axial fluid collection. Normal cerebral volume for age. Vascular: No hyperdense vessel. Skull: Negative for fracture or focal lesion. Sinuses/Orbits: No acute finding. Status post bilateral lens replacements. Other: The mastoid air cells are well aerated. CTA NECK FINDINGS Aortic arch: Standard branching. Imaged portion shows no evidence of aneurysm or dissection. No significant stenosis of the major arch vessel origins. Aortic atherosclerosis. Right carotid system: No evidence of dissection, occlusion, or hemodynamically significant stenosis (greater than 50%). Left carotid system: No evidence of dissection, occlusion, or hemodynamically significant stenosis (greater than 50%). Vertebral arteries: The right vertebral artery is patent from its origin to the skull base, without significant stenosis, dissection, or occlusion. The left vertebral artery is patent at its origin and in the left V1 segment. Diminishing opacification of the left V2 segment (series 10, image 238), and severe stenosis in the proximal V3 segment (series 10, image 178), with improved opacification and caliber in the distal left V3 segment, likely via collaterals. No evidence of dissection the left vertebral artery. Skeleton: No acute osseous abnormality. Degenerative changes in the cervical spine. Other neck: Hypoenhancing lesions. No otherwise negative. In the right thyroid lobe, which measure up to 1.5 cm Upper chest: No focal pulmonary opacity or pleural effusion. Review of the MIP images confirms the above findings CTA HEAD FINDINGS Anterior circulation: Both  internal carotid arteries are patent to the termini, without significant stenosis. A1 segments patent. Normal anterior communicating artery. Anterior cerebral arteries are patent to their distal aspects. No M1 stenosis or occlusion. MCA branches perfused and symmetric. Posterior circulation: Vertebral arteries patent to the vertebrobasilar junction without stenosis. Basilar patent to its distal aspect. Superior cerebellar arteries patent proximally. Patent P1 segments. PCAs perfused to their distal aspects without stenosis. The right posterior communicating artery is patent. Venous sinuses: As permitted by contrast timing, patent. Anatomic variants: None significant. Review of the MIP images confirms the above findings IMPRESSION: 1. Diminishing opacification of the left V2 segment along its course, concerning for thrombosis, favored to be acute. Reconstitution in the left V3 segment, likely due to collaterals. 2. No acute intracranial process. 3. No intracranial large vessel occlusion or significant stenosis. 4. No hemodynamically significant stenosis in the neck. 5. Hypoenhancing lesions in  the right thyroid lobe, which measure up to 1.5 cm. If this has not previously been evaluated, a non-emergent ultrasound of the thyroid is recommended. (Reference: J Am Coll Radiol. 2015 Feb;12(2): 143-50) 6. Aortic atherosclerosis. Aortic Atherosclerosis (ICD10-I70.0). These results were called by telephone at the time of interpretation on 07/21/2022 at 10:03 pm to provider DAN FLOYD , who verbally acknowledged these results. Electronically Signed   By: Merilyn Baba M.D.   On: 07/21/2022 22:08     LOS: 0 days   Oren Binet, MD  [facility]    To contact the attending provider between 7A-7P or the covering provider during after hours 7P-7A, please log into the web site [email or web address] and access using universal [place] password for that web site. If you do not have the password, please call the hospital  operator.  07/23/2022, 8:47 AM

## 2022-07-23 NOTE — Discharge Summary (Signed)
PATIENT DETAILS Name: Melissa Love Age: 87 y.o. Sex: female Date of Birth: Dec 05, 1935 MRN: [id number]. Admitting Physician: Dory Horn, MD [id number], Estill Bamberg., MD  Admit Date: 07/21/2022 Discharge date: 07/23/2022  Recommendations for Outpatient Follow-up:  Follow up with PCP in 1-2 weeks Please obtain CMP/CBC in one week Incidental finding-1.5 right thyroid lobe lesion-needs further workup to be done in the outpatient setting  Admitted From:  Home  Disposition: Home   Discharge Condition: good  CODE STATUS:   Code Status: Full Code   Diet recommendation:  Diet Order             Diet - low sodium heart healthy           Diet Heart Room service appropriate? Yes; Fluid consistency: Thin  Diet effective now                    Brief Summary: Patient is a 87 y.o.  female with history of SVT/PAT, HTN, hypothyroidism, macular degeneration of left eye-who presented with approximately 30 second episode of complete bilateral visual loss and unsteady gait.  She was thought to have TIA-CTA showed near occlusion of of left vertebral artery-she was subsequently admitted to the hospitalist service.   Significant events: 2/18>> admit to [facility].   Significant studies: 2/18>> CT angio head/neck: Near complete opacification of left V2 segment.  2/19>> 2/19 >>MRI brain: No acute CVA 2/19>> A1c: 5.3 2/20>> LDL: 82 2/20>> echo: EF 65-70%   Significant microbiology data: None   Procedures: None   Consults: Neurology  Brief Hospital Course: TIA Left Vertebral artery near occlusion Vision is back to baseline Unclear if this is chronic stenosis/thrombosis or dissection leading to thrombosis.  Given prior history of PAT-some suspicion that this may be a embolus as well.  Although no history of A-fib on prior outpatient heart monitor. Workup as above-discussed with stroke MD Dr. Gerome Sam of aspirin/Plavix x 3 months followed by aspirin alone.      History of paroxysmal atrial tachycardia History of SVT Per prior cardiology documentation-she was unable to tolerate metoprolol-and was switched to Acebutolol No obvious atrial fibrillation observed in telemetry.   HLD Switch from simvastatin to high intensity statin   HTN Allowing permissive hypertension Losartan/beta-blocker on hold-okay to go home on discharge.   Hypothyroidism TSH stable Continue levothyroxine   Depression/anxiety Stable Zoloft   History of chronic recurrent UTIs Appears to be on suppressive therapy with cephalexin   1.5 cm right thyroid lobe lesion Incidental ending on CTA head/neck TSH stable Needs outpatient workup by PCP.   BMI: Estimated body mass index is 28.9 kg/m as calculated from the following:   Height as of this encounter: 5' 3"$  (1.6 m).   Weight as of this encounter: 74 kg.    Discharge Diagnoses:  Principal Problem:   TIA (transient ischemic attack) Active Problems:   Hypertension   Hypothyroidism   Depression   GERD (gastroesophageal reflux disease)   Hyperlipidemia   History of recurrent UTIs   Discharge Instructions:  Activity:  As tolerated with Full fall precautions use walker/cane & assistance as needed Discharge Instructions     Ambulatory referral to Neurology   Complete by: As directed    Follow up with Dr. Jaynee Eagles at [facility] in 3-4 weeks. Thanks.   Call MD for:  difficulty breathing, headache or visual disturbances   Complete by: As directed    Call MD for:  extreme fatigue   Complete by: As directed  Call MD for:  persistant dizziness or light-headedness   Complete by: As directed    Diet - low sodium heart healthy   Complete by: As directed    Discharge instructions   Complete by: As directed    Follow with Primary MD  Helen Hashimoto., MD in 1-2 weeks  Aspirin/Plavix x 3 months-after 3 months stop Plavix and continue on aspirin  Incidental finding-right thyroid lobe lesion seen on CT scan of your  neck, please ask your primary care practitioner to initiate further workup.  In some rare cases-these lesions can turn cancerous.  Please get a complete blood count and chemistry panel checked by your Primary MD at your next visit, and again as instructed by your Primary MD.  Get Medicines reviewed and adjusted: Please take all your medications with you for your next visit with your Primary MD  Laboratory/radiological data: Please request your Primary MD to go over all hospital tests and procedure/radiological results at the follow up, please ask your Primary MD to get all Hospital records sent to his/her office.  In some cases, they will be blood work, cultures and biopsy results pending at the time of your discharge. Please request that your primary care M.D. follows up on these results.  Also Note the following: If you experience worsening of your admission symptoms, develop shortness of breath, life threatening emergency, suicidal or homicidal thoughts you must seek medical attention immediately by calling 911 or calling your MD immediately  if symptoms less severe.  You must read complete instructions/literature along with all the possible adverse reactions/side effects for all the Medicines you take and that have been prescribed to you. Take any new Medicines after you have completely understood and accpet all the possible adverse reactions/side effects.   Do not drive when taking Pain medications or sleeping medications (Benzodaizepines)  Do not take more than prescribed Pain, Sleep and Anxiety Medications. It is not advisable to combine anxiety,sleep and pain medications without talking with your primary care practitioner  Special Instructions: If you have smoked or chewed Tobacco  in the last 2 yrs please stop smoking, stop any regular Alcohol  and or any Recreational drug use.  Wear Seat belts while driving.  Please note: You were cared for by a hospitalist during your hospital  stay. Once you are discharged, your primary care physician will handle any further medical issues. Please note that NO REFILLS for any discharge medications will be authorized once you are discharged, as it is imperative that you return to your primary care physician (or establish a relationship with a primary care physician if you do not have one) for your post hospital discharge needs so that they can reassess your need for medications and monitor your lab values.   Increase activity slowly   Complete by: As directed       Allergies as of 07/23/2022       Reactions   Hiprex [methenamine] Rash   Other reaction(s): Fever Chills   Morphine Swelling   Other reaction(s): Other   Penicillins Rash   Did it involve swelling of the face/tongue/throat, SOB, or low BP? No Did it involve sudden or severe rash/hives, skin peeling, or any reaction on the inside of your mouth or nose? Yes Did you need to seek medical attention at a hospital or doctor's office? Yes When did it last happen?      4 years If all above answers are "NO", may proceed with cephalosporin use.   Sulfa  Antibiotics Rash   Sulfamethoxazole-trimethoprim Rash   Nausea        Medication List     STOP taking these medications    simvastatin 20 MG tablet Commonly known as: ZOCOR       TAKE these medications    acebutolol 200 MG capsule Commonly known as: SECTRAL Take 1 capsule (200 mg total) by mouth 2 (two) times daily.   acetaminophen 650 MG CR tablet Commonly known as: TYLENOL Take 650 mg by mouth every 8 (eight) hours.   aspirin EC 81 MG tablet Take 1 tablet (81 mg total) by mouth daily. Swallow whole.   atorvastatin 40 MG tablet Commonly known as: LIPITOR Take 1 tablet (40 mg total) by mouth daily. Start taking on: July 24, 2022   calcium carbonate 500 MG chewable tablet Commonly known as: TUMS - dosed in mg elemental calcium Chew 1 tablet by mouth daily as needed for indigestion or heartburn.    cephALEXin 250 MG capsule Commonly known as: KEFLEX Take 250 mg by mouth daily.   cetirizine 10 MG tablet Commonly known as: ZYRTEC Take 10 mg by mouth daily.   clopidogrel 75 MG tablet Commonly known as: PLAVIX Take 1 tablet (75 mg total) by mouth daily. Take along with aspirin x 3 months-after 3 months-stop Plavix and continue aspirin   CoQ10 100 MG Caps Take 100 mg by mouth daily.   fluticasone 50 MCG/ACT nasal spray Commonly known as: FLONASE Place 2 sprays into both nostrils daily.   levothyroxine 25 MCG tablet Commonly known as: SYNTHROID Take 25 mcg by mouth daily before breakfast.   LORazepam 0.5 MG tablet Commonly known as: ATIVAN Take 0.5 mg by mouth at bedtime.   losartan 50 MG tablet Commonly known as: COZAAR TAKE ONE (1) TABLET BY MOUTH EVERY DAY What changed: how much to take   Magnesium 200 MG Tabs Take 200 mg by mouth daily as needed (Muscle cramps).   pantoprazole 40 MG tablet Commonly known as: PROTONIX Take 1 tablet (40 mg total) by mouth daily. Start taking on: July 24, 2022   PreserVision AREDS 2 Caps Take 1 capsule by mouth daily.   CENTRUM SILVER PO Take 1 tablet by mouth daily.   sertraline 50 MG tablet Commonly known as: ZOLOFT Take 50 mg by mouth at bedtime.   SYSTANE FREE OP Place 1 drop into both eyes as needed (Dry eye).        Follow-up Information     Melvenia Beam, MD. Schedule an appointment as soon as possible for a visit in 1 month(s).   Specialty: Neurology Contact information: [place] [address] [phone]         Helen Hashimoto., MD. Schedule an appointment as soon as possible for a visit in 1 week(s).   Specialty: Internal Medicine Contact information: [address] [place] [phone]         Richardo Priest, MD. Schedule an appointment as soon as possible for a visit in 1 week(s).   Specialties: Cardiology, Radiology Contact  information: [address] [place] [address] [phone]                Allergies  Allergen Reactions   Hiprex [Methenamine] Rash    Other reaction(s): Fever Chills   Morphine Swelling    Other reaction(s): Other   Penicillins Rash    Did it involve swelling of the face/tongue/throat, SOB, or low BP? No Did  it involve sudden or severe rash/hives, skin peeling, or any reaction on the inside of your mouth or nose? Yes Did you need to seek medical attention at a hospital or doctor's office? Yes When did it last happen?      4 years If all above answers are "NO", may proceed with cephalosporin use.    Sulfa Antibiotics Rash   Sulfamethoxazole-Trimethoprim Rash    Nausea     Other Procedures/Studies: ECHOCARDIOGRAM COMPLETE  Result Date: 07/23/2022    ECHOCARDIOGRAM REPORT   Patient Name:   Melissa Love Date of Exam: 07/23/2022 Medical Rec #:  [id number]       Height:       63.0 in Accession #:    [id number]      Weight:       163.1 lb Date of Birth:  05-24-1936       BSA:          1.773 m Patient Age:    29 years        BP:           171/79 mmHg Patient Gender: F               HR:           72 bpm. Exam Location:  Inpatient Procedure: 2D Echo, Color Doppler, Cardiac Doppler and Intracardiac            Opacification Agent Indications:    TIA  History:        Patient has no prior history of Echocardiogram examinations.                 Risk Factors:Hypertension.  Sonographer:    Eartha Inch Referring Phys: [id number] [place]  Sonographer Comments: Technically difficult study due to poor echo windows. Image acquisition challenging due to patient body habitus and Image acquisition challenging due to respiratory motion. IMPRESSIONS  1. LV function best assessed on contrast views (87). Left ventricular ejection fraction, by estimation, is 65 to 70%. The left ventricle has normal function. The left ventricle has no regional wall motion abnormalities. Left ventricular  diastolic parameters are consistent with Grade I diastolic dysfunction (impaired relaxation).  2. Right ventricular systolic function is normal. The right ventricular size is normal.  3. The mitral valve is grossly normal. No evidence of mitral valve regurgitation.  4. Mild aortic regurgitation, with vena contracta 2.5 mm. The aortic valve was not well visualized. Aortic valve regurgitation is mild. Aortic regurgitation PHT measures 256 msec.  5. The inferior vena cava is normal in size with greater than 50% respiratory variability, suggesting right atrial pressure of 3 mmHg. Comparison(s): No prior Echocardiogram. FINDINGS  Left Ventricle: LV function best assessed on contrast views (87). Left ventricular ejection fraction, by estimation, is 65 to 70%. The left ventricle has normal function. The left ventricle has no regional wall motion abnormalities. Definity contrast agent was given IV to delineate the left ventricular endocardial borders. The left ventricular internal cavity size was normal in size. There is no left ventricular hypertrophy. Left ventricular diastolic parameters are consistent with Grade I diastolic dysfunction (impaired relaxation). Right Ventricle: The right ventricular size is normal. No increase in right ventricular wall thickness. Right ventricular systolic function is normal. Left Atrium: Left atrial size was normal in size. Right Atrium: Right atrial size was normal in size. Pericardium: There is no evidence of pericardial effusion. Presence of epicardial fat layer. Mitral Valve: The mitral valve is grossly normal. Mild  mitral annular calcification. No evidence of mitral valve regurgitation. Tricuspid Valve: The tricuspid valve is not well visualized. Tricuspid valve regurgitation is not demonstrated. No evidence of tricuspid stenosis. Aortic Valve: Mild aortic regurgitation, with vena contracta 2.5 mm. The aortic valve was not well visualized. Aortic valve regurgitation is mild. Aortic  regurgitation PHT measures 256 msec. Pulmonic Valve: The pulmonic valve was not well visualized. Pulmonic valve regurgitation is not visualized. No evidence of pulmonic stenosis. Aorta: The aortic root is normal in size and structure. Venous: The inferior vena cava is normal in size with greater than 50% respiratory variability, suggesting right atrial pressure of 3 mmHg. IAS/Shunts: No atrial level shunt detected by color flow Doppler.  LEFT VENTRICLE PLAX 2D LVIDd:         4.50 cm   Diastology LVIDs:         3.90 cm   LV e' medial:    4.35 cm/s LV PW:         0.80 cm   LV E/e' medial:  15.0 LV IVS:        0.80 cm   LV e' lateral:   6.96 cm/s LVOT diam:     2.00 cm   LV E/e' lateral: 9.4 LV SV:         47 LV SV Index:   27 LVOT Area:     3.14 cm  RIGHT VENTRICLE RV S prime:     12.30 cm/s TAPSE (M-mode): 1.7 cm LEFT ATRIUM             Index        RIGHT ATRIUM           Index LA diam:        2.60 cm 1.47 cm/m   RA Area:     12.60 cm LA Vol (A2C):   34.3 ml 19.34 ml/m  RA Volume:   27.80 ml  15.68 ml/m LA Vol (A4C):   24.0 ml 13.53 ml/m LA Biplane Vol: 29.8 ml 16.81 ml/m  AORTIC VALVE LVOT Vmax:   75.80 cm/s LVOT Vmean:  52.300 cm/s LVOT VTI:    0.151 m AI PHT:      256 msec  AORTA Ao Root diam: 3.00 cm MITRAL VALVE MV Area (PHT): 2.83 cm    SHUNTS MV Decel Time: 268 msec    Systemic VTI:  0.15 m MV E velocity: 65.10 cm/s  Systemic Diam: 2.00 cm MV A velocity: 77.10 cm/s MV E/A ratio:  0.84 Melissa Haskell MD Electronically signed by Melissa Haskell MD Signature Date/Time: 07/23/2022/9:45:13 AM    Final    MR BRAIN WO CONTRAST  Result Date: 07/22/2022 CLINICAL DATA:  Neuro deficit, acute, stroke suspected EXAM: MRI HEAD WITHOUT CONTRAST TECHNIQUE: Multiplanar, multiecho pulse sequences of the brain and surrounding structures were obtained without intravenous contrast. COMPARISON:  CTA head/neck from July 21, 2022. FINDINGS: Brain: No acute infarction, hemorrhage, hydrocephalus, extra-axial  collection or mass lesion. Mild for age chronic microvascular ischemic change. Vascular: Major arterial flow voids are maintained at the skull base. Skull and upper cervical spine: Normal marrow signal. Sinuses/Orbits: Clear sinuses.  No acute orbital findings. Other: No mastoid effusions.  Head IMPRESSION: No evidence of acute intracranial abnormality. Electronically Signed   By: Margaretha Sheffield M.D.   On: 07/22/2022 17:35   CT ANGIO HEAD NECK W WO CM  Result Date: 07/21/2022 CLINICAL DATA:  Hypertension, blurred vision, weakness EXAM: CT ANGIOGRAPHY HEAD AND NECK TECHNIQUE: Multidetector CT imaging of the head  and neck was performed using the standard protocol during bolus administration of intravenous contrast. Multiplanar CT image reconstructions and MIPs were obtained to evaluate the vascular anatomy. Carotid stenosis measurements (when applicable) are obtained utilizing NASCET criteria, using the distal internal carotid diameter as the denominator. RADIATION DOSE REDUCTION: This exam was performed according to the departmental dose-optimization program which includes automated exposure control, adjustment of the mA and/or kV according to patient size and/or use of iterative reconstruction technique. CONTRAST:  86m OMNIPAQUE IOHEXOL 350 MG/ML SOLN COMPARISON:  No prior CTA available, correlation is made with CT head 03/22/2006 FINDINGS: CT HEAD FINDINGS Brain: No evidence of acute infarct, hemorrhage, mass, mass effect, or midline shift. No hydrocephalus or extra-axial fluid collection. Normal cerebral volume for age. Vascular: No hyperdense vessel. Skull: Negative for fracture or focal lesion. Sinuses/Orbits: No acute finding. Status post bilateral lens replacements. Other: The mastoid air cells are well aerated. CTA NECK FINDINGS Aortic arch: Standard branching. Imaged portion shows no evidence of aneurysm or dissection. No significant stenosis of the major arch vessel origins. Aortic atherosclerosis.  Right carotid system: No evidence of dissection, occlusion, or hemodynamically significant stenosis (greater than 50%). Left carotid system: No evidence of dissection, occlusion, or hemodynamically significant stenosis (greater than 50%). Vertebral arteries: The right vertebral artery is patent from its origin to the skull base, without significant stenosis, dissection, or occlusion. The left vertebral artery is patent at its origin and in the left V1 segment. Diminishing opacification of the left V2 segment (series 10, image 238), and severe stenosis in the proximal V3 segment (series 10, image 178), with improved opacification and caliber in the distal left V3 segment, likely via collaterals. No evidence of dissection the left vertebral artery. Skeleton: No acute osseous abnormality. Degenerative changes in the cervical spine. Other neck: Hypoenhancing lesions. No otherwise negative. In the right thyroid lobe, which measure up to 1.5 cm Upper chest: No focal pulmonary opacity or pleural effusion. Review of the MIP images confirms the above findings CTA HEAD FINDINGS Anterior circulation: Both internal carotid arteries are patent to the termini, without significant stenosis. A1 segments patent. Normal anterior communicating artery. Anterior cerebral arteries are patent to their distal aspects. No M1 stenosis or occlusion. MCA branches perfused and symmetric. Posterior circulation: Vertebral arteries patent to the vertebrobasilar junction without stenosis. Basilar patent to its distal aspect. Superior cerebellar arteries patent proximally. Patent P1 segments. PCAs perfused to their distal aspects without stenosis. The right posterior communicating artery is patent. Venous sinuses: As permitted by contrast timing, patent. Anatomic variants: None significant. Review of the MIP images confirms the above findings IMPRESSION: 1. Diminishing opacification of the left V2 segment along its course, concerning for thrombosis,  favored to be acute. Reconstitution in the left V3 segment, likely due to collaterals. 2. No acute intracranial process. 3. No intracranial large vessel occlusion or significant stenosis. 4. No hemodynamically significant stenosis in the neck. 5. Hypoenhancing lesions in the right thyroid lobe, which measure up to 1.5 cm. If this has not previously been evaluated, a non-emergent ultrasound of the thyroid is recommended. (Reference: J Am Coll Radiol. 2015 Feb;12(2): 143-50) 6. Aortic atherosclerosis. Aortic Atherosclerosis (ICD10-I70.0). These results were called by telephone at the time of interpretation on 07/21/2022 at 10:03 pm to provider DAN FLOYD , who verbally acknowledged these results. Electronically Signed   By: AMerilyn BabaM.D.   On: 07/21/2022 22:08     TODAY-DAY OF DISCHARGE:  Subjective:   Melissa Quinonestoday has no headache,no chest  abdominal pain,no new weakness tingling or numbness, feels much better wants to go home today.   Objective:   Blood pressure (!) 146/71, pulse 82, temperature 97.6 F (36.4 C), temperature source Oral, resp. rate 13, height 5' 3"$  (1.6 m), weight 74 kg, SpO2 100 %.  Intake/Output Summary (Last 24 hours) at 07/23/2022 1412 Last data filed at 07/23/2022 1238 Gross per 24 hour  Intake 560.62 ml  Output 1050 ml  Net -489.38 ml   Filed Weights   07/21/22 2230  Weight: 74 kg    Exam: Awake Alert, Oriented *3, No new F.N deficits, Normal affect [place].AT,PERRAL Supple Neck,No JVD, No cervical lymphadenopathy appriciated.  Symmetrical Chest wall movement, Good air movement bilaterally, CTAB RRR,No Gallops,Rubs or new Murmurs, No Parasternal Heave +ve B.Sounds, Abd Soft, Non tender, No organomegaly appriciated, No rebound -guarding or rigidity. No Cyanosis, Clubbing or edema, No new Rash or bruise   PERTINENT RADIOLOGIC STUDIES: ECHOCARDIOGRAM COMPLETE  Result Date: 07/23/2022    ECHOCARDIOGRAM REPORT   Patient Name:   Melissa Love Date of Exam:  07/23/2022 Medical Rec #:  [id number]       Height:       63.0 in Accession #:    [id number]      Weight:       163.1 lb Date of Birth:  05-19-1936       BSA:          1.773 m Patient Age:    49 years        BP:           171/79 mmHg Patient Gender: F               HR:           72 bpm. Exam Location:  Inpatient Procedure: 2D Echo, Color Doppler, Cardiac Doppler and Intracardiac            Opacification Agent Indications:    TIA  History:        Patient has no prior history of Echocardiogram examinations.                 Risk Factors:Hypertension.  Sonographer:    Eartha Inch Referring Phys: [id number] [place]  Sonographer Comments: Technically difficult study due to poor echo windows. Image acquisition challenging due to patient body habitus and Image acquisition challenging due to respiratory motion. IMPRESSIONS  1. LV function best assessed on contrast views (87). Left ventricular ejection fraction, by estimation, is 65 to 70%. The left ventricle has normal function. The left ventricle has no regional wall motion abnormalities. Left ventricular diastolic parameters are consistent with Grade I diastolic dysfunction (impaired relaxation).  2. Right ventricular systolic function is normal. The right ventricular size is normal.  3. The mitral valve is grossly normal. No evidence of mitral valve regurgitation.  4. Mild aortic regurgitation, with vena contracta 2.5 mm. The aortic valve was not well visualized. Aortic valve regurgitation is mild. Aortic regurgitation PHT measures 256 msec.  5. The inferior vena cava is normal in size with greater than 50% respiratory variability, suggesting right atrial pressure of 3 mmHg. Comparison(s): No prior Echocardiogram. FINDINGS  Left Ventricle: LV function best assessed on contrast views (87). Left ventricular ejection fraction, by estimation, is 65 to 70%. The left ventricle has normal function. The left ventricle has no regional wall motion abnormalities. Definity  contrast agent was given IV to delineate the left ventricular endocardial borders. The left ventricular internal cavity size  was normal in size. There is no left ventricular hypertrophy. Left ventricular diastolic parameters are consistent with Grade I diastolic dysfunction (impaired relaxation). Right Ventricle: The right ventricular size is normal. No increase in right ventricular wall thickness. Right ventricular systolic function is normal. Left Atrium: Left atrial size was normal in size. Right Atrium: Right atrial size was normal in size. Pericardium: There is no evidence of pericardial effusion. Presence of epicardial fat layer. Mitral Valve: The mitral valve is grossly normal. Mild mitral annular calcification. No evidence of mitral valve regurgitation. Tricuspid Valve: The tricuspid valve is not well visualized. Tricuspid valve regurgitation is not demonstrated. No evidence of tricuspid stenosis. Aortic Valve: Mild aortic regurgitation, with vena contracta 2.5 mm. The aortic valve was not well visualized. Aortic valve regurgitation is mild. Aortic regurgitation PHT measures 256 msec. Pulmonic Valve: The pulmonic valve was not well visualized. Pulmonic valve regurgitation is not visualized. No evidence of pulmonic stenosis. Aorta: The aortic root is normal in size and structure. Venous: The inferior vena cava is normal in size with greater than 50% respiratory variability, suggesting right atrial pressure of 3 mmHg. IAS/Shunts: No atrial level shunt detected by color flow Doppler.  LEFT VENTRICLE PLAX 2D LVIDd:         4.50 cm   Diastology LVIDs:         3.90 cm   LV e' medial:    4.35 cm/s LV PW:         0.80 cm   LV E/e' medial:  15.0 LV IVS:        0.80 cm   LV e' lateral:   6.96 cm/s LVOT diam:     2.00 cm   LV E/e' lateral: 9.4 LV SV:         47 LV SV Index:   27 LVOT Area:     3.14 cm  RIGHT VENTRICLE RV S prime:     12.30 cm/s TAPSE (M-mode): 1.7 cm LEFT ATRIUM             Index        RIGHT ATRIUM            Index LA diam:        2.60 cm 1.47 cm/m   RA Area:     12.60 cm LA Vol (A2C):   34.3 ml 19.34 ml/m  RA Volume:   27.80 ml  15.68 ml/m LA Vol (A4C):   24.0 ml 13.53 ml/m LA Biplane Vol: 29.8 ml 16.81 ml/m  AORTIC VALVE LVOT Vmax:   75.80 cm/s LVOT Vmean:  52.300 cm/s LVOT VTI:    0.151 m AI PHT:      256 msec  AORTA Ao Root diam: 3.00 cm MITRAL VALVE MV Area (PHT): 2.83 cm    SHUNTS MV Decel Time: 268 msec    Systemic VTI:  0.15 m MV E velocity: 65.10 cm/s  Systemic Diam: 2.00 cm MV A velocity: 77.10 cm/s MV E/A ratio:  0.84 Melissa Haskell MD Electronically signed by Melissa Haskell MD Signature Date/Time: 07/23/2022/9:45:13 AM    Final    MR BRAIN WO CONTRAST  Result Date: 07/22/2022 CLINICAL DATA:  Neuro deficit, acute, stroke suspected EXAM: MRI HEAD WITHOUT CONTRAST TECHNIQUE: Multiplanar, multiecho pulse sequences of the brain and surrounding structures were obtained without intravenous contrast. COMPARISON:  CTA head/neck from July 21, 2022. FINDINGS: Brain: No acute infarction, hemorrhage, hydrocephalus, extra-axial collection or mass lesion. Mild for age chronic microvascular ischemic change. Vascular: Major arterial flow  voids are maintained at the skull base. Skull and upper cervical spine: Normal marrow signal. Sinuses/Orbits: Clear sinuses.  No acute orbital findings. Other: No mastoid effusions.  Head IMPRESSION: No evidence of acute intracranial abnormality. Electronically Signed   By: Margaretha Sheffield M.D.   On: 07/22/2022 17:35   CT ANGIO HEAD NECK W WO CM  Result Date: 07/21/2022 CLINICAL DATA:  Hypertension, blurred vision, weakness EXAM: CT ANGIOGRAPHY HEAD AND NECK TECHNIQUE: Multidetector CT imaging of the head and neck was performed using the standard protocol during bolus administration of intravenous contrast. Multiplanar CT image reconstructions and MIPs were obtained to evaluate the vascular anatomy. Carotid stenosis measurements (when applicable) are  obtained utilizing NASCET criteria, using the distal internal carotid diameter as the denominator. RADIATION DOSE REDUCTION: This exam was performed according to the departmental dose-optimization program which includes automated exposure control, adjustment of the mA and/or kV according to patient size and/or use of iterative reconstruction technique. CONTRAST:  60m OMNIPAQUE IOHEXOL 350 MG/ML SOLN COMPARISON:  No prior CTA available, correlation is made with CT head 03/22/2006 FINDINGS: CT HEAD FINDINGS Brain: No evidence of acute infarct, hemorrhage, mass, mass effect, or midline shift. No hydrocephalus or extra-axial fluid collection. Normal cerebral volume for age. Vascular: No hyperdense vessel. Skull: Negative for fracture or focal lesion. Sinuses/Orbits: No acute finding. Status post bilateral lens replacements. Other: The mastoid air cells are well aerated. CTA NECK FINDINGS Aortic arch: Standard branching. Imaged portion shows no evidence of aneurysm or dissection. No significant stenosis of the major arch vessel origins. Aortic atherosclerosis. Right carotid system: No evidence of dissection, occlusion, or hemodynamically significant stenosis (greater than 50%). Left carotid system: No evidence of dissection, occlusion, or hemodynamically significant stenosis (greater than 50%). Vertebral arteries: The right vertebral artery is patent from its origin to the skull base, without significant stenosis, dissection, or occlusion. The left vertebral artery is patent at its origin and in the left V1 segment. Diminishing opacification of the left V2 segment (series 10, image 238), and severe stenosis in the proximal V3 segment (series 10, image 178), with improved opacification and caliber in the distal left V3 segment, likely via collaterals. No evidence of dissection the left vertebral artery. Skeleton: No acute osseous abnormality. Degenerative changes in the cervical spine. Other neck: Hypoenhancing lesions.  No otherwise negative. In the right thyroid lobe, which measure up to 1.5 cm Upper chest: No focal pulmonary opacity or pleural effusion. Review of the MIP images confirms the above findings CTA HEAD FINDINGS Anterior circulation: Both internal carotid arteries are patent to the termini, without significant stenosis. A1 segments patent. Normal anterior communicating artery. Anterior cerebral arteries are patent to their distal aspects. No M1 stenosis or occlusion. MCA branches perfused and symmetric. Posterior circulation: Vertebral arteries patent to the vertebrobasilar junction without stenosis. Basilar patent to its distal aspect. Superior cerebellar arteries patent proximally. Patent P1 segments. PCAs perfused to their distal aspects without stenosis. The right posterior communicating artery is patent. Venous sinuses: As permitted by contrast timing, patent. Anatomic variants: None significant. Review of the MIP images confirms the above findings IMPRESSION: 1. Diminishing opacification of the left V2 segment along its course, concerning for thrombosis, favored to be acute. Reconstitution in the left V3 segment, likely due to collaterals. 2. No acute intracranial process. 3. No intracranial large vessel occlusion or significant stenosis. 4. No hemodynamically significant stenosis in the neck. 5. Hypoenhancing lesions in the right thyroid lobe, which measure up to 1.5 cm. If this has not  previously been evaluated, a non-emergent ultrasound of the thyroid is recommended. (Reference: J Am Coll Radiol. 2015 Feb;12(2): 143-50) 6. Aortic atherosclerosis. Aortic Atherosclerosis (ICD10-I70.0). These results were called by telephone at the time of interpretation on 07/21/2022 at 10:03 pm to provider DAN FLOYD , who verbally acknowledged these results. Electronically Signed   By: Merilyn Baba M.D.   On: 07/21/2022 22:08     PERTINENT LAB RESULTS: CBC: Recent Labs    07/22/22 0748 07/23/22 0316  WBC 6.0 5.8  HGB  13.7 13.2  HCT 41.4 40.8  PLT 200 194   CMET CMP     Component Value Date/Time   NA 143 07/21/2022 2050   K 3.7 07/21/2022 2050   CL 105 07/21/2022 2050   CO2 28 07/21/2022 2050   GLUCOSE 152 (H) 07/21/2022 2050   BUN 17 07/21/2022 2050   CREATININE 0.61 07/21/2022 2050   CALCIUM 10.0 07/21/2022 2050   PROT 7.0 07/21/2022 2050   ALBUMIN 4.7 07/21/2022 2050   AST 18 07/21/2022 2050   ALT 23 07/21/2022 2050   ALKPHOS 90 07/21/2022 2050   BILITOT 0.3 07/21/2022 2050   GFRNONAA >60 07/21/2022 2050   GFRAA >60 04/29/2019 0249    GFR Estimated Creatinine Clearance: 50.4 mL/min (by C-G formula based on SCr of 0.61 mg/dL). No results for input(s): "LIPASE", "AMYLASE" in the last 72 hours. No results for input(s): "CKTOTAL", "CKMB", "CKMBINDEX", "TROPONINI" in the last 72 hours. Invalid input(s): "POCBNP" No results for input(s): "DDIMER" in the last 72 hours. Recent Labs    07/22/22 1229  HGBA1C 5.3   Recent Labs    07/23/22 0316  CHOL 183  HDL 76  LDLCALC 82  TRIG 126  CHOLHDL 2.4   Recent Labs    07/22/22 1229  TSH 2.208   No results for input(s): "VITAMINB12", "FOLATE", "FERRITIN", "TIBC", "IRON", "RETICCTPCT" in the last 72 hours. Coags: No results for input(s): "INR" in the last 72 hours.  Invalid input(s): "PT" Microbiology: No results found for this or any previous visit (from the past 240 hour(s)).  FURTHER DISCHARGE INSTRUCTIONS:  Get Medicines reviewed and adjusted: Please take all your medications with you for your next visit with your Primary MD  Laboratory/radiological data: Please request your Primary MD to go over all hospital tests and procedure/radiological results at the follow up, please ask your Primary MD to get all Hospital records sent to his/her office.  In some cases, they will be blood work, cultures and biopsy results pending at the time of your discharge. Please request that your primary care M.D. goes through all the records of  your hospital data and follows up on these results.  Also Note the following: If you experience worsening of your admission symptoms, develop shortness of breath, life threatening emergency, suicidal or homicidal thoughts you must seek medical attention immediately by calling 911 or calling your MD immediately  if symptoms less severe.  You must read complete instructions/literature along with all the possible adverse reactions/side effects for all the Medicines you take and that have been prescribed to you. Take any new Medicines after you have completely understood and accpet all the possible adverse reactions/side effects.   Do not drive when taking Pain medications or sleeping medications (Benzodaizepines)  Do not take more than prescribed Pain, Sleep and Anxiety Medications. It is not advisable to combine anxiety,sleep and pain medications without talking with your primary care practitioner  Special Instructions: If you have smoked or chewed Tobacco  in the  last 2 yrs please stop smoking, stop any regular Alcohol  and or any Recreational drug use.  Wear Seat belts while driving.  Please note: You were cared for by a hospitalist during your hospital stay. Once you are discharged, your primary care physician will handle any further medical issues. Please note that NO REFILLS for any discharge medications will be authorized once you are discharged, as it is imperative that you return to your primary care physician (or establish a relationship with a primary care physician if you do not have one) for your post hospital discharge needs so that they can reassess your need for medications and monitor your lab values.  Total Time spent coordinating discharge including counseling, education and face to face time equals greater than 30 minutes.  Signed: Tregan Read 07/23/2022 2:12 PM

## 2022-07-23 NOTE — Progress Notes (Signed)
ANTICOAGULATION CONSULT NOTE - Follow Up Consult  Pharmacy Consult for heparin Indication:  vertebral thrombus  Labs: Recent Labs    07/21/22 2050 07/21/22 2050 07/22/22 0748 07/22/22 1744 07/23/22 0316 07/23/22 1028  HGB 14.4  --  13.7  --  13.2  --   HCT 44.0  --  41.4  --  40.8  --   PLT 221  --  200  --  194  --   HEPARINUNFRC  --    < > 0.45 0.28* 0.42 0.43  CREATININE 0.61  --   --   --   --   --    < > = values in this interval not displayed.     Assessment/Plan:  87yo female therapeutic on heparin after rate change. Will continue infusion at current rate of 900 units/hr and monitor with daily AM labs  Vaughan Basta BS, PharmD, BCPS Clinical Pharmacist 07/23/2022 12:18 PM  Contact: [phone] after 3 PM  "Be curious, not judgmental..." -Jamal Maes

## 2022-07-23 NOTE — Care Management (Signed)
  Transition of Care [facility]) Screening Note   Patient Details  Name: Melissa Love Date of Birth: Dec 09, 1935   Transition of Care [facility]) CM/SW Contact:    Levonne Lapping, RN Phone Number: 07/23/2022, 10:28 AM    Transition of Care Department [facility]) has reviewed patient chart and no TOC needs have been identified at this time. We will continue to monitor patient advancement through interdisciplinary progression rounds. If new patient transition needs arise, please place a TOC consult.

## 2022-07-23 NOTE — Progress Notes (Signed)
  Echocardiogram 2D Echocardiogram has been performed.  Melissa Love 07/23/2022, 9:05 AM

## 2022-07-23 NOTE — Progress Notes (Signed)
Mobility Specialist Progress Note   07/23/22 1430  Mobility  Activity Ambulated with assistance in hallway  Level of Assistance Modified independent, requires aide device or extra time  Assistive Device Other (Comment) (HHA)  Distance Ambulated (ft) 200 ft  Activity Response Tolerated well  Mobility Referral Yes  $Mobility charge 1 Mobility   Patient received in chair agreeable to participate in mobility. Ambulated in hallway w/ a HHA having a steady gait. Returned to room without complaint or incident. Was left back in chair with all needs met, call bell in reach.   Holland Falling Mobility Specialist Please contact via SecureChat or  Rehab office at [phone]

## 2022-07-23 NOTE — Progress Notes (Signed)
ANTICOAGULATION CONSULT NOTE - Follow Up Consult  Pharmacy Consult for heparin Indication:  vertebral thrombus  Labs: Recent Labs    07/21/22 2050 07/22/22 0748 07/22/22 1744 07/23/22 0316  HGB 14.4 13.7  --  13.2  HCT 44.0 41.4  --  40.8  PLT 221 200  --  194  HEPARINUNFRC  --  0.45 0.28* 0.42  CREATININE 0.61  --   --   --     Assessment/Plan:  87yo female therapeutic on heparin after rate change. Will continue infusion at current rate of 900 units/hr and confirm stable with additional level.   Wynona Neat, PharmD, BCPS  07/23/2022,3:52 AM

## 2022-07-23 NOTE — Progress Notes (Addendum)
STROKE TEAM PROGRESS NOTE   SUBJECTIVE (INTERVAL HISTORY) Her daughter and son in law are at the bedside.  Overall her condition is completely resolved. She stated that she was opening fridge yesterday and had sudden onset b/l vision loss, seeing dark for 20-30 sec and then vision quickly recovered. During that time, he had to hold onto things until she sat down in the chair and called somebody. She has left eye macular degeneration and lost cental vision but peripheral vision was OK. Yesterday she even lost peripheral vision and b/l eyes were dark.   She had hx of migraine with visual aura with flashing lights and moving halos, but not vision loss. She has migraine once a month but HA resolves relatively quick. However, for the last two weeks, she did have HA with left neck and shoulder pain. She had used massage device from her friend and worked hard on the left shoulder neck for the last week.   CTA showed left VA thrombus. I discussed with Dr. Nevada Crane radiology and not convinced for thrombus but left VA dose have severe stenosis vs. Hypoplastic. Will switch heparin IV to DAPT.    OBJECTIVE Temp:  [97.6 F (36.4 C)-98.2 F (36.8 C)] 97.6 F (36.4 C) (02/20 1100) Pulse Rate:  [75-82] 82 (02/20 1100) Cardiac Rhythm: Normal sinus rhythm (02/20 0700) Resp:  [13-21] 13 (02/20 0800) BP: (146-171)/(69-79) 146/71 (02/20 1100) SpO2:  [100 %] 100 % (02/20 1100)  Recent Labs  Lab 07/23/22 0702  GLUCAP 94   Recent Labs  Lab 07/21/22 2050  NA 143  K 3.7  CL 105  CO2 28  GLUCOSE 152*  BUN 17  CREATININE 0.61  CALCIUM 10.0   Recent Labs  Lab 07/21/22 2050  AST 18  ALT 23  ALKPHOS 90  BILITOT 0.3  PROT 7.0  ALBUMIN 4.7   Recent Labs  Lab 07/21/22 2050 07/22/22 0748 07/23/22 0316  WBC 5.9 6.0 5.8  NEUTROABS 3.3  --   --   HGB 14.4 13.7 13.2  HCT 44.0 41.4 40.8  MCV 94.2 92.4 95.1  PLT 221 200 194   No results for input(s): "CKTOTAL", "CKMB", "CKMBINDEX", "TROPONINI" in the  last 168 hours. No results for input(s): "LABPROT", "INR" in the last 72 hours. No results for input(s): "COLORURINE", "LABSPEC", "PHURINE", "GLUCOSEU", "HGBUR", "BILIRUBINUR", "KETONESUR", "PROTEINUR", "UROBILINOGEN", "NITRITE", "LEUKOCYTESUR" in the last 72 hours.  Invalid input(s): "APPERANCEUR"     Component Value Date/Time   CHOL 183 07/23/2022 0316   TRIG 126 07/23/2022 0316   HDL 76 07/23/2022 0316   CHOLHDL 2.4 07/23/2022 0316   VLDL 25 07/23/2022 0316   LDLCALC 82 07/23/2022 0316   Lab Results  Component Value Date   HGBA1C 5.3 07/22/2022   No results found for: "LABOPIA", "COCAINSCRNUR", "LABBENZ", "AMPHETMU", "THCU", "LABBARB"  No results for input(s): "ETH" in the last 168 hours.  I have personally reviewed the radiological images below and agree with the radiology interpretations.  ECHOCARDIOGRAM COMPLETE  Result Date: 07/23/2022    ECHOCARDIOGRAM REPORT   Patient Name:   KIARALEE VALDIVIEZO Date of Exam: 07/23/2022 Medical Rec #:  [id number]       Height:       63.0 in Accession #:    [id number]      Weight:       163.1 lb Date of Birth:  01/03/1936       BSA:          1.773 m Patient Age:  86 years        BP:           171/79 mmHg Patient Gender: F               HR:           72 bpm. Exam Location:  Inpatient Procedure: 2D Echo, Color Doppler, Cardiac Doppler and Intracardiac            Opacification Agent Indications:    TIA  History:        Patient has no prior history of Echocardiogram examinations.                 Risk Factors:Hypertension.  Sonographer:    Eartha Inch Referring Phys: [id number] [place]  Sonographer Comments: Technically difficult study due to poor echo windows. Image acquisition challenging due to patient body habitus and Image acquisition challenging due to respiratory motion. IMPRESSIONS  1. LV function best assessed on contrast views (87). Left ventricular ejection fraction, by estimation, is 65 to 70%. The left ventricle has normal function.  The left ventricle has no regional wall motion abnormalities. Left ventricular diastolic parameters are consistent with Grade I diastolic dysfunction (impaired relaxation).  2. Right ventricular systolic function is normal. The right ventricular size is normal.  3. The mitral valve is grossly normal. No evidence of mitral valve regurgitation.  4. Mild aortic regurgitation, with vena contracta 2.5 mm. The aortic valve was not well visualized. Aortic valve regurgitation is mild. Aortic regurgitation PHT measures 256 msec.  5. The inferior vena cava is normal in size with greater than 50% respiratory variability, suggesting right atrial pressure of 3 mmHg. Comparison(s): No prior Echocardiogram. FINDINGS  Left Ventricle: LV function best assessed on contrast views (87). Left ventricular ejection fraction, by estimation, is 65 to 70%. The left ventricle has normal function. The left ventricle has no regional wall motion abnormalities. Definity contrast agent was given IV to delineate the left ventricular endocardial borders. The left ventricular internal cavity size was normal in size. There is no left ventricular hypertrophy. Left ventricular diastolic parameters are consistent with Grade I diastolic dysfunction (impaired relaxation). Right Ventricle: The right ventricular size is normal. No increase in right ventricular wall thickness. Right ventricular systolic function is normal. Left Atrium: Left atrial size was normal in size. Right Atrium: Right atrial size was normal in size. Pericardium: There is no evidence of pericardial effusion. Presence of epicardial fat layer. Mitral Valve: The mitral valve is grossly normal. Mild mitral annular calcification. No evidence of mitral valve regurgitation. Tricuspid Valve: The tricuspid valve is not well visualized. Tricuspid valve regurgitation is not demonstrated. No evidence of tricuspid stenosis. Aortic Valve: Mild aortic regurgitation, with vena contracta 2.5 mm. The  aortic valve was not well visualized. Aortic valve regurgitation is mild. Aortic regurgitation PHT measures 256 msec. Pulmonic Valve: The pulmonic valve was not well visualized. Pulmonic valve regurgitation is not visualized. No evidence of pulmonic stenosis. Aorta: The aortic root is normal in size and structure. Venous: The inferior vena cava is normal in size with greater than 50% respiratory variability, suggesting right atrial pressure of 3 mmHg. IAS/Shunts: No atrial level shunt detected by color flow Doppler.  LEFT VENTRICLE PLAX 2D LVIDd:         4.50 cm   Diastology LVIDs:         3.90 cm   LV e' medial:    4.35 cm/s LV PW:  0.80 cm   LV E/e' medial:  15.0 LV IVS:        0.80 cm   LV e' lateral:   6.96 cm/s LVOT diam:     2.00 cm   LV E/e' lateral: 9.4 LV SV:         47 LV SV Index:   27 LVOT Area:     3.14 cm  RIGHT VENTRICLE RV S prime:     12.30 cm/s TAPSE (M-mode): 1.7 cm LEFT ATRIUM             Index        RIGHT ATRIUM           Index LA diam:        2.60 cm 1.47 cm/m   RA Area:     12.60 cm LA Vol (A2C):   34.3 ml 19.34 ml/m  RA Volume:   27.80 ml  15.68 ml/m LA Vol (A4C):   24.0 ml 13.53 ml/m LA Biplane Vol: 29.8 ml 16.81 ml/m  AORTIC VALVE LVOT Vmax:   75.80 cm/s LVOT Vmean:  52.300 cm/s LVOT VTI:    0.151 m AI PHT:      256 msec  AORTA Ao Root diam: 3.00 cm MITRAL VALVE MV Area (PHT): 2.83 cm    SHUNTS MV Decel Time: 268 msec    Systemic VTI:  0.15 m MV E velocity: 65.10 cm/s  Systemic Diam: 2.00 cm MV A velocity: 77.10 cm/s MV E/A ratio:  0.84 Rudean Haskell MD Electronically signed by Rudean Haskell MD Signature Date/Time: 07/23/2022/9:45:13 AM    Final    MR BRAIN WO CONTRAST  Result Date: 07/22/2022 CLINICAL DATA:  Neuro deficit, acute, stroke suspected EXAM: MRI HEAD WITHOUT CONTRAST TECHNIQUE: Multiplanar, multiecho pulse sequences of the brain and surrounding structures were obtained without intravenous contrast. COMPARISON:  CTA head/neck from July 21, 2022. FINDINGS: Brain: No acute infarction, hemorrhage, hydrocephalus, extra-axial collection or mass lesion. Mild for age chronic microvascular ischemic change. Vascular: Major arterial flow voids are maintained at the skull base. Skull and upper cervical spine: Normal marrow signal. Sinuses/Orbits: Clear sinuses.  No acute orbital findings. Other: No mastoid effusions.  Head IMPRESSION: No evidence of acute intracranial abnormality. Electronically Signed   By: Margaretha Sheffield M.D.   On: 07/22/2022 17:35   CT ANGIO HEAD NECK W WO CM  Result Date: 07/21/2022 CLINICAL DATA:  Hypertension, blurred vision, weakness EXAM: CT ANGIOGRAPHY HEAD AND NECK TECHNIQUE: Multidetector CT imaging of the head and neck was performed using the standard protocol during bolus administration of intravenous contrast. Multiplanar CT image reconstructions and MIPs were obtained to evaluate the vascular anatomy. Carotid stenosis measurements (when applicable) are obtained utilizing NASCET criteria, using the distal internal carotid diameter as the denominator. RADIATION DOSE REDUCTION: This exam was performed according to the departmental dose-optimization program which includes automated exposure control, adjustment of the mA and/or kV according to patient size and/or use of iterative reconstruction technique. CONTRAST:  13m OMNIPAQUE IOHEXOL 350 MG/ML SOLN COMPARISON:  No prior CTA available, correlation is made with CT head 03/22/2006 FINDINGS: CT HEAD FINDINGS Brain: No evidence of acute infarct, hemorrhage, mass, mass effect, or midline shift. No hydrocephalus or extra-axial fluid collection. Normal cerebral volume for age. Vascular: No hyperdense vessel. Skull: Negative for fracture or focal lesion. Sinuses/Orbits: No acute finding. Status post bilateral lens replacements. Other: The mastoid air cells are well aerated. CTA NECK FINDINGS Aortic arch: Standard branching. Imaged portion shows no evidence of  aneurysm or dissection.  No significant stenosis of the major arch vessel origins. Aortic atherosclerosis. Right carotid system: No evidence of dissection, occlusion, or hemodynamically significant stenosis (greater than 50%). Left carotid system: No evidence of dissection, occlusion, or hemodynamically significant stenosis (greater than 50%). Vertebral arteries: The right vertebral artery is patent from its origin to the skull base, without significant stenosis, dissection, or occlusion. The left vertebral artery is patent at its origin and in the left V1 segment. Diminishing opacification of the left V2 segment (series 10, image 238), and severe stenosis in the proximal V3 segment (series 10, image 178), with improved opacification and caliber in the distal left V3 segment, likely via collaterals. No evidence of dissection the left vertebral artery. Skeleton: No acute osseous abnormality. Degenerative changes in the cervical spine. Other neck: Hypoenhancing lesions. No otherwise negative. In the right thyroid lobe, which measure up to 1.5 cm Upper chest: No focal pulmonary opacity or pleural effusion. Review of the MIP images confirms the above findings CTA HEAD FINDINGS Anterior circulation: Both internal carotid arteries are patent to the termini, without significant stenosis. A1 segments patent. Normal anterior communicating artery. Anterior cerebral arteries are patent to their distal aspects. No M1 stenosis or occlusion. MCA branches perfused and symmetric. Posterior circulation: Vertebral arteries patent to the vertebrobasilar junction without stenosis. Basilar patent to its distal aspect. Superior cerebellar arteries patent proximally. Patent P1 segments. PCAs perfused to their distal aspects without stenosis. The right posterior communicating artery is patent. Venous sinuses: As permitted by contrast timing, patent. Anatomic variants: None significant. Review of the MIP images confirms the above findings IMPRESSION: 1. Diminishing  opacification of the left V2 segment along its course, concerning for thrombosis, favored to be acute. Reconstitution in the left V3 segment, likely due to collaterals. 2. No acute intracranial process. 3. No intracranial large vessel occlusion or significant stenosis. 4. No hemodynamically significant stenosis in the neck. 5. Hypoenhancing lesions in the right thyroid lobe, which measure up to 1.5 cm. If this has not previously been evaluated, a non-emergent ultrasound of the thyroid is recommended. (Reference: J Am Coll Radiol. 2015 Feb;12(2): 143-50) 6. Aortic atherosclerosis. Aortic Atherosclerosis (ICD10-I70.0). These results were called by telephone at the time of interpretation on 07/21/2022 at 10:03 pm to provider DAN FLOYD , who verbally acknowledged these results. Electronically Signed   By: Merilyn Baba M.D.   On: 07/21/2022 22:08     PHYSICAL EXAM  Temp:  [97.6 F (36.4 C)-98.2 F (36.8 C)] 97.6 F (36.4 C) (02/20 1100) Pulse Rate:  [75-82] 82 (02/20 1100) Resp:  [13-21] 13 (02/20 0800) BP: (146-171)/(69-79) 146/71 (02/20 1100) SpO2:  [100 %] 100 % (02/20 1100)  General - Well nourished, well developed, in no apparent distress.  Ophthalmologic - fundi not visualized due to noncooperation.  Cardiovascular - Regular rhythm and rate.  Mental Status -  Level of arousal and orientation to time, place, and person were intact. Language including expression, naming, repetition, comprehension was assessed and found intact. Attention span and concentration were normal. Fund of Knowledge was assessed and was intact.  Cranial Nerves II - XII - II - Visual field intact OU. III, IV, VI - Extraocular movements intact. V - Facial sensation intact bilaterally. VII - Facial movement intact bilaterally. VIII - Hearing & vestibular intact bilaterally. X - Palate elevates symmetrically. XI - Chin turning & shoulder shrug intact bilaterally. XII - Tongue protrusion intact.  Motor Strength -  The patient's strength was normal in all extremities  and pronator drift was absent.  Bulk was normal and fasciculations were absent.   Motor Tone - Muscle tone was assessed at the neck and appendages and was normal.  Reflexes - The patient's reflexes were symmetrical in all extremities and she had no pathological reflexes.  Sensory - Light touch, temperature/pinprick were assessed and were symmetrical.    Coordination - The patient had normal movements in the hands and feet with no ataxia or dysmetria.  Tremor was absent.  Gait and Station - deferred.   ASSESSMENT/PLAN Ms. MADDIGAN LEGLEITER is a 87 y.o. female with history of HTN, left eye macular degeneration, SVT, chronic UTI admitted for brief b/l vision loss. No tPA given due to symptoms resolved.    B/l transient vision loss, ? Migraine with visual aura Not a typical symptoms for stroke CT no acute finding MRI no infarct  DDx including migraine, syncope, seizure or b/l papilledema  Pt has hx of migraine with visual aura including flashing lights and moving halos, but no vision loss in the past. Migraine average once a month but HA resolves not too long. However, for the last two weeks, she did have HA with left neck and shoulder pain.  Recommend to follow up with Dr Jaynee [facility] at [facility].   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 Left V2 thrombus, I discussed with Dr. Nevada Crane Radiology, not convinced for V2 thrombus but positive for left V2 high grade stenosis, dissection vs. hypoplastic MRI  no acute infarct 2D Echo  EF 65-70% LDL 82 HgbA1c 5.3 Heparin IV for VTE prophylaxis No antithrombotic prior to admission, now on heparin IV. Will transition to DAPT for 3 months and then ASA alone Patient counseled to be compliant with her antithrombotic medications Ongoing aggressive stroke risk factor management Therapy recommendations:  none Disposition:  home  Hypertension Fluctuate  Gradually normalize BP in 2-3  days Long term BP goal normotensive   Hyperlipidemia Home meds:  zocor 20  LDL 82, goal < 70 Now on lipitor 40 Continue statin at discharge  SVT Hx of palpitation 14 day monitoring - 44 runs of SVT Followed with Dr. Bettina Gavia as outpt No more episodes.   Other Stroke Risk Factors Advanced age  Other Active Problems Left eye macular degeneration  Hospital day # 0  Neurology will sign off. Please call with questions. Pt will follow up with stroke clinic Dr Jaynee Eagles at [facility] in about 4 weeks. Thanks for the consult.   Rosalin Hawking, MD PhD Stroke Neurology 07/23/2022 1:48 PM    To contact Stroke Continuity provider, please refer to [email or web address] After hours, contact General Neurology

## 2022-07-24 ENCOUNTER — Other Ambulatory Visit (HOSPITAL_COMMUNITY): Payer: Self-pay

## 2022-07-25 ENCOUNTER — Telehealth: Payer: Self-pay | Admitting: Cardiology

## 2022-07-25 NOTE — Telephone Encounter (Signed)
Called patient and she reported that she was discharged from [facility] on 07/24/22 and she wanted to schedule an appointment to follow up with Dr. Bettina Gavia. An appointment was scheduled for the patient on 08/02/22 at 8:00 am to see Dr. Bettina Gavia. Patient had no further questions at this time.

## 2022-07-25 NOTE — Telephone Encounter (Signed)
Patient stated she wants hospital f/u directly with Dr. Bettina Gavia.  Patient stated she has medication changes and her radiologist noted she has a clot in her subclavian artery(?).

## 2022-07-30 DIAGNOSIS — Z8673 Personal history of transient ischemic attack (TIA), and cerebral infarction without residual deficits: Secondary | ICD-10-CM | POA: Diagnosis not present

## 2022-07-30 DIAGNOSIS — Z09 Encounter for follow-up examination after completed treatment for conditions other than malignant neoplasm: Secondary | ICD-10-CM | POA: Diagnosis not present

## 2022-07-30 DIAGNOSIS — D692 Other nonthrombocytopenic purpura: Secondary | ICD-10-CM | POA: Diagnosis not present

## 2022-07-30 DIAGNOSIS — E041 Nontoxic single thyroid nodule: Secondary | ICD-10-CM | POA: Diagnosis not present

## 2022-07-30 DIAGNOSIS — I700 Atherosclerosis of aorta: Secondary | ICD-10-CM | POA: Diagnosis not present

## 2022-07-30 DIAGNOSIS — Z6830 Body mass index (BMI) 30.0-30.9, adult: Secondary | ICD-10-CM | POA: Diagnosis not present

## 2022-07-30 DIAGNOSIS — E538 Deficiency of other specified B group vitamins: Secondary | ICD-10-CM | POA: Diagnosis not present

## 2022-07-30 DIAGNOSIS — R040 Epistaxis: Secondary | ICD-10-CM | POA: Diagnosis not present
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
## 2022-08-02 ENCOUNTER — Encounter: Payer: Self-pay | Admitting: Cardiology

## 2022-08-02 ENCOUNTER — Ambulatory Visit: Payer: Medicare Other | Attending: Cardiology | Admitting: Cardiology

## 2022-08-02 VITALS — BP 140/70 | HR 64 | Ht 63.0 in | Wt 167.0 lb

## 2022-08-02 DIAGNOSIS — E782 Mixed hyperlipidemia: Secondary | ICD-10-CM | POA: Insufficient documentation

## 2022-08-02 DIAGNOSIS — I10 Essential (primary) hypertension: Secondary | ICD-10-CM | POA: Diagnosis not present

## 2022-08-02 DIAGNOSIS — I4719 Other supraventricular tachycardia: Secondary | ICD-10-CM | POA: Diagnosis not present

## 2022-08-02 DIAGNOSIS — H543 Unqualified visual loss, both eyes: Secondary | ICD-10-CM | POA: Insufficient documentation

## 2022-08-02 DIAGNOSIS — I480 Paroxysmal atrial fibrillation: Secondary | ICD-10-CM | POA: Diagnosis not present

## 2022-08-02 MED ORDER — METOPROLOL SUCCINATE ER 25 MG PO TB24: 25.0000 mg | ORAL_TABLET | Freq: Every day | ORAL | 3 refills | Status: DC

## 2022-08-02 NOTE — Patient Instructions (Signed)
Medication Instructions:  Your physician has recommended you make the following change in your medication:   STOP: Acebutolol START: Toprol XL 25 mg daily  *If you need a refill on your cardiac medications before your next appointment, please call your pharmacy*   Lab Work: None If you have labs (blood work) drawn today and your tests are completely normal, you will receive your results only by: [place] (if you have MyChart) OR A paper copy in the mail If you have any lab test that is abnormal or we need to change your treatment, we will call you to review the results.   Testing/Procedures: Your physician has recommended that you wear an event monitor. Event monitors are medical devices that record the heart's electrical activity. Doctors most often [place] these monitors to diagnose arrhythmias. Arrhythmias are problems with the speed or rhythm of the heartbeat. The monitor is a small, portable device. You can wear one while you do your normal daily activities. This is usually used to diagnose what is causing palpitations/syncope (passing out).    Follow-Up: At [facility], you and your health needs are our priority.  As part of our continuing mission to provide you with exceptional heart care, we have created designated Provider Care Teams.  These Care Teams include your primary Cardiologist (physician) and Advanced Practice Providers (APPs -  Physician Assistants and Nurse Practitioners) who all work together to provide you with the care you need, when you need it.  We recommend signing up for the patient portal called "MyChart".  Sign up information is provided on this After Visit Summary.  MyChart is used to connect with patients for Virtual Visits (Telemedicine).  Patients are able to view lab/test results, encounter notes, upcoming appointments, etc.  Non-urgent messages can be sent to your provider as well.   To learn more about what you can do with MyChart, go to  [email or web address].    Your next appointment:   3 month(s)  Provider:   Shirlee More, MD    Other Instructions Get a Fitbit or smart watch.

## 2022-08-03 ENCOUNTER — Ambulatory Visit: Payer: Medicare Other | Attending: Cardiology

## 2022-08-03 DIAGNOSIS — I4719 Other supraventricular tachycardia: Secondary | ICD-10-CM | POA: Diagnosis not present

## 2022-08-03 DIAGNOSIS — E782 Mixed hyperlipidemia: Secondary | ICD-10-CM | POA: Diagnosis not present

## 2022-08-03 DIAGNOSIS — I10 Essential (primary) hypertension: Secondary | ICD-10-CM | POA: Insufficient documentation

## 2022-08-03 DIAGNOSIS — H543 Unqualified visual loss, both eyes: Secondary | ICD-10-CM

## 2022-08-08 ENCOUNTER — Telehealth: Payer: Self-pay | Admitting: Cardiology

## 2022-08-08 DIAGNOSIS — E041 Nontoxic single thyroid nodule: Secondary | ICD-10-CM | POA: Diagnosis not present

## 2022-08-08 NOTE — Telephone Encounter (Signed)
Called patient and she reported that the phone part of her 30 day event monitor said that action was required after she changed the battery part of her heart monitor. I asked her to come into the office so I could evaluate the heart monitor. Patient was agreeable with this plan and stated that she would come by the office after her ultrasound at [facility] which is at 2 pm. Patient had no further questions at this time.

## 2022-08-08 NOTE — Telephone Encounter (Signed)
Pt stated that they were having issues with the heart monitor battery dying. Pt stated that the battery stated it was at 25%, so the pt tried to used the spare battery. When using the spare battery, the heart monitor said action required. Pt was wanting to know if they could come by this afternoon to have some help adjusting the heart monitor for her. Pt did request to hear back before 12 PM, pt has an appointment this afternoon to have an ultrasound and doesn't want to miss the call.

## 2022-08-29 DIAGNOSIS — H353221 Exudative age-related macular degeneration, left eye, with active choroidal neovascularization: Secondary | ICD-10-CM | POA: Diagnosis not present

## 2022-09-18 DIAGNOSIS — Z6830 Body mass index (BMI) 30.0-30.9, adult: Secondary | ICD-10-CM | POA: Diagnosis not present

## 2022-09-18 DIAGNOSIS — G500 Trigeminal neuralgia: Secondary | ICD-10-CM | POA: Diagnosis not present

## 2022-09-24 DIAGNOSIS — M5136 Other intervertebral disc degeneration, lumbar region: Secondary | ICD-10-CM | POA: Diagnosis not present

## 2022-09-24 DIAGNOSIS — M25552 Pain in left hip: Secondary | ICD-10-CM | POA: Diagnosis not present

## 2022-09-30 ENCOUNTER — Encounter: Payer: Self-pay | Admitting: Neurology

## 2022-09-30 ENCOUNTER — Ambulatory Visit (INDEPENDENT_AMBULATORY_CARE_PROVIDER_SITE_OTHER): Payer: Medicare Other | Admitting: Neurology

## 2022-09-30 VITALS — BP 149/81 | HR 69 | Ht 62.0 in | Wt 167.8 lb

## 2022-09-30 DIAGNOSIS — I6509 Occlusion and stenosis of unspecified vertebral artery: Secondary | ICD-10-CM | POA: Diagnosis not present

## 2022-09-30 DIAGNOSIS — I7774 Dissection of vertebral artery: Secondary | ICD-10-CM | POA: Diagnosis not present

## 2022-09-30 DIAGNOSIS — H5462 Unqualified visual loss, left eye, normal vision right eye: Secondary | ICD-10-CM | POA: Diagnosis not present

## 2022-09-30 NOTE — Patient Instructions (Addendum)
Dr. Roda Shutters - will talk; Will ask about need to repeat CTA to look at the vertebral artery; Will let you know Stop Plavix end of May and continue Asa 81mg   Temporal Arteritis  Temporal arteritis is a condition that makes your arteries become inflamed. It can happen in any part of the body. Most often, it affects your head and face. It is also called giant cell arteritis. This condition can cause severe problems such as blindness. Early treatment can help prevent those problems. What are the causes? The cause of this condition is not known. What increases the risk? You may be more likely to get this condition if: You are older than 85 years of age. You are female. You are Caucasian. You are of [place], [place], [place], [place], or [place] ancestry. You have a family history of the condition. You have polymyalgia rheumatica (PMR). This is a condition that causes muscle pain and stiffness. What are the signs or symptoms? Some people with this condition have just one symptom. Others have more than one symptom. Most symptoms are related to the head and face. These may include: Headache. Hard, swollen, or tender temples. Your temples are the areas on either side of your forehead. Pain when you comb your hair or when you lay your head down. Pain in your jaw when you chew. Pain in your throat or tongue. Problems with your vision. These may include sudden loss of vision in one eye or seeing double. Other symptoms may include: Fever. Feeling tired (fatigue). A dry cough. Pain in your hips and shoulders. Pain in your arms when you exercise. Depression. Weight loss. How is this diagnosed? This condition may be diagnosed based on your symptoms, your medical history, and a physical exam. You may also have tests done. These may include: Blood tests. Biopsy. This is when a small piece of tissue is removed for testing. Imaging tests, such as an ultrasound or MRI. How is this treated? This  condition may be treated with medicines to: Reduce inflammation (steroids). Weaken your immune system (immunosuppressants). Your immune system is your body's defense system. Treat vision problems. Follow these instructions at home: Medicines Take over-the-counter and prescription medicines only as told by your health care provider. Take any vitamins or supplements that your provider recommends. These may include vitamin D and calcium to help stop your bones from getting weak. Eating and drinking  Eat foods that are good for your heart. These may include: Foods high in fiber, such as fresh fruits and vegetables, whole grains, and beans. Foods that have healthy fats in them (omega-3 fats), such as fish, flaxseed, and flaxseed oil. Limit foods that are high in saturated fat and cholesterol. These include processed and fried foods, fatty meat, and full-fat dairy. Limit how much salt (sodium) you eat. Include calcium and vitamin D in your diet. Good sources of these include: Low-fat dairy products, such as milk, yogurt, and cheese. Certain fish, such as fresh or canned salmon, tuna, and sardines. Fortified products. This means that they have calcium and vitamin D added to them. These include fortified cereals and juice. General instructions Exercise. Talk with your provider about what exercises are safe for you. You may be told to do exercises that make your heart beat faster (aerobic exercise). These can help control your blood pressure and prevent bone loss. Stay up-to-date on all of your vaccines as told by your provider. Keep all follow-up visits. The medicines used to treat this condition can make you more likely  to have problems such as bone loss or diabetes. Your provider will check for problems during your follow-up visits. Contact a health care provider if: Your symptoms get worse. You have any signs of infection. These may include fever, swelling, redness, warmth, or tenderness. Get  help right away if: You lose your vision. Your pain does not go away, even after you take medicine. You have chest pain. You have trouble breathing. One side of your face or body gets weak or numb all of a sudden. These symptoms may be an emergency. Get help right away. Call 911. Do not wait to see if the symptoms will go away. Do not drive yourself to the hospital. This information is not intended to replace advice given to you by your health care provider. Make sure you discuss any questions you have with your health care provider. Document Revised: 03/04/2022 Document Reviewed: 03/04/2022 Elsevier Patient Education  2023 [employer]   Stroke Prevention Some medical conditions and behaviors can lead to a higher chance of having a stroke. You can help prevent a stroke by eating healthy, exercising, not smoking, and managing any medical conditions you have. Stroke is a leading cause of functional impairment. Primary prevention is particularly important because a majority of strokes are first-time events. Stroke changes the lives of not only those who experience a stroke but also their family and other caregivers. How can this condition affect me? A stroke is a medical emergency and should be treated right away. A stroke can lead to brain damage and can sometimes be life-threatening. If a person gets medical treatment right away, there is a better chance of surviving and recovering from a stroke. What can increase my risk? The following medical conditions may increase your risk of a stroke: Cardiovascular disease. High blood pressure (hypertension). Diabetes. High cholesterol. Sickle cell disease. Blood clotting disorders (hypercoagulable state). Obesity. Sleep disorders (obstructive sleep apnea). Other risk factors include: Being older than age 30. Having a history of blood clots, stroke, or mini-stroke (transient ischemic attack, TIA). Genetic factors, such as race, ethnicity, or  a family history of stroke. Smoking cigarettes or using other tobacco products. Taking birth control pills, especially if you also use tobacco. Heavy use of alcohol or drugs, especially cocaine and methamphetamine. Physical inactivity. What actions can I take to prevent this? Manage your health conditions High cholesterol levels. Eating a healthy diet is important for preventing high cholesterol. If cholesterol cannot be managed through diet alone, you may need to take medicines. Take any prescribed medicines to control your cholesterol as told by your health care provider. Hypertension. To reduce your risk of stroke, try to keep your blood pressure below 130/80. Eating a healthy diet and exercising regularly are important for controlling blood pressure. If these steps are not enough to manage your blood pressure, you may need to take medicines. Take any prescribed medicines to control hypertension as told by your health care provider. Ask your health care provider if you should monitor your blood pressure at home. Have your blood pressure checked every year, even if your blood pressure is normal. Blood pressure increases with age and some medical conditions. Diabetes. Eating a healthy diet and exercising regularly are important parts of managing your blood sugar (glucose). If your blood sugar cannot be managed through diet and exercise, you may need to take medicines. Take any prescribed medicines to control your diabetes as told by your health care provider. Get evaluated for obstructive sleep apnea. Talk to your health  care provider about getting a sleep evaluation if you snore a lot or have excessive sleepiness. Make sure that any other medical conditions you have, such as atrial fibrillation or atherosclerosis, are managed. Nutrition Follow instructions from your health care provider about what to eat or drink to help manage your health condition. These instructions may include: Reducing  your daily calorie intake. Limiting how much salt (sodium) you use to 1,500 milligrams (mg) each day. Using only healthy fats for cooking, such as olive oil, canola oil, or sunflower oil. Eating healthy foods. You can do this by: Choosing foods that are high in fiber, such as whole grains, and fresh fruits and vegetables. Eating at least 5 servings of fruits and vegetables a day. Try to fill one-half of your plate with fruits and vegetables at each meal. Choosing lean protein foods, such as lean cuts of meat, poultry without skin, fish, tofu, beans, and nuts. Eating low-fat dairy products. Avoiding foods that are high in sodium. This can help lower blood pressure. Avoiding foods that have saturated fat, trans fat, and cholesterol. This can help prevent high cholesterol. Avoiding processed and prepared foods. Counting your daily carbohydrate intake.  Lifestyle If you drink alcohol: Limit how much you have to: 0-1 drink a day for women who are not pregnant. 0-2 drinks a day for men. Know how much alcohol is in your drink. In the [place] one drink equals one 12 oz bottle of beer ( ), one 5 oz glass of wine ( ), or one 1 oz glass of hard liquor (44mL). Do not use any products that contain nicotine or tobacco. These products include cigarettes, chewing tobacco, and vaping devices, such as e-cigarettes. If you need help quitting, ask your health care provider. Avoid secondhand smoke. Do not use drugs. Activity  Try to stay at a healthy weight. Get at least 30 minutes of exercise on most days, such as: Fast walking. Biking. Swimming. Medicines Take over-the-counter and prescription medicines only as told by your health care provider. Aspirin or blood thinners (antiplatelets or anticoagulants) may be recommended to reduce your risk of forming blood clots that can lead to stroke. Avoid taking birth control pills. Talk to your health care provider about the risks of taking birth control  pills if: You are over 46 years old. You smoke. You get very bad headaches. You have had a blood clot. Where to find more information American Stroke Association: www.strokeassociation.org Get help right away if: You or a loved one has any symptoms of a stroke. "BE FAST" is an easy way to remember the main warning signs of a stroke: B - Balance. Signs are dizziness, sudden trouble walking, or loss of balance. E - Eyes. Signs are trouble seeing or a sudden change in vision. F - Face. Signs are sudden weakness or numbness of the face, or the face or eyelid drooping on one side. A - Arms. Signs are weakness or numbness in an arm. This happens suddenly and usually on one side of the body. S - Speech. Signs are sudden trouble speaking, slurred speech, or trouble understanding what people say. T - Time. Time to call emergency services. Write down what time symptoms started. You or a loved one has other signs of a stroke, such as: A sudden, severe headache with no known cause. Nausea or vomiting. Seizure. These symptoms may represent a serious problem that is an emergency. Do not wait to see if the symptoms will go away. Get medical help right away. Call your  local emergency services (911 in the [place] Do not drive yourself to the hospital. Summary You can help to prevent a stroke by eating healthy, exercising, not smoking, limiting alcohol intake, and managing any medical conditions you may have. Do not use any products that contain nicotine or tobacco. These include cigarettes, chewing tobacco, and vaping devices, such as e-cigarettes. If you need help quitting, ask your health care provider. Remember "BE FAST" for warning signs of a stroke. Get help right away if you or a loved one has any of these signs. This information is not intended to replace advice given to you by your health care provider. Make sure you discuss any questions you have with your health care provider. Document Revised:  12/02/2019 Document Reviewed: 12/20/2019 Elsevier Patient Education  2023 [employer].
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
## 2022-10-01 ENCOUNTER — Telehealth: Payer: Self-pay | Admitting: Neurology

## 2022-10-01 ENCOUNTER — Telehealth: Payer: Self-pay | Admitting: *Deleted

## 2022-10-01 ENCOUNTER — Encounter: Payer: Self-pay | Admitting: Neurology

## 2022-10-01 LAB — SEDIMENTATION RATE: Sed Rate: 2 mm/hr (ref 0–40)

## 2022-10-01 LAB — C-REACTIVE PROTEIN: CRP: <1 mg/L (ref 0–10)

## 2022-10-01 NOTE — Telephone Encounter (Signed)
Please let ms Hanssen know we will repeat CTA of the head and neck to see what the blood vessels look like and recheck for clot vs other thank you its been ordered

## 2022-10-01 NOTE — Telephone Encounter (Signed)
Tried to call patient, rang multiple times then received message that vm box is not accepting messages.

## 2022-10-01 NOTE — Telephone Encounter (Signed)
Spoke to patient gave labwork results . Pt expressed understanding and thanked me for calling  

## 2022-10-01 NOTE — Telephone Encounter (Signed)
-----   Message from Anson Fret, MD sent at 10/01/2022 12:25 PM EDT ----- Bloodwork is normal, there is no temporal arteritis but I had to check thanks

## 2022-10-01 NOTE — Telephone Encounter (Signed)
medicare/[place] sup NPR sent to [employer] [phone]

## 2022-10-07 NOTE — Telephone Encounter (Signed)
I called pt and let her know that Dr. Lucia Gaskins did order the CTA head and neck to recheck if clot or any other issues.  Pt said she was scheduled 10-30-2022.  I relayed that we had tried to reach her but not able to LM, this being 6 days ago.  She appreciated call back.  I would place date in my note.

## 2022-10-25 ENCOUNTER — Other Ambulatory Visit (HOSPITAL_COMMUNITY): Payer: Self-pay

## 2022-10-30 ENCOUNTER — Ambulatory Visit (HOSPITAL_COMMUNITY): Payer: Medicare Other

## 2022-10-30 ENCOUNTER — Ambulatory Visit (HOSPITAL_COMMUNITY)
Admission: RE | Admit: 2022-10-30 | Discharge: 2022-10-30 | Disposition: A | Discharge status: Home / Self Care | Payer: Medicare Other | Source: Ambulatory Visit | Attending: Neurology | Admitting: Neurology

## 2022-10-30 DIAGNOSIS — I7774 Dissection of vertebral artery: Secondary | ICD-10-CM | POA: Insufficient documentation

## 2022-10-30 DIAGNOSIS — H5462 Unqualified visual loss, left eye, normal vision right eye: Secondary | ICD-10-CM | POA: Diagnosis not present

## 2022-10-30 DIAGNOSIS — I6509 Occlusion and stenosis of unspecified vertebral artery: Secondary | ICD-10-CM | POA: Diagnosis not present

## 2022-10-30 DIAGNOSIS — I639 Cerebral infarction, unspecified: Secondary | ICD-10-CM | POA: Diagnosis not present

## 2022-10-30 MED ORDER — IOHEXOL 350 MG/ML SOLN
75.0000 mL | Freq: Once | INTRAVENOUS | Status: AC | PRN
  Administered 2022-10-30: 75 mL via INTRAVENOUS

## 2022-10-30 NOTE — Progress Notes (Signed)
Please call and inform patient that her angiogram did not show any large vessel occlusion. There were narrowing of some of the intracranial vasculature, unchanged from the previous study in February.  We have also seen a thyroid nodule, please follow up with your PCP if you have not done so previously.

## 2022-10-31 ENCOUNTER — Telehealth: Payer: Self-pay | Admitting: *Deleted

## 2022-10-31 NOTE — Telephone Encounter (Signed)
I spoke with the patient and discussed the results as noted below by Dr. Teresa Coombs. She verbalized understanding and her questions were answered.  The patient states she has previously had the thyroid nodule addressed and had an ultrasound but I encouraged her to double check with her primary care to make sure it has not enlarged and that she does not need any further action.  Patient states she will call primary care office and I sent the results over to them.  She appreciated my call.   Pt requested to have a copy of the CT angio head and neck mailed to her home. She confirmed that her address on file is correct.

## 2022-10-31 NOTE — Telephone Encounter (Signed)
-----   Message from Windell Norfolk, MD sent at 10/30/2022  5:08 PM EDT ----- Please call and inform patient that her angiogram did not show any large vessel occlusion. There were narrowing of some of the intracranial vasculature, unchanged from the previous study in February.  We have also seen a thyroid nodule, please follow up with your PCP if you have not done so previously.
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
## 2022-11-05 ENCOUNTER — Encounter: Payer: Self-pay | Admitting: Cardiology

## 2022-11-05 ENCOUNTER — Ambulatory Visit: Payer: Medicare Other | Attending: Cardiology | Admitting: Cardiology

## 2022-11-05 VITALS — BP 140/64 | HR 78 | Ht 62.0 in | Wt 168.0 lb

## 2022-11-05 DIAGNOSIS — E782 Mixed hyperlipidemia: Secondary | ICD-10-CM | POA: Diagnosis not present

## 2022-11-05 DIAGNOSIS — I4719 Other supraventricular tachycardia: Secondary | ICD-10-CM | POA: Diagnosis not present

## 2022-11-05 DIAGNOSIS — I10 Essential (primary) hypertension: Secondary | ICD-10-CM | POA: Diagnosis not present

## 2022-11-05 NOTE — Patient Instructions (Signed)
Medication Instructions:  Your physician recommends that you continue on your current medications as directed. Please refer to the Current Medication list given to you today.  *If you need a refill on your cardiac medications before your next appointment, please call your pharmacy*   Lab Work: None If you have labs (blood work) drawn today and your tests are completely normal, you will receive your results only by: MyChart Message (if you have MyChart) OR A paper copy in the mail If you have any lab test that is abnormal or we need to change your treatment, we will call you to review the results.   Testing/Procedures: None   Follow-Up: At [facility], you and your health needs are our priority.  As part of our continuing mission to provide you with exceptional heart care, we have created designated Provider Care Teams.  These Care Teams include your primary Cardiologist (physician) and Advanced Practice Providers (APPs -  Physician Assistants and Nurse Practitioners) who all work together to provide you with the care you need, when you need it.  We recommend signing up for the patient portal called "MyChart".  Sign up information is provided on this After Visit Summary.  MyChart is used to connect with patients for Virtual Visits (Telemedicine).  Patients are able to view lab/test results, encounter notes, upcoming appointments, etc.  Non-urgent messages can be sent to your provider as well.   To learn more about what you can do with MyChart, go to [email or web address].    Your next appointment:   1 year(s)  Provider:   Norman Herrlich, MD    Other Instructions Check home blood pressure daily and send a list of blood pressures to the office in 2 weeks

## 2022-11-20 DIAGNOSIS — Z87440 Personal history of urinary (tract) infections: Secondary | ICD-10-CM | POA: Diagnosis not present

## 2022-11-21 DIAGNOSIS — H353221 Exudative age-related macular degeneration, left eye, with active choroidal neovascularization: Secondary | ICD-10-CM | POA: Diagnosis not present

## 2022-12-01 ENCOUNTER — Other Ambulatory Visit: Payer: Self-pay | Admitting: Cardiology

## 2022-12-02 DIAGNOSIS — H353222 Exudative age-related macular degeneration, left eye, with inactive choroidal neovascularization: Secondary | ICD-10-CM | POA: Diagnosis not present

## 2022-12-03 ENCOUNTER — Telehealth: Payer: Self-pay | Admitting: Cardiology

## 2022-12-03 ENCOUNTER — Other Ambulatory Visit: Payer: Self-pay

## 2022-12-03 MED ORDER — LOSARTAN POTASSIUM 50 MG PO TABS: 50.0000 mg | ORAL_TABLET | Freq: Two times a day (BID) | ORAL | 3 refills | Status: DC

## 2022-12-03 NOTE — Telephone Encounter (Signed)
Received a call from the stat phone regarding this patient. Patient was upset because she had not received a call back regarding the call to the office she had left earlier during the same day after I had sent a message to Dr. Dulce Sellar. I informed the patient of Dr. Hulen Shouts recommendation below:  "Lets have her take losartan twice daily second dose in the evening Is very important to use good technique for blood pressure the first reading in the morning before medications usually is great variability and I really prefer to use good technique rest 5 minutes uncross legs support her back and feet support the arm and check her blood pressure midday early evening record and I really prefer to use good technique rest 5 minutes uncross legs support her back and feet support the arm and check her blood pressure midday early evening record and send a list 2 weeks"  Patient verbalized understanding and had no further questions at this time.

## 2022-12-03 NOTE — Telephone Encounter (Signed)
Pt c/o BP issue: STAT if pt c/o blurred vision, one-sided weakness or slurred speech  1. What are your last 5 BP readings? 178/99 - This morning 174/99 - Yesterday  2. Are you having any other symptoms (ex. Dizziness, headache, blurred vision, passed out)? Dizziness and headache  3. What is your BP issue? Pt states that BP has been elevated since yesterday morning. She would like a callback regarding this matter.Please advise

## 2022-12-03 NOTE — Telephone Encounter (Signed)
Called patient and she reported that her blood pressure had been high for the past couple of days. Yesterday it 174/99 and this morning it was 178/99. She also reported that she had been having some dizziness and headaches related to her blood pressure. Patient stated that she took her blood pressure when she got up this morning and then took her medication based on the result. Patient was instructed to take her blood pressure medication first and then 2 hours later to check her blood pressure to get an accurate representation of her blood pressure. Patient asked that I send her blood pressure results to Dr. Dulce Sellar for him to evaluate. Please advise.

## 2022-12-03 NOTE — Telephone Encounter (Signed)
Pt returning phone call to f/u on the hypertension that is going on currently. Please advise.

## 2022-12-08 ENCOUNTER — Emergency Department (HOSPITAL_BASED_OUTPATIENT_CLINIC_OR_DEPARTMENT_OTHER)
Admission: EM | Admit: 2022-12-08 | Discharge: 2022-12-08 | Disposition: A | Discharge status: Home / Self Care | Payer: Medicare Other | Source: Home / Self Care | Attending: Emergency Medicine | Admitting: Emergency Medicine

## 2022-12-08 ENCOUNTER — Encounter (HOSPITAL_BASED_OUTPATIENT_CLINIC_OR_DEPARTMENT_OTHER): Payer: Self-pay

## 2022-12-08 ENCOUNTER — Emergency Department (HOSPITAL_BASED_OUTPATIENT_CLINIC_OR_DEPARTMENT_OTHER): Payer: Medicare Other

## 2022-12-08 DIAGNOSIS — Z7982 Long term (current) use of aspirin: Secondary | ICD-10-CM | POA: Insufficient documentation

## 2022-12-08 DIAGNOSIS — I159 Secondary hypertension, unspecified: Secondary | ICD-10-CM | POA: Diagnosis not present

## 2022-12-08 DIAGNOSIS — R519 Headache, unspecified: Secondary | ICD-10-CM | POA: Insufficient documentation

## 2022-12-08 DIAGNOSIS — R030 Elevated blood-pressure reading, without diagnosis of hypertension: Secondary | ICD-10-CM | POA: Diagnosis present

## 2022-12-08 DIAGNOSIS — I10 Essential (primary) hypertension: Secondary | ICD-10-CM | POA: Insufficient documentation

## 2022-12-08 DIAGNOSIS — R42 Dizziness and giddiness: Secondary | ICD-10-CM | POA: Insufficient documentation

## 2022-12-08 DIAGNOSIS — Z79899 Other long term (current) drug therapy: Secondary | ICD-10-CM | POA: Insufficient documentation

## 2022-12-08 LAB — BASIC METABOLIC PANEL
Anion gap: 7 (ref 5–15)
BUN: 16 mg/dL (ref 8–23)
CO2: 30 mmol/L (ref 22–32)
Calcium: 10 mg/dL (ref 8.9–10.3)
Chloride: 103 mmol/L (ref 98–111)
Creatinine, Ser: 0.59 mg/dL (ref 0.44–1.00)
GFR, Estimated: >60 mL/min (ref >60)
Glucose, Bld: 90 mg/dL (ref 70–99)
Potassium: 4 mmol/L (ref 3.5–5.1)
Sodium: 140 mmol/L (ref 135–145)

## 2022-12-08 LAB — CBC
HCT: 41.1 % (ref 36.0–46.0)
Hemoglobin: 13.6 g/dL (ref 12.0–15.0)
MCH: 30.7 pg (ref 26.0–34.0)
MCHC: 33.1 g/dL (ref 30.0–36.0)
MCV: 92.8 fL (ref 80.0–100.0)
Platelets: 194 10*3/uL (ref 150–400)
RBC: 4.43 MIL/uL (ref 3.87–5.11)
RDW: 14.2 % (ref 11.5–15.5)
WBC: 6.6 10*3/uL (ref 4.0–10.5)
nRBC: 0 % (ref 0.0–0.2)

## 2022-12-08 MED ORDER — AMLODIPINE BESYLATE 5 MG PO TABS: 5.0000 mg | ORAL_TABLET | Freq: Every day | ORAL | 1 refills | Status: DC

## 2022-12-08 NOTE — ED Provider Notes (Signed)
[place] EMERGENCY DEPARTMENT AT [facility] Provider Note   CSN: [id number] Arrival date & time: 12/08/22  1103     History  Chief Complaint  Patient presents with   Hypertension    Melissa Love is a 87 y.o. female.  87 year old female with a history of hypertension who presents emergency department with elevated blood pressures.  Patient reports that since Monday she has had elevated blood pressures.  Today when she woke up her blood pressure was over 200 systolic and she decided to come into the emergency department for evaluation.  Says that she has had a dull frontal headache occasionally in the past few days.  Was not abrupt in onset.  No photo or phonophobia or nausea or vomiting.  Also has felt lightheaded with standing and occasionally when she turns her head in bed.  No room spinning sensation.  No difficulty walking, numbness or weakness of her arms or legs, slurred speech, or facial droop.  Says that her doctor increased her losartan to 50 mg twice daily but has not been improving her blood pressures.       Home Medications Prior to Admission medications   Medication Sig Start Date End Date Taking? Authorizing Provider  acetaminophen (TYLENOL) 650 MG CR tablet Take 650 mg by mouth every 8 (eight) hours.    [provider]  amLODipine (NORVASC) 5 MG tablet Take 1 tablet (5 mg total) by mouth daily. 12/08/22   Rondel Baton, MD  aspirin EC 81 MG tablet Take 1 tablet (81 mg total) by mouth daily. Swallow whole. 07/23/22   Ghimire, Werner Lean, MD  atorvastatin (LIPITOR) 40 MG tablet Take 1 tablet (40 mg total) by mouth daily. 07/24/22   Ghimire, Werner Lean, MD  calcium carbonate (TUMS - DOSED IN MG ELEMENTAL CALCIUM) 500 MG chewable tablet Chew 1 tablet by mouth daily as needed for indigestion or heartburn.    [provider]  cephALEXin (KEFLEX) 250 MG capsule Take 250 mg by mouth daily.    [provider]  cetirizine (ZYRTEC) 10 MG  tablet Take 10 mg by mouth daily.    [provider]  Coenzyme Q10 (COQ10) 100 MG CAPS Take 100 mg by mouth daily.    [provider]  fluticasone (FLONASE) 50 MCG/ACT nasal spray Place 2 sprays into both nostrils daily.    [provider]  gabapentin (NEURONTIN) 100 MG capsule Take 100 mg by mouth 2 (two) times daily.    [provider]  levothyroxine (SYNTHROID) 25 MCG tablet Take 25 mcg by mouth daily before breakfast.    [provider]  LORazepam (ATIVAN) 0.5 MG tablet Take 0.5 mg by mouth at bedtime.    [provider]  losartan (COZAAR) 50 MG tablet Take 1 tablet (50 mg total) by mouth in the morning and at bedtime. 12/03/22   Baldo Daub, MD  Magnesium 200 MG TABS Take 200 mg by mouth daily as needed (Muscle cramps).    [provider]  metoprolol succinate (TOPROL XL) 25 MG 24 hr tablet Take 1 tablet (25 mg total) by mouth daily. 08/02/22   Baldo Daub, MD  Multiple Vitamins-Minerals (CENTRUM SILVER PO) Take 1 tablet by mouth daily.    [provider]  Multiple Vitamins-Minerals (PRESERVISION AREDS 2) CAPS Take 1 capsule by mouth daily.    [provider]  pantoprazole (PROTONIX) 40 MG tablet Take 1 tablet (40 mg total) by mouth daily. Patient not taking: Reported on  11/05/2022 07/24/22   GhimireWerner Lean, MD  Polyethyl Glycol-Propyl Glycol (SYSTANE FREE OP) Place 1 drop into both eyes as needed (Dry eye).    [provider]  predniSONE (DELTASONE) 10 MG tablet Take by mouth. Patient not taking: Reported on 11/05/2022 09/18/22   [provider]  Ranibizumab (LUCENTIS) 0.5 MG/0.05ML SOSY by Intravitreal route.    [provider]  sertraline (ZOLOFT) 50 MG tablet Take 50 mg by mouth at bedtime.    [provider]      Allergies    Hiprex [methenamine], Morphine, Penicillins, Sulfa antibiotics, and Sulfamethoxazole-trimethoprim    Review of Systems   Review of  Systems  Physical Exam Updated Vital Signs BP (!) 156/83   Pulse 69   Temp 97.9 F (36.6 C)   Resp 19   Ht 5\' 2"  (1.575 m)   Wt 77.1 kg   SpO2 97%   BMI 31.09 kg/m  Physical Exam Vitals and nursing note reviewed.  Constitutional:      General: She is not in acute distress.    Appearance: She is well-developed.  HENT:     Head: Normocephalic and atraumatic.     Right Ear: External ear normal.     Left Ear: External ear normal.     Nose: Nose normal.  Eyes:     Extraocular Movements: Extraocular movements intact.     Conjunctiva/sclera: Conjunctivae normal.     Pupils: Pupils are equal, round, and reactive to light.  Cardiovascular:     Rate and Rhythm: Normal rate and regular rhythm.     Heart sounds: No murmur heard. Pulmonary:     Effort: Pulmonary effort is normal. No respiratory distress.     Breath sounds: Normal breath sounds.  Musculoskeletal:     Cervical back: Normal range of motion and neck supple.     Right lower leg: No edema.     Left lower leg: No edema.  Skin:    General: Skin is warm and dry.  Neurological:     Mental Status: She is alert and oriented to person, place, and time. Mental status is at baseline.     Comments: MENTAL STATUS: AAOx3 CRANIAL NERVES: II: Pupils equal and reactive 4 mm BL, no RAPD, no VF deficits III, IV, VI: EOM intact, no gaze preference or deviation, no nystagmus. V: normal sensation to light touch in V1, V2, and V3 segments bilaterally VII: no facial weakness or asymmetry, no nasolabial fold flattening VIII: normal hearing to speech and finger friction IX, X: normal palatal elevation, no uvular deviation XI: 5/5 head turn and 5/5 shoulder shrug bilaterally XII: midline tongue protrusion MOTOR: 5/5 strength in R shoulder flexion, elbow flexion and extension, and grip strength. 5/5 strength in L shoulder flexion, elbow flexion and extension, and grip strength.  5/5 strength in R hip and knee flexion, knee extension, ankle  plantar and dorsiflexion. 5/5 strength in L hip and knee flexion, knee extension, ankle plantar and dorsiflexion. SENSORY: Normal sensation to light touch in all extremities COORD: Normal finger to nose and heel to shin, no tremor, no dysmetria STATION: normal stance, no truncal ataxia GAIT: Normal   Psychiatric:        Mood and Affect: Mood normal.     ED Results / Procedures / Treatments   Labs (all labs ordered are listed, but only abnormal results are displayed) Labs Reviewed  BASIC METABOLIC PANEL  CBC    EKG EKG Interpretation Date/Time:  Sunday December 08 2022  11:15:13 EDT Ventricular Rate:  71 PR Interval:  190 QRS Duration:  78 QT Interval:  380 QTC Calculation: 412 R Axis:   -13  Text Interpretation: Normal sinus rhythm Anterior infarct , age undetermined Abnormal ECG baseline artifact limits interpretation Confirmed by Vonita Moss [phone]) on 12/08/2022 12:55:06 PM  Radiology CT Head Wo Contrast  Result Date: 12/08/2022 CLINICAL DATA:  Headache and dizziness. EXAM: CT HEAD WITHOUT CONTRAST TECHNIQUE: Contiguous axial images were obtained from the base of the skull through the vertex without intravenous contrast. RADIATION DOSE REDUCTION: This exam was performed according to the departmental dose-optimization program which includes automated exposure control, adjustment of the mA and/or kV according to patient size and/or use of iterative reconstruction technique. COMPARISON:  10/30/2022 FINDINGS: Brain: There is no evidence for acute hemorrhage, hydrocephalus, mass lesion, or abnormal extra-axial fluid collection. No definite CT evidence for acute infarction. Vascular: No hyperdense vessel or unexpected calcification. Skull: No evidence for fracture. No worrisome lytic or sclerotic lesion. Sinuses/Orbits: The visualized paranasal sinuses and mastoid air cells are clear. Visualized portions of the globes and intraorbital fat are unremarkable. Other: None. IMPRESSION:  Unremarkable head CT. No acute intracranial abnormality. Electronically Signed   By: Kennith Center M.D.   On: 12/08/2022 13:42    Procedures Procedures    Medications Ordered in ED Medications - No data to display  ED Course/ Medical Decision Making/ A&P                             Medical Decision Making Amount and/or Complexity of Data Reviewed Labs: ordered. Radiology: ordered.  Risk Prescription drug management.   Melissa Love is a 87 y.o. female with comorbidities that complicate the patient evaluation including hypertension who presents emergency department with elevated blood pressure, headache, and dizziness  Initial Ddx:  Hypertension, migraine, hypertensive emergency, ICH, stroke, arrhythmia  MDM/Course:  Initial is concerned about hypertension given the patient's symptoms.  Her headache and dizziness appear to be very mild.  Has a nonfocal neurologic exam making stroke, ICH, or other hypertensive emergency highly unlikely.  Did obtain a CT head due to her age which was unremarkable.  Not having any vision symptoms or weakness or soreness of her arms or legs so feel that temporal arteritis is highly unlikely.  Also describes as a bilateral frontal headache which would be highly unusual for that diagnosis.  In terms of her dizziness, EKG was obtained which did not show any signs of arrhythmia including Brugada, long QT, or WPW.  No ataxia on exam to suggest a stroke.  Upon re-evaluation her blood pressure had spontaneously improved to the 150 systolic.  Labs were obtained which did not show any acute abnormalities.  Do not see any signs of a hypertensive emergency today and feel that she is still stable for outpatient management of her hypertension.  Will go ahead and prescribe her amlodipine since her blood pressures are still above goal.  Will have her follow-up with her primary doctor in a week to discuss if any additional medication changes need to be made.  This patient  presents to the ED for concern of complaints listed in HPI, this involves an extensive number of treatment options, and is a complaint that carries with it a high risk of complications and morbidity. Disposition including potential need for admission considered.   Dispo: DC Home. Return precautions discussed including, but not limited to, those listed in the AVS. Allowed  pt time to ask questions which were answered fully prior to dc.  Additional history obtained from  friend Records reviewed Outpatient Clinic Notes The following labs were independently interpreted: Chemistry and show no acute abnormality I independently reviewed the following imaging with scope of interpretation limited to determining acute life threatening conditions related to emergency care: CT Head and agree with the radiologist interpretation with the following exceptions: none I personally reviewed and interpreted cardiac monitoring: normal sinus rhythm  I personally reviewed and interpreted the pt's EKG: see above for interpretation  I have reviewed the patients home medications and made adjustments as needed Social Determinants of health:  Elderly         Final Clinical Impression(s) / ED Diagnoses Final diagnoses:  Secondary hypertension    Rx / DC Orders ED Discharge Orders          Ordered    amLODipine (NORVASC) 5 MG tablet  Daily,   Status:  Discontinued        12/08/22 1423    amLODipine (NORVASC) 5 MG tablet  Daily        12/08/22 1426              Rondel Baton, MD 12/08/22 1429

## 2022-12-08 NOTE — ED Triage Notes (Addendum)
Pt states her BP has been erratic. Pt states she called her PCP, who increased her Losartan on Tuesday. Pt states her BP today 216/100.  Pt endorses dizziness and headaches  Pt also took metoprolol prior to arrival.

## 2022-12-08 NOTE — Discharge Instructions (Addendum)
You were seen for your elevated blood pressure in the emergency department.   At home, please start taking the amlodipine we have prescribed you in addition to your losartan.    Check your MyChart online for the results of any tests that had not resulted by the time you left the emergency department.   Follow-up with your primary doctor in 1 week regarding your visit.    Return immediately to the emergency department if you experience any of the following: Chest pain, shortness of breath, severe headache, or any other concerning symptoms.    Thank you for visiting our Emergency Department. It was a pleasure taking care of you today.

## 2022-12-11 ENCOUNTER — Ambulatory Visit: Payer: Medicare Other | Attending: Cardiology | Admitting: Cardiology

## 2022-12-11 ENCOUNTER — Encounter: Payer: Self-pay | Admitting: Cardiology

## 2022-12-11 VITALS — BP 132/80 | HR 81 | Ht 62.0 in | Wt 168.6 lb

## 2022-12-11 DIAGNOSIS — I10 Essential (primary) hypertension: Secondary | ICD-10-CM | POA: Diagnosis present

## 2022-12-11 DIAGNOSIS — I4719 Other supraventricular tachycardia: Secondary | ICD-10-CM

## 2022-12-11 DIAGNOSIS — E782 Mixed hyperlipidemia: Secondary | ICD-10-CM | POA: Diagnosis present

## 2022-12-11 MED ORDER — CLONIDINE HCL 0.1 MG PO TABS: 0.1000 mg | ORAL_TABLET | Freq: Every day | ORAL | 11 refills | Status: DC | PRN

## 2022-12-11 NOTE — Patient Instructions (Signed)
Medication Instructions:  Your physician has recommended you make the following change in your medication:  Continue BP Log TAke Clonidine 0.1 mg if Systolic is 180 or above and or Diastolic is 100 or above  *If you need a refill on your cardiac medications before your next appointment, please call your pharmacy*   Lab Work: NONE If you have labs (blood work) drawn today and your tests are completely normal, you will receive your results only by: MyChart Message (if you have MyChart) OR A paper copy in the mail If you have any lab test that is abnormal or we need to change your treatment, we will call you to review the results.   Testing/Procedures: NONE   Follow-Up: At [facility], you and your health needs are our priority.  As part of our continuing mission to provide you with exceptional heart care, we have created designated Provider Care Teams.  These Care Teams include your primary Cardiologist (physician) and Advanced Practice Providers (APPs -  Physician Assistants and Nurse Practitioners) who all work together to provide you with the care you need, when you need it.  We recommend signing up for the patient portal called "MyChart".  Sign up information is provided on this After Visit Summary.  MyChart is used to connect with patients for Virtual Visits (Telemedicine).  Patients are able to view lab/test results, encounter notes, upcoming appointments, etc.  Non-urgent messages can be sent to your provider as well.   To learn more about what you can do with MyChart, go to [email or web address].    Your next appointment:   3 month(s)  Provider:   Norman Herrlich, MD    Other Instructions

## 2022-12-11 NOTE — Progress Notes (Signed)
Cardiology Office Note:  .   Date:  12/11/2022  ID:  Melissa Love, DOB [birth year], MRN [id number] PCP: Melissa Love., MD  [facility] Providers Cardiologist:  None    History of Present Illness: .   Melissa Love is a 87 y.o. female with a past medical history of hypertension, paroxysmal atrial tachycardia, hyperlipidemia, nodule of thyroid.  09/02/2022 30-day monitor-average heart rate 69 bpm, predominantly sinus rhythm, no atrial fibrillation 07/23/2022 echocardiogram EF 65 to 70%, grade 1 DD  Recently evaluated by Dr. Dulce Love on 11/05/2022, her blood pressure was elevated at 140/64, she was advised to start a blood pressure log.  Evaluated in the ED on 12/08/2022 with complaints of elevated blood pressure readings, accompanied by headache.  Her blood pressure had improved upon its own to 150s systolic, labs were unremarkable.  She was given amlodipine at discharge.  CT of her head was unremarkable.  She presents today after ED evaluation as outlined above.  Leading up to her ED visit, she reported her blood pressures had been very elevated for a week as high as 190 systolic--she also endorses a high level of personal stress surrounding this time with her [place] moving [place] to the beach, and death of some friends.  When she presented to the ED her blood pressure was elevated at 212/123.  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  She has been keeping a blood pressure record at home, we reviewed these results and it appears that her blood pressure is trending down in the right direction after starting the amlodipine. She denies chest pain, palpitations, dyspnea, pnd, orthopnea, n, v, dizziness, syncope, edema, weight gain, or early satiety.   ROS: Review of Systems  Constitutional: Negative.   HENT: Negative.    Eyes: Negative.   Respiratory: Negative.    Cardiovascular: Negative.   Gastrointestinal: Negative.    Musculoskeletal: Negative.  Negative for falls.  Skin: Negative.   Neurological:  Positive for headaches.  Endo/Heme/Allergies:  Bruises/bleeds easily.  Psychiatric/Behavioral: Negative.       Studies Reviewed: .        Cardiac Studies & Procedures       ECHOCARDIOGRAM  ECHOCARDIOGRAM COMPLETE 07/23/2022  Narrative ECHOCARDIOGRAM REPORT    Patient Name:   Melissa Love Date of Exam: 07/23/2022 Medical Rec #:  [id number]       Height:       63.0 in Accession #:    [id number]      Weight:       163.1 lb Date of Birth:  1936-04-08       BSA:          1.773 m Patient Age:    86 years        BP:           171/79 mmHg Patient Gender: F               HR:           72 bpm. Exam Location:  Inpatient  Procedure: 2D Echo, Color Doppler, Cardiac Doppler and Intracardiac Opacification Agent  Indications:    TIA  History:        Patient has no prior history of Echocardiogram examinations. Risk Factors:Hypertension.  Sonographer:    Melissa Love Referring Phys: [id number] Melissa Love   Sonographer Comments: Technically difficult study due to poor echo windows. Image acquisition challenging due to  patient body habitus and Image acquisition challenging due to respiratory motion. IMPRESSIONS   1. LV function best assessed on contrast views (87). Left ventricular ejection fraction, by estimation, is 65 to 70%. The left ventricle has normal function. The left ventricle has no regional wall motion abnormalities. Left ventricular diastolic parameters are consistent with Grade I diastolic dysfunction (impaired relaxation). 2. Right ventricular systolic function is normal. The right ventricular size is normal. 3. The mitral valve is grossly normal. No evidence of mitral valve regurgitation. 4. Mild aortic regurgitation, with vena contracta 2.5 mm. The aortic valve was not well visualized. Aortic valve regurgitation is mild. Aortic regurgitation PHT measures 256 msec. 5. The  inferior vena cava is normal in size with greater than 50% respiratory variability, suggesting right atrial pressure of 3 mmHg.  Comparison(s): No prior Echocardiogram.  FINDINGS Left Ventricle: LV function best assessed on contrast views (87). Left ventricular ejection fraction, by estimation, is 65 to 70%. The left ventricle has normal function. The left ventricle has no regional wall motion abnormalities. Definity contrast agent was given IV to delineate the left ventricular endocardial borders. The left ventricular internal cavity size was normal in size. There is no left ventricular hypertrophy. Left ventricular diastolic parameters are consistent with Grade I diastolic dysfunction (impaired relaxation).  Right Ventricle: The right ventricular size is normal. No increase in right ventricular wall thickness. Right ventricular systolic function is normal.  Left Atrium: Left atrial size was normal in size.  Right Atrium: Right atrial size was normal in size.  Pericardium: There is no evidence of pericardial effusion. Presence of epicardial fat layer.  Mitral Valve: The mitral valve is grossly normal. Mild mitral annular calcification. No evidence of mitral valve regurgitation.  Tricuspid Valve: The tricuspid valve is not well visualized. Tricuspid valve regurgitation is not demonstrated. No evidence of tricuspid stenosis.  Aortic Valve: Mild aortic regurgitation, with vena contracta 2.5 mm. The aortic valve was not well visualized. Aortic valve regurgitation is mild. Aortic regurgitation PHT measures 256 msec.  Pulmonic Valve: The pulmonic valve was not well visualized. Pulmonic valve regurgitation is not visualized. No evidence of pulmonic stenosis.  Aorta: The aortic root is normal in size and structure.  Venous: The inferior vena cava is normal in size with greater than 50% respiratory variability, suggesting right atrial pressure of 3 mmHg.  IAS/Shunts: No atrial level shunt  detected by color flow Doppler.   LEFT VENTRICLE PLAX 2D LVIDd:         4.50 cm   Diastology LVIDs:         3.90 cm   LV e' medial:    4.35 cm/s LV PW:         0.80 cm   LV E/e' medial:  15.0 LV IVS:        0.80 cm   LV e' lateral:   6.96 cm/s LVOT diam:     2.00 cm   LV E/e' lateral: 9.4 LV SV:         47 LV SV Index:   27 LVOT Area:     3.14 cm   RIGHT VENTRICLE RV S prime:     12.30 cm/s TAPSE (M-mode): 1.7 cm  LEFT ATRIUM             Index        RIGHT ATRIUM           Index LA diam:        2.60 cm 1.47 cm/m  RA Area:     12.60 cm LA Vol (A2C):   34.3 ml 19.34 ml/m  RA Volume:   27.80 ml  15.68 ml/m LA Vol (A4C):   24.0 ml 13.53 ml/m LA Biplane Vol: 29.8 ml 16.81 ml/m AORTIC VALVE LVOT Vmax:   75.80 cm/s LVOT Vmean:  52.300 cm/s LVOT VTI:    0.151 m AI PHT:      256 msec  AORTA Ao Root diam: 3.00 cm  MITRAL VALVE MV Area (PHT): 2.83 cm    SHUNTS MV Decel Time: 268 msec    Systemic VTI:  0.15 m MV E velocity: 65.10 cm/s  Systemic Diam: 2.00 cm MV A velocity: 77.10 cm/s MV E/A ratio:  0.84  Riley Lam MD Electronically signed by Riley Lam MD Signature Date/Time: 07/23/2022/9:45:13 AM    Final    MONITORS  CARDIAC EVENT MONITOR 09/11/2022  Narrative Summary: The patient's monitoring period was 08/02/2022 - 08/31/2022. Baseline sample showed Sinus Rhythm with a heart rate of 69.5 bpm. There were 0 critical, 0 serious, and 3 stable events that occurred. The report analysis of the critical, serious, stable and manually triggered events are listed below. Manually Detected Events: 1 Stable: Sinus Rhythm  Baseline, None or Accidental Push 1 Stable: Sinus Rhythm  None or Accidental Push 1 Stable: Sinus Rhythm  None Reported           Risk Assessment/Calculations:             Physical Exam:   VS:  BP 132/80 (BP Location: Right Arm, Patient Position: Sitting, Cuff Size: Normal)   Pulse 81   Ht 5\' 2"  (1.575 m)   Wt 168 lb 9.6  oz (76.5 kg)   SpO2 93%   BMI 30.84 kg/m    Wt Readings from Last 3 Encounters:  12/11/22 168 lb 9.6 oz (76.5 kg)  12/08/22 170 lb (77.1 kg)  11/05/22 168 lb (76.2 kg)    GEN: Appears younger than stated age, well nourished, well developed in no acute distress NECK: No JVD; No carotid bruits CARDIAC: RRR, no murmurs, rubs, gallops RESPIRATORY:  Clear to auscultation without rales, wheezing or rhonchi  ABDOMEN: Soft, non-tender, non-distended EXTREMITIES:  No edema; No deformity   ASSESSMENT AND PLAN: .   Hypertension-blood pressure today is controlled at 132/80, she checks it usually twice throughout the day.  We reviewed her blood pressure log from home and it appears that her blood pressure is trending down with the initiation of amlodipine.  Will not make any changes to her antihypertensive regimen at this time.  Continue amlodipine 5 mg daily, continue losartan 50 mg in the evening, continue metoprolol 25 mg daily.  Will send in clonidine 0.1 mg to be taken if her systolic blood pressure is 180 or greater and/or diastolic 100 or greater, if her blood pressure does not come down with her normal antihypertensive medications.  Lab work in the ED revealed normal kidney function, normal electrolytes, normal CBC without anemia or infection. Paroxysmal atrial tachycardia-quiescent, continue metoprolol 25 mg daily Hyperlipidemia-most recent LDL was 82 on 07/23/2022, currently controlled, continue simvastatin 20 mg daily, this appears to be managed by her PCP.       Dispo: Continue to keep blood pressure log.  Clonidine as needed for elevated blood pressure reading as outlined above.  Follow-up in 3 months.  Signed, Flossie Dibble, NP

## 2022-12-16 ENCOUNTER — Telehealth: Payer: Self-pay

## 2022-12-16 NOTE — Telephone Encounter (Signed)
Transition Care Management Unsuccessful Follow-up Telephone Call  Date of discharge and from where:  12/08/2022 [facility]  Attempts:  1st Attempt  Reason for unsuccessful TCM follow-up call:  Unable to leave message  Roniyah Llorens [facility]  [facility] Population Health Community Resource Care Guide   [email or web address]  ?? [phone]   Website: [email or web address]  [email or web address]

## 2022-12-16 NOTE — Telephone Encounter (Signed)
Transition Care Management Unsuccessful Follow-up Telephone Call  Date of discharge and from where:  12/08/2022 [facility].  Attempts:  2nd Attempt  Reason for unsuccessful TCM follow-up call:  Unable to leave message  Megan Hayduk [facility]  [facility] Population Health Community Resource Care Guide   [email or web address]  ?? [phone]   Website: [email or web address]  [email or web address]

## 2022-12-24 DIAGNOSIS — G8929 Other chronic pain: Secondary | ICD-10-CM | POA: Diagnosis not present

## 2022-12-24 DIAGNOSIS — M25512 Pain in left shoulder: Secondary | ICD-10-CM | POA: Diagnosis not present

## 2022-12-31 ENCOUNTER — Telehealth: Payer: Self-pay | Admitting: Cardiology

## 2022-12-31 NOTE — Telephone Encounter (Signed)
Pt c/o medication issue:  1. Name of Medication: amLODipine (NORVASC) 5 MG tablet  simvastatin (ZOCOR) 20 MG tablet   losartan (COZAAR) 50 MG tablet   2. How are you currently taking this medication (dosage and times per day)? As written  3. Are you having a reaction (difficulty breathing--STAT)? No  4. What is your medication issue?  Patient states that her heart medications may be causing her to not have a sense of urgency to urinate during the day, but at night the patient is having to get up every two hours to urinate. Patient is wondering if her heart medications needs to be adjusted. Please advise.

## 2022-12-31 NOTE — Telephone Encounter (Signed)
Results reviewed with pt as per Margaretmary Dys RPH-CPP's note.  Pt verbalized understanding and had no additional questions.

## 2022-12-31 NOTE — Telephone Encounter (Signed)
None of her cardiac medications affect urination/fluid status.  Symptoms should be unrelated to her meds.

## 2023-01-17 ENCOUNTER — Telehealth: Payer: Self-pay | Admitting: Cardiology

## 2023-01-17 NOTE — Telephone Encounter (Signed)
Pt c/o BP issue: STAT if pt c/o blurred vision, one-sided weakness or slurred speech  1. What are your last 5 BP readings?    190/100 - this morning  128/67 - now  2. Are you having any other symptoms (ex. Dizziness, headache, blurred vision, passed out)? Dizziness   3. What is your BP issue?   Pt called in stating her bp was high this morning. She states she took all her bp medications after that as normal and took the clonidine as told by Elliot Gurney, NP at last visit. She states her bp is back down now but she is still feeling very dizzy. Please advise.

## 2023-01-18 ENCOUNTER — Observation Stay (HOSPITAL_COMMUNITY): Payer: Medicare Other

## 2023-01-18 ENCOUNTER — Encounter (HOSPITAL_BASED_OUTPATIENT_CLINIC_OR_DEPARTMENT_OTHER): Payer: Self-pay | Admitting: Emergency Medicine

## 2023-01-18 ENCOUNTER — Observation Stay (HOSPITAL_BASED_OUTPATIENT_CLINIC_OR_DEPARTMENT_OTHER)
Admission: EM | Admit: 2023-01-18 | Discharge: 2023-01-19 | Disposition: A | Discharge status: Home / Self Care | Payer: Medicare Other | Attending: Student | Admitting: Student

## 2023-01-18 ENCOUNTER — Other Ambulatory Visit: Payer: Self-pay

## 2023-01-18 ENCOUNTER — Emergency Department (HOSPITAL_BASED_OUTPATIENT_CLINIC_OR_DEPARTMENT_OTHER): Payer: Medicare Other

## 2023-01-18 DIAGNOSIS — R42 Dizziness and giddiness: Secondary | ICD-10-CM

## 2023-01-18 DIAGNOSIS — R269 Unspecified abnormalities of gait and mobility: Secondary | ICD-10-CM | POA: Diagnosis not present

## 2023-01-18 DIAGNOSIS — I110 Hypertensive heart disease with heart failure: Secondary | ICD-10-CM | POA: Insufficient documentation

## 2023-01-18 DIAGNOSIS — I700 Atherosclerosis of aorta: Secondary | ICD-10-CM | POA: Diagnosis not present

## 2023-01-18 DIAGNOSIS — R41841 Cognitive communication deficit: Secondary | ICD-10-CM | POA: Diagnosis not present

## 2023-01-18 DIAGNOSIS — E785 Hyperlipidemia, unspecified: Secondary | ICD-10-CM | POA: Diagnosis not present

## 2023-01-18 DIAGNOSIS — Z96642 Presence of left artificial hip joint: Secondary | ICD-10-CM | POA: Insufficient documentation

## 2023-01-18 DIAGNOSIS — Z79899 Other long term (current) drug therapy: Secondary | ICD-10-CM | POA: Insufficient documentation

## 2023-01-18 DIAGNOSIS — I160 Hypertensive urgency: Secondary | ICD-10-CM | POA: Insufficient documentation

## 2023-01-18 DIAGNOSIS — I708 Atherosclerosis of other arteries: Secondary | ICD-10-CM

## 2023-01-18 DIAGNOSIS — Z7982 Long term (current) use of aspirin: Secondary | ICD-10-CM | POA: Insufficient documentation

## 2023-01-18 DIAGNOSIS — Z87891 Personal history of nicotine dependence: Secondary | ICD-10-CM | POA: Insufficient documentation

## 2023-01-18 DIAGNOSIS — I672 Cerebral atherosclerosis: Secondary | ICD-10-CM | POA: Diagnosis not present

## 2023-01-18 DIAGNOSIS — F419 Anxiety disorder, unspecified: Secondary | ICD-10-CM | POA: Diagnosis present

## 2023-01-18 DIAGNOSIS — I6782 Cerebral ischemia: Secondary | ICD-10-CM | POA: Diagnosis not present

## 2023-01-18 DIAGNOSIS — I6529 Occlusion and stenosis of unspecified carotid artery: Secondary | ICD-10-CM | POA: Diagnosis not present

## 2023-01-18 DIAGNOSIS — R519 Headache, unspecified: Secondary | ICD-10-CM | POA: Diagnosis not present

## 2023-01-18 DIAGNOSIS — I5030 Unspecified diastolic (congestive) heart failure: Secondary | ICD-10-CM | POA: Insufficient documentation

## 2023-01-18 DIAGNOSIS — Z87440 Personal history of urinary (tract) infections: Secondary | ICD-10-CM | POA: Diagnosis present

## 2023-01-18 DIAGNOSIS — R29818 Other symptoms and signs involving the nervous system: Secondary | ICD-10-CM | POA: Diagnosis present

## 2023-01-18 DIAGNOSIS — I6509 Occlusion and stenosis of unspecified vertebral artery: Secondary | ICD-10-CM | POA: Diagnosis not present

## 2023-01-18 DIAGNOSIS — I6502 Occlusion and stenosis of left vertebral artery: Secondary | ICD-10-CM | POA: Diagnosis not present

## 2023-01-18 DIAGNOSIS — I639 Cerebral infarction, unspecified: Secondary | ICD-10-CM | POA: Diagnosis not present

## 2023-01-18 DIAGNOSIS — E039 Hypothyroidism, unspecified: Secondary | ICD-10-CM | POA: Insufficient documentation

## 2023-01-18 DIAGNOSIS — I771 Stricture of artery: Secondary | ICD-10-CM | POA: Diagnosis not present

## 2023-01-18 DIAGNOSIS — R55 Syncope and collapse: Secondary | ICD-10-CM | POA: Diagnosis not present

## 2023-01-18 DIAGNOSIS — I5032 Chronic diastolic (congestive) heart failure: Secondary | ICD-10-CM | POA: Diagnosis not present

## 2023-01-18 LAB — COMPREHENSIVE METABOLIC PANEL
ALT: 33 U/L (ref 0–44)
AST: 15 U/L (ref 15–41)
Albumin: 4 g/dL (ref 3.5–5.0)
Alkaline Phosphatase: 68 U/L (ref 38–126)
Anion gap: 9 (ref 5–15)
BUN: 11 mg/dL (ref 8–23)
CO2: 28 mmol/L (ref 22–32)
Calcium: 9.1 mg/dL (ref 8.9–10.3)
Chloride: 105 mmol/L (ref 98–111)
Creatinine, Ser: 0.58 mg/dL (ref 0.44–1.00)
GFR, Estimated: >60 mL/min (ref >60)
Glucose, Bld: 98 mg/dL (ref 70–99)
Potassium: 3.8 mmol/L (ref 3.5–5.1)
Sodium: 142 mmol/L (ref 135–145)
Total Bilirubin: 0.7 mg/dL (ref 0.3–1.2)
Total Protein: 6.5 g/dL (ref 6.5–8.1)

## 2023-01-18 LAB — URINALYSIS, ROUTINE W REFLEX MICROSCOPIC
Bacteria, UA: NONE SEEN
Bilirubin Urine: NEGATIVE
Glucose, UA: NEGATIVE mg/dL
Hgb urine dipstick: NEGATIVE
Ketones, ur: NEGATIVE mg/dL
Nitrite: NEGATIVE
Specific Gravity, Urine: 1.023 (ref 1.005–1.030)
pH: 6 (ref 5.0–8.0)

## 2023-01-18 LAB — CBC
HCT: 39.9 % (ref 36.0–46.0)
Hemoglobin: 13.1 g/dL (ref 12.0–15.0)
MCH: 31.4 pg (ref 26.0–34.0)
MCHC: 32.8 g/dL (ref 30.0–36.0)
MCV: 95.7 fL (ref 80.0–100.0)
Platelets: 166 10*3/uL (ref 150–400)
RBC: 4.17 MIL/uL (ref 3.87–5.11)
RDW: 14.6 % (ref 11.5–15.5)
WBC: 6.5 10*3/uL (ref 4.0–10.5)
nRBC: 0 % (ref 0.0–0.2)

## 2023-01-18 LAB — DIFFERENTIAL
Abs Immature Granulocytes: 0.02 10*3/uL (ref 0.00–0.07)
Basophils Absolute: 0 10*3/uL (ref 0.0–0.1)
Basophils Relative: 1 %
Eosinophils Absolute: 0 10*3/uL (ref 0.0–0.5)
Eosinophils Relative: 1 %
Immature Granulocytes: 0 %
Lymphocytes Relative: 22 %
Lymphs Abs: 1.5 10*3/uL (ref 0.7–4.0)
Monocytes Absolute: 0.5 10*3/uL (ref 0.1–1.0)
Monocytes Relative: 8 %
Neutro Abs: 4.5 10*3/uL (ref 1.7–7.7)
Neutrophils Relative %: 68 %

## 2023-01-18 LAB — APTT: aPTT: 27 seconds (ref 24–36)

## 2023-01-18 LAB — PHOSPHORUS: Phosphorus: 3.7 mg/dL (ref 2.5–4.6)

## 2023-01-18 LAB — PROTIME-INR
INR: 1 (ref 0.8–1.2)
Prothrombin Time: 13.2 seconds (ref 11.4–15.2)

## 2023-01-18 LAB — ETHANOL: Alcohol, Ethyl (B): <10 mg/dL (ref <10)

## 2023-01-18 LAB — MAGNESIUM: Magnesium: 2.5 mg/dL — ABNORMAL HIGH (ref 1.7–2.4)

## 2023-01-18 LAB — T4, FREE: Free T4: 1.24 ng/dL — ABNORMAL HIGH (ref 0.61–1.12)

## 2023-01-18 LAB — TSH: TSH: 1.638 u[IU]/mL (ref 0.350–4.500)

## 2023-01-18 MED ORDER — METOPROLOL SUCCINATE ER 25 MG PO TB24
25.0000 mg | ORAL_TABLET | Freq: Every day | ORAL | Status: DC
  Administered 2023-01-19: 25 mg via ORAL
  Filled 2023-01-18: qty 1

## 2023-01-18 MED ORDER — GABAPENTIN 100 MG PO CAPS
100.0000 mg | ORAL_CAPSULE | Freq: Two times a day (BID) | ORAL | Status: DC
  Administered 2023-01-18 – 2023-01-19 (×2): 100 mg via ORAL
  Filled 2023-01-18 (×2): qty 1

## 2023-01-18 MED ORDER — SIMVASTATIN 20 MG PO TABS: 20.0000 mg | ORAL_TABLET | Freq: Every day | ORAL | Status: DC

## 2023-01-18 MED ORDER — AMLODIPINE BESYLATE 5 MG PO TABS
5.0000 mg | ORAL_TABLET | Freq: Every day | ORAL | Status: DC
  Administered 2023-01-19: 5 mg via ORAL
  Filled 2023-01-18: qty 1

## 2023-01-18 MED ORDER — SERTRALINE HCL 50 MG PO TABS
50.0000 mg | ORAL_TABLET | Freq: Every day | ORAL | Status: DC
  Administered 2023-01-18: 50 mg via ORAL
  Filled 2023-01-18: qty 1

## 2023-01-18 MED ORDER — STROKE: EARLY STAGES OF RECOVERY BOOK
Freq: Once | Status: AC
  Filled 2023-01-18: qty 1

## 2023-01-18 MED ORDER — ACETAMINOPHEN 325 MG PO TABS
ORAL_TABLET | ORAL | Status: AC
  Filled 2023-01-18: qty 2

## 2023-01-18 MED ORDER — ACETAMINOPHEN 325 MG PO TABS: 650.0000 mg | ORAL_TABLET | ORAL | Status: DC | PRN

## 2023-01-18 MED ORDER — IOHEXOL 350 MG/ML SOLN
80.0000 mL | Freq: Once | INTRAVENOUS | Status: AC | PRN
  Administered 2023-01-18: 80 mL via INTRAVENOUS

## 2023-01-18 MED ORDER — CEPHALEXIN 250 MG PO CAPS
250.0000 mg | ORAL_CAPSULE | Freq: Every day | ORAL | Status: DC
  Administered 2023-01-19: 250 mg via ORAL
  Filled 2023-01-18: qty 1

## 2023-01-18 MED ORDER — LORAZEPAM 0.5 MG PO TABS
0.5000 mg | ORAL_TABLET | Freq: Every day | ORAL | Status: DC
  Administered 2023-01-18: 0.5 mg via ORAL
  Filled 2023-01-18: qty 1

## 2023-01-18 MED ORDER — ACETAMINOPHEN 650 MG RE SUPP: 650.0000 mg | RECTAL | Status: DC | PRN

## 2023-01-18 MED ORDER — ASPIRIN 81 MG PO TBEC
81.0000 mg | DELAYED_RELEASE_TABLET | Freq: Every day | ORAL | Status: DC
  Administered 2023-01-18 – 2023-01-19 (×2): 81 mg via ORAL
  Filled 2023-01-18 (×2): qty 1

## 2023-01-18 MED ORDER — LEVOTHYROXINE SODIUM 25 MCG PO TABS
25.0000 ug | ORAL_TABLET | Freq: Every day | ORAL | Status: DC
  Administered 2023-01-19: 25 ug via ORAL
  Filled 2023-01-18: qty 1

## 2023-01-18 MED ORDER — COQ10 100 MG PO CAPS: 100.0000 mg | ORAL_CAPSULE | Freq: Every day | ORAL | Status: DC

## 2023-01-18 MED ORDER — SODIUM CHLORIDE 0.9 % IV SOLN: INTRAVENOUS | Status: DC

## 2023-01-18 MED ORDER — ACETAMINOPHEN 160 MG/5ML PO SOLN: 650.0000 mg | ORAL | Status: DC | PRN

## 2023-01-18 MED ORDER — ACETAMINOPHEN 325 MG PO TABS
650.0000 mg | ORAL_TABLET | Freq: Once | ORAL | Status: AC
  Administered 2023-01-18: 650 mg via ORAL

## 2023-01-18 NOTE — Assessment & Plan Note (Signed)
-   will admit based on TIA/CVA protocol,        Monitor on Tele       MRA/MRI await  results         CTA  showing chronic V2 lesion       Echo to evaluate for possible embolic source,        obtain cardiac enzymes,  ECG,   Lipid panel, TSH.        Order PT/OT evaluation.    passed swallow eval         Will make sure patient is on antiplatelet ASA 81   and statin        Allow permissive Hypertension keep BP <220/120        Neurology consulted will see in AM

## 2023-01-18 NOTE — Assessment & Plan Note (Addendum)
-   Check TSH continue home medications Synthroid at 25 mcg po q day Hx of thyroid nodules states have had work up

## 2023-01-18 NOTE — Assessment & Plan Note (Addendum)
Check lipid panel continue Zocor 20 m gpo q day

## 2023-01-18 NOTE — Assessment & Plan Note (Signed)
Resume home dose of ativan prn
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
## 2023-01-18 NOTE — ED Notes (Signed)
[facility] at [facility] will send transport for Bed Ready at [facility] 3W RM# 13.-ABB(NS)

## 2023-01-18 NOTE — ED Provider Notes (Signed)
Melissa Love   CSN: [id number] Arrival date & time: 01/18/23  4098     History {Add pertinent medical, surgical, social history, OB history to HPI:1} Chief Complaint  Patient presents with  . Hypertension    Melissa Love is a 87 y.o. female.  Patient is a 87 year old female with past medical history of anxiety, hypertension, hyperlipidemia, hypothyroidism, headaches, paroxysmal atrial tachycardia, SVT, and left vertebral artery near occlusion presenting for dizziness, headache, and difficulty ambulating.  Patient states she had a posterior headache last night, was hypertensive. Woke up at three am to go to the bathroom and felt "dizzy like I couldn't walk" stating she had to hold onto the wall so that she didn't fall. Also admits to nausea. States her bp at that time was 185/105-Pt is a retired Engineer, civil (consulting). Then proceeded to take Metoprolol Succ ER 25 mg, amlodipine 5mg , losartan potassium 50 mg at 3 AM. States she also took one of her prn Clonzapams bc she was so upset from her symptoms and aZofran 4mg  tablet. Patient states she has not had symptoms like this in the past with high blood pressure. She states she does not typically get headaches. She states she does not feel like she cannot walk with htn.   Chart review demonstrates patient was seen in May 2024 this year for similar symptoms and was found to have occlusion of the vertebral artery.  She was started on Plavix and aspirin after admission.  No surgical interventions at that time.  The history is provided by the patient. No language interpreter was used.  Hypertension Associated symptoms include headaches. Pertinent negatives include no chest pain, no abdominal pain and no shortness of breath.       Home Medications Prior to Admission medications   Medication Sig Start Date End Date Taking? Authorizing Provider  acetaminophen (TYLENOL) 650 MG CR tablet Take 650 mg by  mouth every 8 (eight) hours.    [provider]  amLODipine (NORVASC) 5 MG tablet Take 1 tablet (5 mg total) by mouth daily. 12/08/22   Rondel Baton, MD  aspirin EC 81 MG tablet Take 1 tablet (81 mg total) by mouth daily. Swallow whole. 07/23/22   Ghimire, Werner Lean, MD  calcium carbonate (TUMS - DOSED IN MG ELEMENTAL CALCIUM) 500 MG chewable tablet Chew 1 tablet by mouth daily as needed for indigestion or heartburn.    [provider]  cephALEXin (KEFLEX) 250 MG capsule Take 250 mg by mouth daily.    [provider]  cetirizine (ZYRTEC) 10 MG tablet Take 10 mg by mouth daily.    [provider]  cloNIDine (CATAPRES) 0.1 MG tablet Take 1 tablet (0.1 mg total) by mouth daily as needed (Systolic 180 or above and or Diastolic 100 or above). 12/11/22   Flossie Dibble, NP  Coenzyme Q10 (COQ10) 100 MG CAPS Take 100 mg by mouth daily.    [provider]  fluticasone (FLONASE) 50 MCG/ACT nasal spray Place 2 sprays into both nostrils daily.    [provider]  gabapentin (NEURONTIN) 100 MG capsule Take 100 mg by mouth 2 (two) times daily.    [provider]  levothyroxine (SYNTHROID) 25 MCG tablet Take 25 mcg by mouth daily before breakfast.    [provider]  LORazepam (ATIVAN) 0.5 MG tablet Take 0.5 mg by mouth at bedtime.    [provider]  losartan (COZAAR) 50 MG tablet Take 1 tablet (50  mg total) by mouth in the morning and at bedtime. 12/03/22   Baldo Daub, MD  Magnesium 200 MG TABS Take 200 mg by mouth daily as needed (Muscle cramps).    [provider]  metoprolol succinate (TOPROL XL) 25 MG 24 hr tablet Take 1 tablet (25 mg total) by mouth daily. 08/02/22   Baldo Daub, MD  Multiple Vitamins-Minerals (CENTRUM SILVER PO) Take 1 tablet by mouth daily.    [provider]  Multiple Vitamins-Minerals (PRESERVISION AREDS 2) CAPS Take 1 capsule by mouth daily.    [provider]   Polyethyl Glycol-Propyl Glycol (SYSTANE FREE OP) Place 1 drop into both eyes as needed (Dry eye).    [provider]  Ranibizumab (LUCENTIS) 0.5 MG/0.05ML SOSY by Intravitreal route.    [provider]  sertraline (ZOLOFT) 50 MG tablet Take 50 mg by mouth at bedtime.    [provider]  simvastatin (ZOCOR) 20 MG tablet Take 20 mg by mouth daily at 6 PM. 12/04/22   [provider]      Allergies    Hiprex [methenamine], Morphine, Penicillins, Sulfa antibiotics, and Sulfamethoxazole-trimethoprim    Review of Systems   Review of Systems  Constitutional:  Negative for chills and fever.  HENT:  Negative for ear pain and sore throat.   Eyes:  Negative for pain and visual disturbance.  Respiratory:  Negative for cough and shortness of breath.   Cardiovascular:  Negative for chest pain and palpitations.  Gastrointestinal:  Negative for abdominal pain and vomiting.  Genitourinary:  Negative for dysuria and hematuria.  Musculoskeletal:  Positive for gait problem. Negative for arthralgias and back pain.  Skin:  Negative for color change and rash.  Neurological:  Positive for dizziness, light-headedness and headaches. Negative for seizures and syncope.  All other systems reviewed and are negative.   Physical Exam Updated Vital Signs BP (!) 151/77   Pulse 75   Temp 97.8 F (36.6 C) (Oral)   Resp 20   SpO2 93%  Physical Exam Vitals and nursing Love reviewed.  Constitutional:      General: She is not in acute distress.    Appearance: She is well-developed.  HENT:     Head: Normocephalic and atraumatic.  Eyes:     Conjunctiva/sclera: Conjunctivae normal.  Cardiovascular:     Rate and Rhythm: Normal rate and regular rhythm.     Heart sounds: No murmur heard. Pulmonary:     Effort: Pulmonary effort is normal. No respiratory distress.     Breath sounds: Normal breath sounds.  Abdominal:     Palpations: Abdomen is soft.     Tenderness: There is no  abdominal tenderness.  Musculoskeletal:        General: No swelling.     Cervical back: Neck supple.  Skin:    General: Skin is warm and dry.     Capillary Refill: Capillary refill takes less than 2 seconds.  Neurological:     Mental Status: She is alert.  Psychiatric:        Mood and Affect: Mood normal.    ED Results / Procedures / Treatments   Labs (all labs ordered are listed, but only abnormal results are displayed) Labs Reviewed  URINALYSIS, ROUTINE W REFLEX MICROSCOPIC - Abnormal; Notable for the following components:      Result Value   Protein, ur TRACE (*)    Leukocytes,Ua TRACE (*)    All other components within normal limits  ETHANOL  PROTIME-INR  APTT  CBC  DIFFERENTIAL  COMPREHENSIVE METABOLIC PANEL    EKG None  Radiology CT ANGIO HEAD NECK W WO CM  Result Date: 01/18/2023 CLINICAL DATA:  Syncope/presyncope.  High blood pressure. EXAM: CT ANGIOGRAPHY HEAD AND NECK WITH AND WITHOUT CONTRAST TECHNIQUE: Multidetector CT imaging of the head and neck was performed using the standard protocol during bolus administration of intravenous contrast. Multiplanar CT image reconstructions and MIPs were obtained to evaluate the vascular anatomy. Carotid stenosis measurements (when applicable) are obtained utilizing NASCET criteria, using the distal internal carotid diameter as the denominator. RADIATION DOSE REDUCTION: This exam was performed according to the departmental dose-optimization program which includes automated exposure control, adjustment of the mA and/or kV according to patient size and/or use of iterative reconstruction technique. CONTRAST:  80mL OMNIPAQUE IOHEXOL 350 MG/ML SOLN COMPARISON:  08/30/2022 FINDINGS: CT HEAD FINDINGS Brain: No evidence of acute infarction, hemorrhage, hydrocephalus, extra-axial collection or mass lesion/mass effect. Mild chronic small vessel ischemia in the cerebral white matter. Age normal brain volume. Vascular: No hyperdense vessel or  unexpected calcification. Skull: Normal. Negative for fracture or focal lesion. Sinuses/Orbits: No acute finding. Review of the MIP images confirms the above findings CTA NECK FINDINGS Aortic arch: Atheromatous plaque with 3 vessel branching. Right carotid system: Vessels are smoothly contoured and widely patent. No significant atheromatous change Left carotid system: Vessels are smoothly contoured and widely patent. No significant atheromatous change Vertebral arteries: Proximal subclavian atherosclerosis on the left due to mixed density plaque measuring 70% stenosis. Narrowing at the left distal V2 segment with faint flow at the V3 segment. The V4 segment is enhancing more robustly, presumably retrograde contribution. Skeleton: Fusion at C2-3 and C3-4. Prominent degenerative endplate and facet spurring elsewhere. Other neck: No significant finding for age. Upper chest: Negative Review of the MIP images confirms the above findings CTA HEAD FINDINGS Anterior circulation: No significant stenosis, proximal occlusion, aneurysm, or vascular malformation. Atheromatous plaque along the carotid siphons Posterior circulation: Right vertebral artery dominance with relatively hypoenhancing left vertebral artery noted above. No branch occlusion, beading, or aneurysm Venous sinuses: Diffusely patent Anatomic variants: As above Review of the MIP images confirms the above findings IMPRESSION: 1. No emergent finding. 2. 70% atheromatous narrowing the proximal left subclavian. Severe stenosis of the distal left V2 segment with downstream underfilling of robust flow in the intracranial posterior circulation. 3. Carotid and intracranial atherosclerosis is less than typical for age. Electronically Signed   By: Tiburcio Pea M.D.   On: 01/18/2023 11:09    Procedures .Critical Care  Performed by: Franne Forts, DO Authorized by: Franne Forts, DO   Critical care provider statement:    Critical care time (minutes):  100    Critical care was necessary to treat or prevent imminent or life-threatening deterioration of the following conditions:  Shock   Critical care was time spent personally by me on the following activities:  Development of treatment plan with patient or surrogate, discussions with consultants, evaluation of patient's response to treatment, examination of patient, ordering and review of laboratory studies, ordering and review of radiographic studies, ordering and performing treatments and interventions, pulse oximetry, re-evaluation of patient's condition and review of old charts   Care discussed with comment:  Vascular surgery   {Document cardiac monitor, telemetry assessment procedure when appropriate:1}  Medications Ordered in ED Medications  iohexol (OMNIPAQUE) 350 MG/ML injection 80 mL (80 mLs Intravenous Contrast Given 01/18/23 1026)    ED Course/ Medical Decision Making/ A&P   {  Click here for ABCD2, HEART and other calculatorsREFRESH Love before signing :1}                              Medical Decision Making Amount and/or Complexity of Data Reviewed Labs: ordered. Radiology: ordered.  Risk Prescription drug management.   16:36 AM 87 year old female with past medical history of anxiety, hypertension, hyperlipidemia, hypothyroidism, headaches, paroxysmal atrial tachycardia, SVT, and left vertebral artery near occlusion presenting for dizziness, headache, and difficulty ambulating.  Patient is alert oriented x 3, no acute distress, afebrile, stable vital signs.  Blood pressure 151/77.  Chart review demonstrates patient was seen in May 2024 this year for similar symptoms and was found to have occlusion of the vertebral artery.  She was started on Plavix and aspirin after admission.  No surgical interventions at that time.  Differential diagnosis includes but is not limited to stroke, orthostatic hypotension, somatic hypertensive urgency versus hypertensive emergency, ACS, repeat of  patient's vertebral artery stenosis, etc.  Concerns for strokelike symptoms versus reocclusion of the vertebral artery.  Patient outside stroke activation window.  Will obtain CT head without and CT head and neck with contrast.  Nursing team notified.  CT notified.  Stable laboratory studies.  ECG demonstrates sinus rhythm with stable troponins.  Doubt cardiac etiology resulting in dizziness.  CT head without contrast demonstrates no acute process.    CT head with contrast demonstrate: 1. No acute intracranial abnormality. 2. Overall unchanged appearance of the left vertebral artery with asymmetrically decreased opacification of the left vertebral artery, which is occluded at the distal V2 segment. 3. Moderate narrowing of the origin of the left subclavian artery, unchanged. 4. No intracranial large vessel occlusion or significant stenosis. 5. Redemonstrated 15 mm right thyroid nodule. Recommend further evaluation with a thyroid ultrasound, if not previously performed.   Consult to vascular surgery placed.  I spoke with on-call vascular surgeon who states this is likely a very chronic problem and not resulting in patient's symptomatology.  We will plan for transfer to [facility] for MRI.  Vascular surgery would be more than happy to physically evaluate the patient if requested at that time.   {Document critical care time when appropriate:1} {Document review of labs and clinical decision tools ie heart score, Chads2Vasc2 etc:1}  {Document your independent review of radiology images, and any outside records:1} {Document your discussion with family members, caretakers, and with consultants:1} {Document social determinants of health affecting pt's care:1} {Document your decision making why or why not admission, treatments were needed:1} Final Clinical Impression(s) / ED Diagnoses Final diagnoses:  Vertebral artery stenosis, unspecified laterality  Left subclavian artery narrowing   Hypertensive urgency  Dizziness  Gait abnormality  Acute posterior nonintractable headache, unspecified headache type    Rx / DC Orders ED Discharge Orders     None

## 2023-01-18 NOTE — Subjective & Objective (Signed)
Patient has been having elevated blood pressure up to 180s recently started on as needed clonidine Recently started to feel off balance and having difficulty walking she checked her blood pressure and it was 180 she took a clonidine and blood pressure dropped down into 1/17 she still felt off balance Around 3 AM she woke up nauseous and had severe trouble walking her blood pressure at that time was back up to 180s over 100 She took her morning medicines but still felt the same and nauseous and presented to urgent care Reports associated posterior headache reports she has to hold onto the wall so she does not fall down States she have had symptoms like this last time she had blood pressure elevation usually does not get headaches She has known history of vertebral artery occlusion diagnosed in May 2024 and was started on Plavix and aspirin but no surgical intervention at the time

## 2023-01-18 NOTE — Assessment & Plan Note (Signed)
In the setting of hypertension now improved

## 2023-01-18 NOTE — Assessment & Plan Note (Addendum)
MRI pending to eval for CVA vs PRESS BP now under beter control resume home meds would avoid clonidine given hypotension  If MRI shows CVA will need permissive HTN

## 2023-01-18 NOTE — ED Triage Notes (Signed)
Pt has been seen in the past for high blood pressure, her primary has been monitoring and did add a prn clonidine for sys >180. Pt has had  no issues til yesterday. Pt was feeling off balance, hard to walk, took bp and was elevated 180's, pt took clonidine, bp was down to the 117's, but pt still felt off balance. At 0300 pt awoke, nauseated, hardly walk to br due to off balance, bp elevated again in 180's/100. She took her morning pills but still feeling off balance and nauseated.

## 2023-01-18 NOTE — ED Notes (Signed)
Pt instructed to doff clothes.don gown and take off any jewelry

## 2023-01-18 NOTE — Assessment & Plan Note (Signed)
Pt is on chronic Keflex will continue

## 2023-01-18 NOTE — Progress Notes (Signed)
Patient received from [facility] ED, alert and oriented X4, vital signs stable, tele box connected, call bell within reach, bed alarm on, will continue to monitor

## 2023-01-19 ENCOUNTER — Observation Stay (HOSPITAL_BASED_OUTPATIENT_CLINIC_OR_DEPARTMENT_OTHER): Payer: Medicare Other

## 2023-01-19 DIAGNOSIS — I771 Stricture of artery: Secondary | ICD-10-CM

## 2023-01-19 DIAGNOSIS — E039 Hypothyroidism, unspecified: Secondary | ICD-10-CM

## 2023-01-19 DIAGNOSIS — I6509 Occlusion and stenosis of unspecified vertebral artery: Secondary | ICD-10-CM

## 2023-01-19 DIAGNOSIS — R29818 Other symptoms and signs involving the nervous system: Secondary | ICD-10-CM | POA: Diagnosis not present

## 2023-01-19 DIAGNOSIS — I160 Hypertensive urgency: Secondary | ICD-10-CM

## 2023-01-19 DIAGNOSIS — Z96642 Presence of left artificial hip joint: Secondary | ICD-10-CM | POA: Diagnosis not present

## 2023-01-19 DIAGNOSIS — G459 Transient cerebral ischemic attack, unspecified: Secondary | ICD-10-CM | POA: Diagnosis not present

## 2023-01-19 DIAGNOSIS — Z87440 Personal history of urinary (tract) infections: Secondary | ICD-10-CM

## 2023-01-19 DIAGNOSIS — I5030 Unspecified diastolic (congestive) heart failure: Secondary | ICD-10-CM | POA: Diagnosis not present

## 2023-01-19 DIAGNOSIS — Z87891 Personal history of nicotine dependence: Secondary | ICD-10-CM | POA: Diagnosis not present

## 2023-01-19 DIAGNOSIS — F419 Anxiety disorder, unspecified: Secondary | ICD-10-CM | POA: Diagnosis not present

## 2023-01-19 DIAGNOSIS — Z79899 Other long term (current) drug therapy: Secondary | ICD-10-CM | POA: Diagnosis not present

## 2023-01-19 DIAGNOSIS — I708 Atherosclerosis of other arteries: Secondary | ICD-10-CM | POA: Diagnosis not present

## 2023-01-19 DIAGNOSIS — E785 Hyperlipidemia, unspecified: Secondary | ICD-10-CM

## 2023-01-19 DIAGNOSIS — Z7982 Long term (current) use of aspirin: Secondary | ICD-10-CM | POA: Diagnosis not present

## 2023-01-19 DIAGNOSIS — R519 Headache, unspecified: Secondary | ICD-10-CM | POA: Diagnosis not present

## 2023-01-19 DIAGNOSIS — I6502 Occlusion and stenosis of left vertebral artery: Secondary | ICD-10-CM

## 2023-01-19 DIAGNOSIS — I110 Hypertensive heart disease with heart failure: Secondary | ICD-10-CM | POA: Diagnosis not present

## 2023-01-19 LAB — COMPREHENSIVE METABOLIC PANEL
ALT: 33 U/L (ref 0–44)
AST: 15 U/L (ref 15–41)
Albumin: 3.5 g/dL (ref 3.5–5.0)
Alkaline Phosphatase: 66 U/L (ref 38–126)
Anion gap: 10 (ref 5–15)
BUN: 10 mg/dL (ref 8–23)
CO2: 26 mmol/L (ref 22–32)
Calcium: 8.8 mg/dL — ABNORMAL LOW (ref 8.9–10.3)
Chloride: 103 mmol/L (ref 98–111)
Creatinine, Ser: 0.64 mg/dL (ref 0.44–1.00)
GFR, Estimated: >60 mL/min (ref >60)
Glucose, Bld: 83 mg/dL (ref 70–99)
Potassium: 3.5 mmol/L (ref 3.5–5.1)
Sodium: 139 mmol/L (ref 135–145)
Total Bilirubin: 0.7 mg/dL (ref 0.3–1.2)
Total Protein: 5.9 g/dL — ABNORMAL LOW (ref 6.5–8.1)

## 2023-01-19 LAB — LIPID PANEL
Cholesterol: 163 mg/dL (ref 0–200)
HDL: 81 mg/dL (ref >40)
LDL Cholesterol: 69 mg/dL (ref 0–99)
Total CHOL/HDL Ratio: 2 ratio
Triglycerides: 67 mg/dL (ref <150)
VLDL: 13 mg/dL (ref 0–40)

## 2023-01-19 LAB — HEMOGLOBIN A1C
Hgb A1c MFr Bld: 5.8 % — ABNORMAL HIGH (ref 4.8–5.6)
Mean Plasma Glucose: 119.76 mg/dL

## 2023-01-19 LAB — CBC
HCT: 39.7 % (ref 36.0–46.0)
Hemoglobin: 12.7 g/dL (ref 12.0–15.0)
MCH: 30.4 pg (ref 26.0–34.0)
MCHC: 32 g/dL (ref 30.0–36.0)
MCV: 95 fL (ref 80.0–100.0)
Platelets: 176 10*3/uL (ref 150–400)
RBC: 4.18 MIL/uL (ref 3.87–5.11)
RDW: 14.7 % (ref 11.5–15.5)
WBC: 6 10*3/uL (ref 4.0–10.5)
nRBC: 0 % (ref 0.0–0.2)

## 2023-01-19 LAB — ECHOCARDIOGRAM COMPLETE BUBBLE STUDY
Area-P 1/2: 3.36 cm2
P 1/2 time: 1429 ms
S' Lateral: 3.3 cm

## 2023-01-19 LAB — MAGNESIUM: Magnesium: 2.4 mg/dL (ref 1.7–2.4)

## 2023-01-19 LAB — PHOSPHORUS: Phosphorus: 4.4 mg/dL (ref 2.5–4.6)

## 2023-01-19 MED ORDER — SIMVASTATIN 40 MG PO TABS: 40.0000 mg | ORAL_TABLET | Freq: Every day | ORAL | 2 refills | Status: AC

## 2023-01-19 MED ORDER — ENOXAPARIN SODIUM 40 MG/0.4ML IJ SOSY
40.0000 mg | PREFILLED_SYRINGE | INTRAMUSCULAR | Status: DC
  Administered 2023-01-19: 40 mg via SUBCUTANEOUS
  Filled 2023-01-19: qty 0.4

## 2023-01-19 MED ORDER — CLOPIDOGREL BISULFATE 75 MG PO TABS
75.0000 mg | ORAL_TABLET | Freq: Every day | ORAL | Status: DC
  Administered 2023-01-19: 75 mg via ORAL
  Filled 2023-01-19: qty 1

## 2023-01-19 MED ORDER — SIMVASTATIN 20 MG PO TABS: 40.0000 mg | ORAL_TABLET | Freq: Every day | ORAL | Status: DC

## 2023-01-19 MED ORDER — CLOPIDOGREL BISULFATE 75 MG PO TABS: 75.0000 mg | ORAL_TABLET | Freq: Every day | ORAL | 0 refills | Status: DC

## 2023-01-19 NOTE — Progress Notes (Signed)
AVS instructions given, patient verbalized understanding, dropped to daughter's car to main entrance A

## 2023-01-19 NOTE — Discharge Summary (Signed)
Physician Discharge Summary  MORAIMA ARBOGAST OZH:[id number] DOB: 1936-05-19 DOA: 01/18/2023  PCP: Wilmer Floor., MD  Admit date: 01/18/2023 Discharge date: 01/19/2023 Admitted From: Home Disposition: Home Recommendations for Outpatient Follow-up:  Follow up with PCP in 1 to 2 weeks Neurology to arrange outpatient follow-up with neuro IR for further evaluation of left subclavian and vertebral artery stenosis Consider referral to sleep clinic to rule out sleep apnea Reassess BP, CBC and CMP at follow-up Please follow up on the following pending results: Hemoglobin A1c  Home Health: None Equipment/Devices: None  Discharge Condition: Stable CODE STATUS: Full code  Follow-up Information     Wilmer Floor., MD. Schedule an appointment as soon as possible for a visit in 1 week(s).   Specialty: Internal Medicine Contact information: [address] [place] [address] [phone]         Anson Fret, MD. Schedule an appointment as soon as possible for a visit in 1 month(s).   Specialty: Neurology Contact information: [address] [place] [address] [phone]                 Hospital course 87 year old F with PMH of diastolic CHF not on diuretics, HTN, HLD, left V2 stenosis, hypothyroidism, chronic UTI, anxiety and osteoarthritis presenting with feeling off balance and labile blood pressure and admitted for CVA workup.  Reportedly had elevated BP to 190/90 when she woke up on 8/16.  She took a BP meds and BP dropped to 115/50 about lunchtime but improved to 140/90 in the evening.  Woke up with similar symptoms on 8/17 and BP was elevated to 185/75, and she decided to come to ED.  She has no focal numbness or weakness, tingling, slurred speech, facial droop or ataxia.  Denies vertigo either.   In ED, vital stable.  CMP, CBC, UA and CT head without acute finding.  CT angio head and neck with no emergent finding but 70%  atheromatous narrowing of the proximal left subclavian and severe stenosis of distal left V2 segment with downstream underfilling of robust flow in the intracranial posterior circulation.  MRI ordered.  Neurology and vascular surgery consulted.   MRI brain without acute finding.  No further neurosymptoms.  TTE without significant finding.  Blood pressure stable.  Neurology, Dr. Roda Shutters discussed about CT angio finding with neuro IR who recommended further evaluation with angiography and intervention as appropriate.  However, patient declined further inpatient evaluation and decided to follow-up outpatient.  Neurology recommended aspirin and Plavix for 3 months and increasing Zocor to 40 mg daily.  Neurology to arrange outpatient follow-up with neuro IR.   See individual problem list below for more.   Problems addressed during this hospitalization Principal Problem:   Acute focal neurological deficit Active Problems:   Anxiety   Headache   Hypothyroidism   Hyperlipidemia   History of recurrent UTIs   Hypertensive urgency   Stenosis of left subclavian artery (HCC)   Symptomatic stenosis of left vertebral artery without infarction   Acute focal neurological deficit: Concerning for vertebrobasilar insufficiency in the setting of left subclavian stenosis supplying already stenosed left vertebral artery.  She presents due to feeling off balance and labile blood pressure.  No other focal neurosymptoms.  No ataxia.  MRI negative for acute stroke.  LDL 69. Neurology discussed with neuro IR who recommended angiogram with possible intervention but patient wants to follow-up outpatient.  Neurology to arrange outpatient follow-up with neuro IR.  Neurology  recommended Plavix and aspirin for 3 months and increasing Zocor to 40 mg daily.  Patient has been cleared by therapy.  Hemoglobin A1c pending at time of discharge.  No hyperglycemia on basic labs.   Symptomatic left subclavian and left V2 stenosis without acute  stroke -Management as above.   Uncontrolled labile blood pressure: Could be due to left subclavian stenosis.  Blood pressure stable here.  Orthostatic vitals negative.  TTE without significant finding. -Consider outpatient referral to sleep clinic to rule out sleep apnea -Continue home antihypertensive meds   Headache: Resolved.  In the setting of labile blood pressure.   Hyperlipidemia: LDL 66 -Zocor increased to 40 mg daily per recommendation by neurology   Anxiety: Stable -Continue home gabapentin and Ativan   History of recurrent UTIs Pt is on chronic Keflex will continue   Hypothyroidism -Continue home Synthroid           Time spent 35 minutes  Vital signs Vitals:   01/19/23 0431 01/19/23 0814 01/19/23 1223 01/19/23 1540  BP: (!) 143/67 (!) 151/76 133/67 126/61  Pulse: 79 79 75 77  Temp: 98.4 F (36.9 C) 99 F (37.2 C) 98.2 F (36.8 C) 98.2 F (36.8 C)  Resp: 18   18  SpO2: 93% 96% 99% 96%  TempSrc: Oral Oral Oral Oral     Discharge exam GENERAL: No apparent distress.  Nontoxic. HEENT: MMM.  Vision and hearing grossly intact.  NECK: Supple.  No apparent JVD.  RESP:  No IWOB.  Fair aeration bilaterally. CVS:  RRR. Heart sounds normal.  ABD/GI/GU: BS+. Abd soft, NTND.  MSK/EXT:  Moves extremities. No apparent deformity. No edema.  SKIN: no apparent skin lesion or wound NEURO: Awake, alert and oriented appropriately. Speech clear. Cranial nerves II-XII intact.  No nystagmus.  Motor 5/5 in all muscle groups of UE and LE bilaterally, Normal tone. Light sensation intact in all dermatomes of upper and lower ext bilaterally. Patellar reflex symmetric.  No pronator drift.  Finger to nose intact. PSYCH: Calm. Normal affect.  Discharge Instructions Discharge Instructions     Ambulatory referral to Neurology   Complete by: As directed    Follow up with Dr. Lucia Gaskins at [facility] in 4 weeks. Pt is Dr. Trevor Mace pt. Thanks.   Diet - low sodium heart healthy   Complete by: As  directed    Discharge instructions   Complete by: As directed    It has been a pleasure taking care of you!  You were hospitalized due to feeling off balance, nausea and elevated blood pressure.  Your MRI is negative for stroke.  CT scan showed narrowing of blood vessel in left neck area which may contribute to your symptoms and labile blood pressure.  Neurology will arrange outpatient follow-up with vascular interventionist for further evaluation on this.  We recommend checking your blood pressure on your right arm.  We have started you on Plavix and increased your simvastatin.  You may try taking you amlodipine and metoprolol before bedtime.  Continue checking your blood pressure per our discussion and keep blood pressure log.  Always implement stepwise approach when you get up and move.   Follow-up with your primary care doctor and cardiologist in 1 to 2 weeks or sooner if needed.  Follow-up with your neurologist as well.   Increase activity slowly   Complete by: As directed       Allergies as of 01/19/2023       Reactions   Hiprex [methenamine] Rash, Other (  See Comments)   Fever, Chills   Morphine Swelling   Penicillins Rash   Sulfa Antibiotics Rash   Sulfamethoxazole-trimethoprim Nausea Only, Rash        Medication List     TAKE these medications    acetaminophen 650 MG CR tablet Commonly known as: TYLENOL Take 650 mg by mouth every 8 (eight) hours.   amLODipine 5 MG tablet Commonly known as: NORVASC Take 1 tablet (5 mg total) by mouth daily.   Aspirin Low Dose 81 MG tablet Generic drug: aspirin EC Take 1 tablet (81 mg total) by mouth daily. Swallow whole.   calcium carbonate 500 MG chewable tablet Commonly known as: TUMS - dosed in mg elemental calcium Chew 1 tablet by mouth daily as needed for indigestion or heartburn.   cephALEXin 250 MG capsule Commonly known as: KEFLEX Take 250 mg by mouth daily.   cetirizine 10 MG tablet Commonly known as: ZYRTEC Take  10 mg by mouth daily.   cloNIDine 0.1 MG tablet Commonly known as: Catapres Take 1 tablet (0.1 mg total) by mouth daily as needed (Systolic 180 or above and or Diastolic 100 or above).   clopidogrel 75 MG tablet Commonly known as: Plavix Take 1 tablet (75 mg total) by mouth daily. Start taking on: January 20, 2023   CoQ10 100 MG Caps Take 100 mg by mouth daily.   fluticasone 50 MCG/ACT nasal spray Commonly known as: FLONASE Place 2 sprays into both nostrils daily.   gabapentin 100 MG capsule Commonly known as: NEURONTIN Take 100 mg by mouth 2 (two) times daily.   levothyroxine 25 MCG tablet Commonly known as: SYNTHROID Take 25 mcg by mouth daily before breakfast.   LORazepam 0.5 MG tablet Commonly known as: ATIVAN Take 0.5 mg by mouth at bedtime.   losartan 50 MG tablet Commonly known as: COZAAR Take 1 tablet (50 mg total) by mouth in the morning and at bedtime.   Lucentis 0.5 MG/0.05ML Sosy Generic drug: Ranibizumab by Intravitreal route.   Magnesium 200 MG Tabs Take 200 mg by mouth daily as needed (Muscle cramps).   metoprolol succinate 25 MG 24 hr tablet Commonly known as: Toprol XL Take 1 tablet (25 mg total) by mouth daily.   PreserVision AREDS 2 Caps Take 1 capsule by mouth daily.   CENTRUM SILVER PO Take 1 tablet by mouth daily.   sertraline 50 MG tablet Commonly known as: ZOLOFT Take 50 mg by mouth at bedtime.   simvastatin 40 MG tablet Commonly known as: ZOCOR Take 1 tablet (40 mg total) by mouth daily at 6 PM. What changed:  medication strength how much to take   SYSTANE FREE OP Place 1 drop into both eyes as needed (Dry eye).        Consultations: Neurology   Procedures/Studies:   ECHOCARDIOGRAM COMPLETE BUBBLE STUDY  Result Date: 01/19/2023    ECHOCARDIOGRAM REPORT   Patient Name:   TANAIJAH KINZLER Date of Exam: 01/19/2023 Medical Rec #:  [id number]       Height:       62.0 in Accession #:    [id number]      Weight:       168.6 lb  Date of Birth:  [birth year]       BSA:          1.778 m Patient Age:    87 years        BP:           143/67 mmHg Patient  Gender: F               HR:           75 bpm. Exam Location:  Inpatient Procedure: 2D Echo, Color Doppler, Cardiac Doppler and Saline Contrast Bubble            Study Indications:    Stroke I63.9  History:        Patient has prior history of Echocardiogram examinations, most                 recent 07/23/2022. Risk Factors:Hypertension.  Sonographer:    Harriette Bouillon RDCS Referring Phys: [id number] ANASTASSIA DOUTOVA IMPRESSIONS  1. Left ventricular ejection fraction, by estimation, is 60 to 65%. The left ventricle has normal function. The left ventricle has no regional wall motion abnormalities. Left ventricular diastolic parameters are consistent with Grade I diastolic dysfunction (impaired relaxation).  2. Right ventricular systolic function is normal. The right ventricular size is normal.  3. The mitral valve is normal in structure. No evidence of mitral valve regurgitation. No evidence of mitral stenosis.  4. The aortic valve is tricuspid. Aortic valve regurgitation is mild. No aortic stenosis is present.  5. The inferior vena cava is normal in size with greater than 50% respiratory variability, suggesting right atrial pressure of 3 mmHg.  6. Agitated saline contrast bubble study was negative, with no evidence of any interatrial shunt. FINDINGS  Left Ventricle: Left ventricular ejection fraction, by estimation, is 60 to 65%. The left ventricle has normal function. The left ventricle has no regional wall motion abnormalities. The left ventricular internal cavity size was normal in size. There is  no left ventricular hypertrophy. Left ventricular diastolic parameters are consistent with Grade I diastolic dysfunction (impaired relaxation). Right Ventricle: The right ventricular size is normal. Right ventricular systolic function is normal. Left Atrium: Left atrial size was normal in size. Right Atrium:  Right atrial size was normal in size. Pericardium: There is no evidence of pericardial effusion. Mitral Valve: The mitral valve is normal in structure. No evidence of mitral valve regurgitation. No evidence of mitral valve stenosis. Tricuspid Valve: The tricuspid valve is normal in structure. Tricuspid valve regurgitation is trivial. No evidence of tricuspid stenosis. Aortic Valve: The aortic valve is tricuspid. Aortic valve regurgitation is mild. Aortic regurgitation PHT measures 1429 msec. No aortic stenosis is present. Pulmonic Valve: The pulmonic valve was normal in structure. Pulmonic valve regurgitation is not visualized. No evidence of pulmonic stenosis. Aorta: The aortic root is normal in size and structure. Venous: The inferior vena cava is normal in size with greater than 50% respiratory variability, suggesting right atrial pressure of 3 mmHg. IAS/Shunts: No atrial level shunt detected by color flow Doppler. Agitated saline contrast was given intravenously to evaluate for intracardiac shunting. Agitated saline contrast bubble study was negative, with no evidence of any interatrial shunt.  LEFT VENTRICLE PLAX 2D LVIDd:         4.40 cm   Diastology LVIDs:         3.30 cm   LV e' medial:    6.42 cm/s LV PW:         0.90 cm   LV E/e' medial:  7.2 LV IVS:        0.90 cm   LV e' lateral:   8.92 cm/s LVOT diam:     2.00 cm   LV E/e' lateral: 5.2 LV SV:         49 LV SV Index:  28 LVOT Area:     3.14 cm  RIGHT VENTRICLE             IVC RV S prime:     11.30 cm/s  IVC diam: 1.70 cm TAPSE (M-mode): 1.8 cm LEFT ATRIUM           Index        RIGHT ATRIUM          Index LA diam:      3.00 cm 1.69 cm/m   RA Area:     8.27 cm LA Vol (A2C): 26.6 ml 14.96 ml/m  RA Volume:   15.60 ml 8.77 ml/m LA Vol (A4C): 19.0 ml 10.69 ml/m  AORTIC VALVE LVOT Vmax:   79.30 cm/s LVOT Vmean:  57.100 cm/s LVOT VTI:    0.157 m AI PHT:      1429 msec  AORTA Ao Root diam: 3.00 cm Ao Asc diam:  3.10 cm MITRAL VALVE MV Area (PHT): 3.36 cm     SHUNTS MV Decel Time: 226 msec    Systemic VTI:  0.16 m MV E velocity: 46.30 cm/s  Systemic Diam: 2.00 cm MV A velocity: 78.40 cm/s MV E/A ratio:  0.59 Olga Millers MD Electronically signed by Olga Millers MD Signature Date/Time: 01/19/2023/2:12:01 PM    Final    DG CHEST PORT 1 VIEW  Result Date: 01/18/2023 CLINICAL DATA:  CVA. EXAM: PORTABLE CHEST 1 VIEW COMPARISON:  06/21/2020. FINDINGS: The heart size and mediastinal contours are within normal limits. There is atherosclerotic calcification of the aorta. No consolidation, effusion, or pneumothorax. Shoulder arthroplasty changes are noted on the right. No acute osseous abnormality. IMPRESSION: No active disease. Electronically Signed   By: Thornell Sartorius M.D.   On: 01/18/2023 23:46   MR BRAIN WO CONTRAST  Result Date: 01/18/2023 CLINICAL DATA:  Initial evaluation for neuro deficit, stroke suspected. EXAM: MRI HEAD WITHOUT CONTRAST TECHNIQUE: Multiplanar, multiecho pulse sequences of the brain and surrounding structures were obtained without intravenous contrast. COMPARISON:  Prior studies from earlier the same day. FINDINGS: Brain: Cerebral volume within normal limits for age. Scattered patchy T2/FLAIR hyperintensity involving the periventricular deep white matter both cerebral hemispheres, consistent with chronic small vessel ischemic disease, moderate in nature. No evidence for acute or subacute ischemia. Gray-white matter differentiation maintained. No areas of chronic cortical infarction. No acute or chronic intracranial blood products. No mass lesion, midline shift or mass effect. No hydrocephalus or extra-axial fluid collection. Pituitary gland and suprasellar region within normal limits. Vascular: Major intracranial vascular flow voids are maintained. Skull and upper cervical spine: Craniocervical junction within normal limits. Bone marrow signal intensity normal. No scalp soft tissue abnormality. Sinuses/Orbits: Prior bilateral ocular lens  replacement. Paranasal sinuses are clear. No mastoid effusion. Other: None. IMPRESSION: 1. No acute intracranial abnormality. 2. Moderate chronic microvascular ischemic disease. Electronically Signed   By: Rise Mu M.D.   On: 01/18/2023 23:26   CT ANGIO HEAD NECK W WO CM  Result Date: 01/18/2023 CLINICAL DATA:  Syncope/presyncope.  High blood pressure. EXAM: CT ANGIOGRAPHY HEAD AND NECK WITH AND WITHOUT CONTRAST TECHNIQUE: Multidetector CT imaging of the head and neck was performed using the standard protocol during bolus administration of intravenous contrast. Multiplanar CT image reconstructions and MIPs were obtained to evaluate the vascular anatomy. Carotid stenosis measurements (when applicable) are obtained utilizing NASCET criteria, using the distal internal carotid diameter as the denominator. RADIATION DOSE REDUCTION: This exam was performed according to the departmental dose-optimization program which includes automated exposure  control, adjustment of the mA and/or kV according to patient size and/or use of iterative reconstruction technique. CONTRAST:  80mL OMNIPAQUE IOHEXOL 350 MG/ML SOLN COMPARISON:  08/30/2022 FINDINGS: CT HEAD FINDINGS Brain: No evidence of acute infarction, hemorrhage, hydrocephalus, extra-axial collection or mass lesion/mass effect. Mild chronic small vessel ischemia in the cerebral white matter. Age normal brain volume. Vascular: No hyperdense vessel or unexpected calcification. Skull: Normal. Negative for fracture or focal lesion. Sinuses/Orbits: No acute finding. Review of the MIP images confirms the above findings CTA NECK FINDINGS Aortic arch: Atheromatous plaque with 3 vessel branching. Right carotid system: Vessels are smoothly contoured and widely patent. No significant atheromatous change Left carotid system: Vessels are smoothly contoured and widely patent. No significant atheromatous change Vertebral arteries: Proximal subclavian atherosclerosis on the  left due to mixed density plaque measuring 70% stenosis. Narrowing at the left distal V2 segment with faint flow at the V3 segment. The V4 segment is enhancing more robustly, presumably retrograde contribution. Skeleton: Fusion at C2-3 and C3-4. Prominent degenerative endplate and facet spurring elsewhere. Other neck: No significant finding for age. Upper chest: Negative Review of the MIP images confirms the above findings CTA HEAD FINDINGS Anterior circulation: No significant stenosis, proximal occlusion, aneurysm, or vascular malformation. Atheromatous plaque along the carotid siphons Posterior circulation: Right vertebral artery dominance with relatively hypoenhancing left vertebral artery noted above. No branch occlusion, beading, or aneurysm Venous sinuses: Diffusely patent Anatomic variants: As above Review of the MIP images confirms the above findings IMPRESSION: 1. No emergent finding. 2. 70% atheromatous narrowing the proximal left subclavian. Severe stenosis of the distal left V2 segment with downstream underfilling of robust flow in the intracranial posterior circulation. 3. Carotid and intracranial atherosclerosis is less than typical for age. Electronically Signed   By: Tiburcio Pea M.D.   On: 01/18/2023 11:09       The results of significant diagnostics from this hospitalization (including imaging, microbiology, ancillary and laboratory) are listed below for reference.     Microbiology: No results found for this or any previous visit (from the past 240 hour(s)).   Labs:  CBC: Recent Labs  Lab 01/18/23 0926 01/19/23 0326  WBC 6.5 6.0  NEUTROABS 4.5  --   HGB 13.1 12.7  HCT 39.9 39.7  MCV 95.7 95.0  PLT 166 176   BMP &GFR Recent Labs  Lab 01/18/23 0926 01/18/23 1940 01/19/23 0326  NA 142  --  139  K 3.8  --  3.5  CL 105  --  103  CO2 28  --  26  GLUCOSE 98  --  83  BUN 11  --  10  CREATININE 0.58  --  0.64  CALCIUM 9.1  --  8.8*  MG  --  2.5* 2.4  PHOS  --  3.7  4.4   CrCl cannot be calculated (Unknown ideal weight.). Liver & Pancreas: Recent Labs  Lab 01/18/23 0926 01/19/23 0326  AST 15 15  ALT 33 33  ALKPHOS 68 66  BILITOT 0.7 0.7  PROT 6.5 5.9*  ALBUMIN 4.0 3.5   No results for input(s): "LIPASE", "AMYLASE" in the last 168 hours. No results for input(s): "AMMONIA" in the last 168 hours. Diabetic: Recent Labs    01/19/23 0326  HGBA1C 5.8*   No results for input(s): "GLUCAP" in the last 168 hours. Cardiac Enzymes: No results for input(s): "CKTOTAL", "CKMB", "CKMBINDEX", "TROPONINI" in the last 168 hours. No results for input(s): "PROBNP" in the last 8760 hours. Coagulation Profile: Recent  Labs  Lab 01/18/23 0926  INR 1.0   Thyroid Function Tests: Recent Labs    01/18/23 1940  TSH 1.638  FREET4 1.24*   Lipid Profile: Recent Labs    01/19/23 0326  CHOL 163  HDL 81  LDLCALC 69  TRIG 67  CHOLHDL 2.0   Anemia Panel: No results for input(s): "VITAMINB12", "FOLATE", "FERRITIN", "TIBC", "IRON", "RETICCTPCT" in the last 72 hours. Urine analysis:    Component Value Date/Time   COLORURINE YELLOW 01/18/2023 0926   APPEARANCEUR CLEAR 01/18/2023 0926   LABSPEC 1.023 01/18/2023 0926   PHURINE 6.0 01/18/2023 0926   GLUCOSEU NEGATIVE 01/18/2023 0926   HGBUR NEGATIVE 01/18/2023 0926   BILIRUBINUR NEGATIVE 01/18/2023 0926   KETONESUR NEGATIVE 01/18/2023 0926   PROTEINUR TRACE (A) 01/18/2023 0926   NITRITE NEGATIVE 01/18/2023 0926   LEUKOCYTESUR TRACE (A) 01/18/2023 0926   Sepsis Labs: Invalid input(s): "PROCALCITONIN", "LACTICIDVEN"   SIGNED:  Almon Hercules, MD  [facility] 01/19/2023, 4:31 PM
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
## 2023-01-19 NOTE — Progress Notes (Signed)
PROGRESS NOTE  Melissa Love ZOX:[id number] DOB: [birth year]   PCP: Wilmer Floor., MD  Patient is from: Home.  Lives alone.  Independently ambulates at baseline.  DOA: 01/18/2023 LOS: 0  Chief complaints Chief Complaint  Patient presents with   Hypertension     Brief Narrative / Interim history: 87 year old F with PMH of diastolic CHF not on diuretics, HTN, HLD, left V2 stenosis, hypothyroidism, chronic UTI, anxiety and osteoarthritis presenting with feeling off balance and labile blood pressure and admitted for CVA workup.  Reportedly had elevated BP to 190/90 when she woke up on 8/16.  She took a BP meds and BP dropped to 115/50 about lunchtime but improved to 140/90 in the evening.  Woke up with similar symptoms on 8/17 and BP was elevated to 185/75, and she decided to come to ED.  She has no focal numbness or weakness, tingling, slurred speech, facial droop or ataxia.  Denies vertigo either.  In ED, vital stable.  CMP, CBC, UA and CT head without acute finding.  CT angio head and neck with no emergent finding but 70% atheromatous narrowing of the proximal left subclavian and severe stenosis of distal left V2 segment with downstream underfilling of robust flow in the intracranial posterior circulation.  MRI ordered.  Neurology and vascular surgery consulted.  MRI brain without acute finding.  No further neurosymptoms.  TTE without significant finding.  Blood pressure stable.  Neurology discussed about subclavian with neuro IR.  Plan for angiogram and possible intervention   Subjective: Seen and examined earlier this morning.  No major events overnight of this morning.  No complaints.  Feels well.  Objective: Vitals:   01/19/23 0431 01/19/23 0814 01/19/23 1223 01/19/23 1540  BP: (!) 143/67 (!) 151/76 133/67 126/61  Pulse: 79 79 75 77  Resp: 18   18  Temp: 98.4 F (36.9 C) 99 F (37.2 C) 98.2 F (36.8 C) 98.2 F (36.8 C)  TempSrc: Oral Oral Oral Oral  SpO2: 93% 96% 99%  96%    Examination:  GENERAL: No apparent distress.  Nontoxic. HEENT: MMM.  Vision and hearing grossly intact.  NECK: Supple.  No apparent JVD.  RESP:  No IWOB.  Fair aeration bilaterally. CVS:  RRR. Heart sounds normal.  ABD/GI/GU: BS+. Abd soft, NTND.  MSK/EXT:  Moves extremities. No apparent deformity. No edema.  SKIN: no apparent skin lesion or wound NEURO: Awake, alert and oriented appropriately. Speech clear. Cranial nerves II-XII intact.  No nystagmus.  Motor 5/5 in all muscle groups of UE and LE bilaterally, Normal tone. Light sensation intact in all dermatomes of upper and lower ext bilaterally. Patellar reflex symmetric.  No pronator drift.  Finger to nose intact. PSYCH: Calm. Normal affect.   Procedures:  None  Microbiology summarized: None  Assessment and plan: Principal Problem:   Acute focal neurological deficit Active Problems:   Anxiety   Headache   Hypothyroidism   Hyperlipidemia   History of recurrent UTIs   Hypertensive urgency  Acute focal neurological deficit: Concerning for vertebrobasilar insufficiency in the setting of left subclavian stenosis supplying already stenosed left vertebral artery.  She presents due to feeling off balance and labile blood pressure.  No other focal neurosymptoms.  No ataxia.  MRI negative for acute stroke.  LDL 69. -Neurology discussed with neuro IR who recommended angiogram with possible intervention -Continue low-dose aspirin and home statin -Avoid hypotension -Cleared by PT/OT/SLP.  Left subclavian and left V2 stenosis -Management as above.   Uncontrolled labile  blood pressure: Could be due to left subclavian stenosis.  Blood pressure stable here.  Orthostatic vitals negative.  TTE without significant finding. -Consider outpatient referral to sleep clinic to rule out sleep apnea -Continue home antihypertensive meds  Headache: Resolved.  In the setting of labile blood pressure.   Hyperlipidemia Check lipid panel  continue Zocor 20 m gpo q day   Anxiety: Stable -Continue home gabapentin and Ativan   History of recurrent UTIs Pt is on chronic Keflex will continue   Hypothyroidism -Continue home Synthroid   There is no height or weight on file to calculate BMI.          DVT prophylaxis:  enoxaparin (LOVENOX) injection 40 mg Start: 01/19/23 1645 SCD's Start: 01/18/23 2035  Code Status: Full code Family Communication: None at bedside Level of care: Telemetry Medical Status is: Observation The patient will require care spanning > 2 midnights and should be moved to inpatient because: Acute neurologic deficit likely due to left subclavian and left vertebral artery stenosis and labile blood pressure   Final disposition: Home Consultants:  Neurology Neuro IR  35 minutes with more than 50% spent in reviewing records, counseling patient/family and coordinating care.   Sch Meds:  Scheduled Meds:  amLODipine  5 mg Oral Daily   aspirin EC  81 mg Oral Daily   cephALEXin  250 mg Oral Daily   enoxaparin (LOVENOX) injection  40 mg Subcutaneous Q24H   gabapentin  100 mg Oral BID   levothyroxine  25 mcg Oral QAC breakfast   LORazepam  0.5 mg Oral QHS   metoprolol succinate  25 mg Oral Daily   sertraline  50 mg Oral QHS   simvastatin  20 mg Oral q1800   Continuous Infusions: PRN Meds:.acetaminophen **OR** acetaminophen (TYLENOL) oral liquid 160 mg/5 mL **OR** acetaminophen  Antimicrobials: Anti-infectives (From admission, onward)    Start     Dose/Rate Route Frequency Ordered Stop   01/19/23 1000  cephALEXin (KEFLEX) capsule 250 mg        250 mg Oral Daily 01/18/23 2139          I have personally reviewed the following labs and images: CBC: Recent Labs  Lab 01/18/23 0926 01/19/23 0326  WBC 6.5 6.0  NEUTROABS 4.5  --   HGB 13.1 12.7  HCT 39.9 39.7  MCV 95.7 95.0  PLT 166 176   BMP &GFR Recent Labs  Lab 01/18/23 0926 01/18/23 1940 01/19/23 0326  NA 142  --  139  K  3.8  --  3.5  CL 105  --  103  CO2 28  --  26  GLUCOSE 98  --  83  BUN 11  --  10  CREATININE 0.58  --  0.64  CALCIUM 9.1  --  8.8*  MG  --  2.5* 2.4  PHOS  --  3.7 4.4   CrCl cannot be calculated (Unknown ideal weight.). Liver & Pancreas: Recent Labs  Lab 01/18/23 0926 01/19/23 0326  AST 15 15  ALT 33 33  ALKPHOS 68 66  BILITOT 0.7 0.7  PROT 6.5 5.9*  ALBUMIN 4.0 3.5   No results for input(s): "LIPASE", "AMYLASE" in the last 168 hours. No results for input(s): "AMMONIA" in the last 168 hours. Diabetic: No results for input(s): "HGBA1C" in the last 72 hours. No results for input(s): "GLUCAP" in the last 168 hours. Cardiac Enzymes: No results for input(s): "CKTOTAL", "CKMB", "CKMBINDEX", "TROPONINI" in the last 168 hours. No results for input(s): "PROBNP"  in the last 8760 hours. Coagulation Profile: Recent Labs  Lab 01/18/23 0926  INR 1.0   Thyroid Function Tests: Recent Labs    01/18/23 1940  TSH 1.638  FREET4 1.24*   Lipid Profile: Recent Labs    01/19/23 0326  CHOL 163  HDL 81  LDLCALC 69  TRIG 67  CHOLHDL 2.0   Anemia Panel: No results for input(s): "VITAMINB12", "FOLATE", "FERRITIN", "TIBC", "IRON", "RETICCTPCT" in the last 72 hours. Urine analysis:    Component Value Date/Time   COLORURINE YELLOW 01/18/2023 0926   APPEARANCEUR CLEAR 01/18/2023 0926   LABSPEC 1.023 01/18/2023 0926   PHURINE 6.0 01/18/2023 0926   GLUCOSEU NEGATIVE 01/18/2023 0926   HGBUR NEGATIVE 01/18/2023 0926   BILIRUBINUR NEGATIVE 01/18/2023 0926   KETONESUR NEGATIVE 01/18/2023 0926   PROTEINUR TRACE (A) 01/18/2023 0926   NITRITE NEGATIVE 01/18/2023 0926   LEUKOCYTESUR TRACE (A) 01/18/2023 0926   Sepsis Labs: Invalid input(s): "PROCALCITONIN", "LACTICIDVEN"  Microbiology: No results found for this or any previous visit (from the past 240 hour(s)).  Radiology Studies: ECHOCARDIOGRAM COMPLETE BUBBLE STUDY  Result Date: 01/19/2023    ECHOCARDIOGRAM REPORT   Patient  Name:   HONORE PENSE Date of Exam: 01/19/2023 Medical Rec #:  [id number]       Height:       62.0 in Accession #:    [id number]      Weight:       168.6 lb Date of Birth:  03-Jan-1936       BSA:          1.778 m Patient Age:    87 years        BP:           143/67 mmHg Patient Gender: F               HR:           75 bpm. Exam Location:  Inpatient Procedure: 2D Echo, Color Doppler, Cardiac Doppler and Saline Contrast Bubble            Study Indications:    Stroke I63.9  History:        Patient has prior history of Echocardiogram examinations, most                 recent 07/23/2022. Risk Factors:Hypertension.  Sonographer:    Harriette Bouillon RDCS Referring Phys: [id number] ANASTASSIA DOUTOVA IMPRESSIONS  1. Left ventricular ejection fraction, by estimation, is 60 to 65%. The left ventricle has normal function. The left ventricle has no regional wall motion abnormalities. Left ventricular diastolic parameters are consistent with Grade I diastolic dysfunction (impaired relaxation).  2. Right ventricular systolic function is normal. The right ventricular size is normal.  3. The mitral valve is normal in structure. No evidence of mitral valve regurgitation. No evidence of mitral stenosis.  4. The aortic valve is tricuspid. Aortic valve regurgitation is mild. No aortic stenosis is present.  5. The inferior vena cava is normal in size with greater than 50% respiratory variability, suggesting right atrial pressure of 3 mmHg.  6. Agitated saline contrast bubble study was negative, with no evidence of any interatrial shunt. FINDINGS  Left Ventricle: Left ventricular ejection fraction, by estimation, is 60 to 65%. The left ventricle has normal function. The left ventricle has no regional wall motion abnormalities. The left ventricular internal cavity size was normal in size. There is  no left ventricular hypertrophy. Left ventricular diastolic parameters are consistent with Grade I diastolic dysfunction (  impaired relaxation). Right  Ventricle: The right ventricular size is normal. Right ventricular systolic function is normal. Left Atrium: Left atrial size was normal in size. Right Atrium: Right atrial size was normal in size. Pericardium: There is no evidence of pericardial effusion. Mitral Valve: The mitral valve is normal in structure. No evidence of mitral valve regurgitation. No evidence of mitral valve stenosis. Tricuspid Valve: The tricuspid valve is normal in structure. Tricuspid valve regurgitation is trivial. No evidence of tricuspid stenosis. Aortic Valve: The aortic valve is tricuspid. Aortic valve regurgitation is mild. Aortic regurgitation PHT measures 1429 msec. No aortic stenosis is present. Pulmonic Valve: The pulmonic valve was normal in structure. Pulmonic valve regurgitation is not visualized. No evidence of pulmonic stenosis. Aorta: The aortic root is normal in size and structure. Venous: The inferior vena cava is normal in size with greater than 50% respiratory variability, suggesting right atrial pressure of 3 mmHg. IAS/Shunts: No atrial level shunt detected by color flow Doppler. Agitated saline contrast was given intravenously to evaluate for intracardiac shunting. Agitated saline contrast bubble study was negative, with no evidence of any interatrial shunt.  LEFT VENTRICLE PLAX 2D LVIDd:         4.40 cm   Diastology LVIDs:         3.30 cm   LV e' medial:    6.42 cm/s LV PW:         0.90 cm   LV E/e' medial:  7.2 LV IVS:        0.90 cm   LV e' lateral:   8.92 cm/s LVOT diam:     2.00 cm   LV E/e' lateral: 5.2 LV SV:         49 LV SV Index:   28 LVOT Area:     3.14 cm  RIGHT VENTRICLE             IVC RV S prime:     11.30 cm/s  IVC diam: 1.70 cm TAPSE (M-mode): 1.8 cm LEFT ATRIUM           Index        RIGHT ATRIUM          Index LA diam:      3.00 cm 1.69 cm/m   RA Area:     8.27 cm LA Vol (A2C): 26.6 ml 14.96 ml/m  RA Volume:   15.60 ml 8.77 ml/m LA Vol (A4C): 19.0 ml 10.69 ml/m  AORTIC VALVE LVOT Vmax:   79.30  cm/s LVOT Vmean:  57.100 cm/s LVOT VTI:    0.157 m AI PHT:      1429 msec  AORTA Ao Root diam: 3.00 cm Ao Asc diam:  3.10 cm MITRAL VALVE MV Area (PHT): 3.36 cm    SHUNTS MV Decel Time: 226 msec    Systemic VTI:  0.16 m MV E velocity: 46.30 cm/s  Systemic Diam: 2.00 cm MV A velocity: 78.40 cm/s MV E/A ratio:  0.59 Olga Millers MD Electronically signed by Olga Millers MD Signature Date/Time: 01/19/2023/2:12:01 PM    Final    DG CHEST PORT 1 VIEW  Result Date: 01/18/2023 CLINICAL DATA:  CVA. EXAM: PORTABLE CHEST 1 VIEW COMPARISON:  06/21/2020. FINDINGS: The heart size and mediastinal contours are within normal limits. There is atherosclerotic calcification of the aorta. No consolidation, effusion, or pneumothorax. Shoulder arthroplasty changes are noted on the right. No acute osseous abnormality. IMPRESSION: No active disease. Electronically Signed   By: Charlestine Night.D.  On: 01/18/2023 23:46   MR BRAIN WO CONTRAST  Result Date: 01/18/2023 CLINICAL DATA:  Initial evaluation for neuro deficit, stroke suspected. EXAM: MRI HEAD WITHOUT CONTRAST TECHNIQUE: Multiplanar, multiecho pulse sequences of the brain and surrounding structures were obtained without intravenous contrast. COMPARISON:  Prior studies from earlier the same day. FINDINGS: Brain: Cerebral volume within normal limits for age. Scattered patchy T2/FLAIR hyperintensity involving the periventricular deep white matter both cerebral hemispheres, consistent with chronic small vessel ischemic disease, moderate in nature. No evidence for acute or subacute ischemia. Gray-white matter differentiation maintained. No areas of chronic cortical infarction. No acute or chronic intracranial blood products. No mass lesion, midline shift or mass effect. No hydrocephalus or extra-axial fluid collection. Pituitary gland and suprasellar region within normal limits. Vascular: Major intracranial vascular flow voids are maintained. Skull and upper cervical spine:  Craniocervical junction within normal limits. Bone marrow signal intensity normal. No scalp soft tissue abnormality. Sinuses/Orbits: Prior bilateral ocular lens replacement. Paranasal sinuses are clear. No mastoid effusion. Other: None. IMPRESSION: 1. No acute intracranial abnormality. 2. Moderate chronic microvascular ischemic disease. Electronically Signed   By: Rise Mu M.D.   On: 01/18/2023 23:26      Kassidie Hendriks T. Ezekiah Massie [facility]  If 7PM-7AM, please contact night-coverage [email or web address] 01/19/2023, 3:58 PM

## 2023-01-19 NOTE — Evaluation (Signed)
Physical Therapy Evaluation and Discharge Patient Details Name: Melissa Love MRN: [id number] DOB: 1935-07-16 Today's Date: 01/19/2023  History of Present Illness  87 y.o. female presented 01/18/23 with ataxia. +vertebral artery stenosis; hypertensive urgency vs CVA; MRI negative for acute changes  PMH includes anxiety, OA, migraines, HTN, vertebral artery occlusion, SVT,CHF, macular degeneration  Clinical Impression   Patient evaluated by Physical Therapy with no further acute PT needs identified. Pt scored 22/24 on Dynamic Gait Index indicating no significant risk of falling. Performed all mobility independently, including stairs.  PT is signing off. Thank you for this referral.         If plan is discharge home, recommend the following:     Can travel by private vehicle        Equipment Recommendations None recommended by PT  Recommendations for Other Services       Functional Status Assessment Patient has not had a recent decline in their functional status     Precautions / Restrictions Precautions Precautions: None      Mobility  Bed Mobility Overal bed mobility: Independent                  Transfers Overall transfer level: Independent                      Ambulation/Gait Ambulation/Gait assistance: Independent Gait Distance (Feet): 400 Feet Assistive device: None Gait Pattern/deviations: WFL(Within Functional Limits)   Gait velocity interpretation: >2.62 ft/sec, indicative of community ambulatory      Stairs Stairs: Yes Stairs assistance: Modified independent (Device/Increase time) Stair Management: One rail Right, No rails, Step to pattern, Forwards Number of Stairs: 2 (1, then 2) General stair comments: Normally holds onto her column to Rt of her 1 step, but able to step up without UE support; for multiple steps, prefers to use handrail  Wheelchair Mobility     Tilt Bed    Modified Rankin (Stroke Patients Only)        Balance Overall balance assessment: Independent                               Standardized Balance Assessment Standardized Balance Assessment : Dynamic Gait Index   Dynamic Gait Index Level Surface: Normal Change in Gait Speed: Normal Gait with Horizontal Head Turns: Normal Gait with Vertical Head Turns: Normal Gait and Pivot Turn: Normal Step Over Obstacle: Mild Impairment Step Around Obstacles: Normal Steps: Mild Impairment Total Score: 22       Pertinent Vitals/Pain Pain Assessment Pain Assessment: No/denies pain    Home Living Family/patient expects to be discharged to:: Private residence Living Arrangements: Alone Available Help at Discharge: Personal care attendant;Friend(s) (1 day/week; brother 20 minutes away; daughter in [place]) Type of Home: House Home Access: Stairs to enter Entrance Stairs-Rails: None Entrance Stairs-Number of Steps: 1   Home Layout: One level Home Equipment: Cane - single point;Adaptive equipment;Grab bars - tub/shower;Rolling Walker (2 wheels)      Prior Function Prior Level of Function : Independent/Modified Independent;Driving             Mobility Comments: if having bursitis pain in hip uses cane ADLs Comments: attendent brings groceries in 1x/wk and helps with housework     Extremity/Trunk Assessment   Upper Extremity Assessment Upper Extremity Assessment: Defer to OT evaluation    Lower Extremity Assessment Lower Extremity Assessment: Overall WFL for tasks assessed (coordination intact)  Cervical / Trunk Assessment Cervical / Trunk Assessment: Normal  Communication   Communication Communication: No apparent difficulties  Cognition Arousal: Alert Behavior During Therapy: WFL for tasks assessed/performed Overall Cognitive Status: Within Functional Limits for tasks assessed                                          General Comments      Exercises     Assessment/Plan    PT  Assessment Patient does not need any further PT services  PT Problem List         PT Treatment Interventions      PT Goals (Current goals can be found in the Care Plan section)  Acute Rehab PT Goals Patient Stated Goal: go home today PT Goal Formulation: All assessment and education complete, DC therapy    Frequency       Co-evaluation               AM-PAC PT "6 Clicks" Mobility  Outcome Measure Help needed turning from your back to your side while in a flat bed without using bedrails?: None Help needed moving from lying on your back to sitting on the side of a flat bed without using bedrails?: None Help needed moving to and from a bed to a chair (including a wheelchair)?: None Help needed standing up from a chair using your arms (e.g., wheelchair or bedside chair)?: None Help needed to walk in hospital room?: None Help needed climbing 3-5 steps with a railing? : None 6 Click Score: 24    End of Session Equipment Utilized During Treatment: Gait belt Activity Tolerance: Patient tolerated treatment well Patient left: in chair;with call bell/phone within reach (RN ok with no chair alarm as pt is independent) Nurse Communication: Mobility status;Other (comment) (?ok with no chair alarm) PT Visit Diagnosis: Difficulty in walking, not elsewhere classified (R26.2)    Time: 1610-9604 PT Time Calculation (min) (ACUTE ONLY): 24 min   Charges:   PT Evaluation $PT Eval Low Complexity: 1 Low   PT General Charges $$ ACUTE PT VISIT: 1 Visit          Jerolyn Center, PT Acute Rehabilitation Services  Office [phone]   Zena Amos 01/19/2023, 8:20 AM

## 2023-01-19 NOTE — Plan of Care (Signed)
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
## 2023-01-19 NOTE — Evaluation (Signed)
Occupational Therapy Evaluation and Discharge Patient Details Name: Melissa Love MRN: [id number] DOB: [birth year] Today's Date: 01/19/2023   History of Present Illness 87 y.o. female presented 01/18/23 with ataxia. +vertebral artery stenosis; hypertensive urgency vs CVA; MRI negative for acute changes  PMH includes anxiety, OA, migraines, HTN, vertebral artery occlusion, SVT,CHF, macular degeneration   Clinical Impression   At baseline, pt is Independent to Mod I with ADLs and functional mobility with occasional use of cane due to hip pain secondary to bursitis. At baseline, pt receives weekly assistance from paid attendant for grocery shopping and home management tasks. Pt now presents with B UE strength, coordination, and sensation WFL and no noted balance deficits. Pt demonstrates cognition WFL with mild short term memory and higher level problem solving deficits noted. Pt currently demonstrates ability to complete all ADLs Independent to Mod I with pt also stating she feels she is now at baseline functional level. No additional acute OT needs identified at this time. No post acute OT follow indicated at this time. OT signing off.      If plan is discharge home, recommend the following: Assistance with cooking/housework;Other (comment) (Occasional supervision to monitor pt safety with medication management and other higher level cognitive tasks.)    Functional Status Assessment  Patient has not had a recent decline in their functional status  Equipment Recommendations  None recommended by OT    Recommendations for Other Services       Precautions / Restrictions Precautions Precautions: None Restrictions Weight Bearing Restrictions: No      Mobility Bed Mobility Overal bed mobility: Independent                  Transfers Overall transfer level: Independent                        Balance Overall balance assessment: Independent                                          ADL either performed or assessed with clinical judgement   ADL Overall ADL's : Independent;Modified independent                                       General ADL Comments: Pt demonstrates ability to complete all ADLs Independent to Mod I.     Vision Baseline Vision/History: 6 Macular Degeneration Ability to See in Adequate Light: 2 Moderately impaired Patient Visual Report: No change from baseline       Perception         Praxis         Pertinent Vitals/Pain Pain Assessment Pain Assessment: No/denies pain     Extremity/Trunk Assessment Upper Extremity Assessment Upper Extremity Assessment: Overall WFL for tasks assessed;Right hand dominant (B UE fine and gross motor coordination WFL; B UE strength and ROM grossly symmetrical)   Lower Extremity Assessment Lower Extremity Assessment: Defer to PT evaluation   Cervical / Trunk Assessment Cervical / Trunk Assessment: Normal   Communication Communication Communication: No apparent difficulties   Cognition Arousal: Alert Behavior During Therapy: WFL for tasks assessed/performed Overall Cognitive Status: No family/caregiver present to determine baseline cognitive functioning  General Comments: AAOX4, pleasant throughout session, and with cognition largely [facility] with pt presenting with mild short term memory deficits and mild deficits with higher level problem solving.     General Comments  VSS on RA throughout session. Pt encouraged to follow up with PCP post acute discharge with any concerns related to ADLs, IADLs, or activity tolerance with pt verbalizing understanding and agreement.    Exercises     Shoulder Instructions      Home Living Family/patient expects to be discharged to:: Private residence Living Arrangements: Alone Available Help at Discharge: Family;Personal care attendant;Available PRN/intermittently;Friend(s)  (daughter lives in [place], granddaughter lives in [place]) Type of Home: House Home Access: Stairs to enter Secretary/administrator of Steps: 1 Entrance Stairs-Rails: None Home Layout: One level     Bathroom Shower/Tub: Chief Strategy Officer: Handicapped height Bathroom Accessibility: Yes How Accessible: Accessible via walker Home Equipment: Gilmer Mor - single point;Adaptive equipment;Grab bars - tub/shower;Rolling Walker (2 wheels) Adaptive Equipment: Reacher        Prior Functioning/Environment Prior Level of Function : Independent/Modified Independent;Driving             Mobility Comments: Typically Independent without an AD, uses cane if having hip pain due to bursitis ADLs Comments: independent to Mod I with ADLs, attendent brings groceries in 1x/wk and helps with housework        OT Problem List:        OT Treatment/Interventions:      OT Goals(Current goals can be found in the care plan section) Acute Rehab OT Goals Patient Stated Goal: To return home OT Goal Formulation: With patient  OT Frequency:      Co-evaluation              AM-PAC OT "6 Clicks" Daily Activity     Outcome Measure Help from another person eating meals?: None Help from another person taking care of personal grooming?: None Help from another person toileting, which includes using toliet, bedpan, or urinal?: None Help from another person bathing (including washing, rinsing, drying)?: None Help from another person to put on and taking off regular upper body clothing?: None Help from another person to put on and taking off regular lower body clothing?: None 6 Click Score: 24   End of Session Nurse Communication: Mobility status  Activity Tolerance: Patient tolerated treatment well Patient left: in chair;with call bell/phone within reach  OT Visit Diagnosis: Ataxia, unspecified (R27.0)                Time: 8101-7510 OT Time Calculation (min): 22 min Charges:   OT General Charges $OT Visit: 1 Visit OT Evaluation $OT Eval Low Complexity: 1 Low  Melissa Love "Melissa Love., OTR/L, MA Acute Rehab [phone]   Melissa Love 01/19/2023, 3:23 PM

## 2023-01-19 NOTE — Plan of Care (Signed)
  Problem: Education: Goal: Knowledge of General Education information will improve Description: Including pain rating scale, medication(s)/side effects and non-pharmacologic comfort measures Outcome: Adequate for Discharge   Problem: Health Behavior/Discharge Planning: Goal: Ability to manage health-related needs will improve Outcome: Adequate for Discharge   Problem: Clinical Measurements: Goal: Ability to maintain clinical measurements within normal limits will improve Outcome: Adequate for Discharge Goal: Will remain free from infection Outcome: Adequate for Discharge Goal: Diagnostic test results will improve Outcome: Adequate for Discharge Goal: Respiratory complications will improve Outcome: Adequate for Discharge Goal: Cardiovascular complication will be avoided Outcome: Adequate for Discharge   Problem: Activity: Goal: Risk for activity intolerance will decrease Outcome: Adequate for Discharge   Problem: Nutrition: Goal: Adequate nutrition will be maintained Outcome: Adequate for Discharge   Problem: Coping: Goal: Level of anxiety will decrease Outcome: Adequate for Discharge   Problem: Elimination: Goal: Will not experience complications related to bowel motility Outcome: Adequate for Discharge Goal: Will not experience complications related to urinary retention Outcome: Adequate for Discharge   Problem: Pain Managment: Goal: General experience of comfort will improve Outcome: Adequate for Discharge   Problem: Safety: Goal: Ability to remain free from injury will improve Outcome: Adequate for Discharge   Problem: Skin Integrity: Goal: Risk for impaired skin integrity will decrease Outcome: Adequate for Discharge   Problem: Education: Goal: Knowledge of disease or condition will improve Outcome: Adequate for Discharge Goal: Knowledge of secondary prevention will improve (MUST DOCUMENT ALL) Outcome: Adequate for Discharge Goal: Knowledge of patient  specific risk factors will improve (Mark N/A or DELETE if not current risk factor) Outcome: Adequate for Discharge   Problem: Ischemic Stroke/TIA Tissue Perfusion: Goal: Complications of ischemic stroke/TIA will be minimized Outcome: Adequate for Discharge   Problem: Coping: Goal: Will verbalize positive feelings about self Outcome: Adequate for Discharge Goal: Will identify appropriate support needs Outcome: Adequate for Discharge   Problem: Health Behavior/Discharge Planning: Goal: Ability to manage health-related needs will improve Outcome: Adequate for Discharge Goal: Goals will be collaboratively established with patient/family Outcome: Adequate for Discharge   Problem: Self-Care: Goal: Ability to participate in self-care as condition permits will improve Outcome: Adequate for Discharge Goal: Verbalization of feelings and concerns over difficulty with self-care will improve Outcome: Adequate for Discharge Goal: Ability to communicate needs accurately will improve Outcome: Adequate for Discharge   Problem: Nutrition: Goal: Risk of aspiration will decrease Outcome: Adequate for Discharge Goal: Dietary intake will improve Outcome: Adequate for Discharge   

## 2023-01-20 LAB — T3: T3, Total: 95 ng/dL (ref 71–180)

## 2023-01-20 NOTE — Telephone Encounter (Signed)
Voicemail not available

## 2023-01-20 NOTE — Telephone Encounter (Signed)
Patient notified and verbalized understanding. 

## 2023-01-20 NOTE — Telephone Encounter (Signed)
Call placed to patient - was admitted to [facility] 8/17-8/18/2024.  Her d/c summary did not mention cardiology f/u.  Would you like to see her sooner ?  Looks like she has recall in for October.

## 2023-01-29 DIAGNOSIS — I771 Stricture of artery: Secondary | ICD-10-CM | POA: Diagnosis not present

## 2023-01-29 DIAGNOSIS — Z79899 Other long term (current) drug therapy: Secondary | ICD-10-CM | POA: Diagnosis not present

## 2023-01-29 DIAGNOSIS — I6509 Occlusion and stenosis of unspecified vertebral artery: Secondary | ICD-10-CM | POA: Diagnosis not present

## 2023-01-29 DIAGNOSIS — R112 Nausea with vomiting, unspecified: Secondary | ICD-10-CM | POA: Diagnosis not present

## 2023-01-29 DIAGNOSIS — Z6829 Body mass index (BMI) 29.0-29.9, adult: Secondary | ICD-10-CM | POA: Diagnosis not present

## 2023-01-29 DIAGNOSIS — F411 Generalized anxiety disorder: Secondary | ICD-10-CM | POA: Diagnosis not present

## 2023-01-30 ENCOUNTER — Other Ambulatory Visit (HOSPITAL_COMMUNITY): Payer: Self-pay | Admitting: Interventional Radiology

## 2023-01-30 DIAGNOSIS — I771 Stricture of artery: Secondary | ICD-10-CM

## 2023-02-11 ENCOUNTER — Telehealth (HOSPITAL_COMMUNITY): Payer: Self-pay

## 2023-02-11 NOTE — Telephone Encounter (Signed)
Pt called wanting to know about doing an angio w/intent to treat w/Dr. Corliss Skains. Spoke w/our PA Molli Hazard) - She needs the diagnostic first in order to have a better idea and plan for what we're going to treat. We understand that it's difficult with transport, but it's important for D to know what needs treatment and how to treat it before attempting it all in the same procedure. Returned call to pt, no answer, no vm. AB

## 2023-02-12 ENCOUNTER — Other Ambulatory Visit (HOSPITAL_COMMUNITY): Payer: Self-pay | Admitting: Student

## 2023-02-12 DIAGNOSIS — G459 Transient cerebral ischemic attack, unspecified: Secondary | ICD-10-CM

## 2023-02-13 ENCOUNTER — Other Ambulatory Visit (HOSPITAL_COMMUNITY): Payer: Self-pay | Admitting: Interventional Radiology

## 2023-02-13 ENCOUNTER — Ambulatory Visit (HOSPITAL_COMMUNITY)
Admission: RE | Admit: 2023-02-13 | Discharge: 2023-02-13 | Disposition: A | Discharge status: Home / Self Care | Payer: Medicare Other | Source: Ambulatory Visit | Attending: Interventional Radiology | Admitting: Interventional Radiology

## 2023-02-13 ENCOUNTER — Encounter (HOSPITAL_COMMUNITY): Payer: Self-pay

## 2023-02-13 ENCOUNTER — Other Ambulatory Visit: Payer: Self-pay

## 2023-02-13 DIAGNOSIS — I471 Supraventricular tachycardia, unspecified: Secondary | ICD-10-CM | POA: Diagnosis not present

## 2023-02-13 DIAGNOSIS — I771 Stricture of artery: Secondary | ICD-10-CM

## 2023-02-13 DIAGNOSIS — I10 Essential (primary) hypertension: Secondary | ICD-10-CM | POA: Insufficient documentation

## 2023-02-13 DIAGNOSIS — Z7902 Long term (current) use of antithrombotics/antiplatelets: Secondary | ICD-10-CM | POA: Insufficient documentation

## 2023-02-13 DIAGNOSIS — E785 Hyperlipidemia, unspecified: Secondary | ICD-10-CM | POA: Insufficient documentation

## 2023-02-13 DIAGNOSIS — Z87891 Personal history of nicotine dependence: Secondary | ICD-10-CM | POA: Insufficient documentation

## 2023-02-13 DIAGNOSIS — G459 Transient cerebral ischemic attack, unspecified: Secondary | ICD-10-CM

## 2023-02-13 DIAGNOSIS — I70208 Unspecified atherosclerosis of native arteries of extremities, other extremity: Secondary | ICD-10-CM | POA: Diagnosis not present

## 2023-02-13 HISTORY — PX: IR ANGIO INTRA EXTRACRAN SEL COM CAROTID INNOMINATE BILAT MOD SED: IMG5360

## 2023-02-13 HISTORY — PX: IR ANGIO VERTEBRAL SEL VERTEBRAL UNI R MOD SED: IMG5368

## 2023-02-13 HISTORY — PX: IR US GUIDE VASC ACCESS RIGHT: IMG2390

## 2023-02-13 LAB — CBC
HCT: 43 % (ref 36.0–46.0)
Hemoglobin: 13.6 g/dL (ref 12.0–15.0)
MCH: 31 pg (ref 26.0–34.0)
MCHC: 31.6 g/dL (ref 30.0–36.0)
MCV: 97.9 fL (ref 80.0–100.0)
Platelets: 223 10*3/uL (ref 150–400)
RBC: 4.39 MIL/uL (ref 3.87–5.11)
RDW: 14.3 % (ref 11.5–15.5)
WBC: 9.2 10*3/uL (ref 4.0–10.5)
nRBC: 0 % (ref 0.0–0.2)

## 2023-02-13 LAB — BASIC METABOLIC PANEL
Anion gap: 9 (ref 5–15)
BUN: 21 mg/dL (ref 8–23)
CO2: 28 mmol/L (ref 22–32)
Calcium: 9.3 mg/dL (ref 8.9–10.3)
Chloride: 105 mmol/L (ref 98–111)
Creatinine, Ser: 0.77 mg/dL (ref 0.44–1.00)
GFR, Estimated: >60 mL/min (ref >60)
Glucose, Bld: 91 mg/dL (ref 70–99)
Potassium: 4.6 mmol/L (ref 3.5–5.1)
Sodium: 142 mmol/L (ref 135–145)

## 2023-02-13 LAB — PROTIME-INR
INR: 0.9 (ref 0.8–1.2)
Prothrombin Time: 12 s (ref 11.4–15.2)

## 2023-02-13 MED ORDER — VERAPAMIL HCL 2.5 MG/ML IV SOLN
INTRAVENOUS | Status: AC
  Filled 2023-02-13: qty 2

## 2023-02-13 MED ORDER — FENTANYL CITRATE (PF) 100 MCG/2ML IJ SOLN
INTRAMUSCULAR | Status: AC | PRN
  Administered 2023-02-13 (×2): 25 ug via INTRAVENOUS

## 2023-02-13 MED ORDER — NITROGLYCERIN 1 MG/10 ML FOR IR/CATH LAB
INTRA_ARTERIAL | Status: AC | PRN
  Administered 2023-02-13: 200 ug via INTRA_ARTERIAL

## 2023-02-13 MED ORDER — HEPARIN SODIUM (PORCINE) 1000 UNIT/ML IJ SOLN
INTRAMUSCULAR | Status: AC
  Filled 2023-02-13: qty 10

## 2023-02-13 MED ORDER — ACETAMINOPHEN 325 MG PO TABS
ORAL_TABLET | ORAL | Status: AC
  Filled 2023-02-13: qty 2

## 2023-02-13 MED ORDER — HEPARIN SODIUM (PORCINE) 1000 UNIT/ML IJ SOLN
INTRAMUSCULAR | Status: AC | PRN
  Administered 2023-02-13: 2000 [IU] via INTRAVENOUS

## 2023-02-13 MED ORDER — MIDAZOLAM HCL 2 MG/2ML IJ SOLN
INTRAMUSCULAR | Status: AC
  Filled 2023-02-13: qty 2

## 2023-02-13 MED ORDER — VERAPAMIL HCL 2.5 MG/ML IV SOLN
INTRAVENOUS | Status: AC | PRN
  Administered 2023-02-13: 2.5 mg via INTRA_ARTERIAL

## 2023-02-13 MED ORDER — IOHEXOL 300 MG/ML  SOLN
50.0000 mL | Freq: Once | INTRAMUSCULAR | Status: AC | PRN
  Administered 2023-02-13: 20 mL via INTRA_ARTERIAL

## 2023-02-13 MED ORDER — FENTANYL CITRATE (PF) 100 MCG/2ML IJ SOLN
INTRAMUSCULAR | Status: AC
  Filled 2023-02-13: qty 2

## 2023-02-13 MED ORDER — ACETAMINOPHEN 325 MG PO TABS
650.0000 mg | ORAL_TABLET | Freq: Four times a day (QID) | ORAL | Status: DC | PRN
  Administered 2023-02-13: 650 mg via ORAL

## 2023-02-13 MED ORDER — NITROGLYCERIN 1 MG/10 ML FOR IR/CATH LAB
INTRA_ARTERIAL | Status: AC
  Filled 2023-02-13: qty 10

## 2023-02-13 MED ORDER — LIDOCAINE HCL 1 % IJ SOLN
20.0000 mL | Freq: Once | INTRAMUSCULAR | Status: AC
  Administered 2023-02-13: 5 mL via INTRADERMAL

## 2023-02-13 MED ORDER — SODIUM CHLORIDE 0.9 % IV SOLN: INTRAVENOUS | Status: AC

## 2023-02-13 MED ORDER — MIDAZOLAM HCL 2 MG/2ML IJ SOLN
INTRAMUSCULAR | Status: AC | PRN
  Administered 2023-02-13 (×2): 1 mg via INTRAVENOUS

## 2023-02-13 MED ORDER — SODIUM CHLORIDE 0.9 % IV SOLN: INTRAVENOUS | Status: DC

## 2023-02-13 MED ORDER — LIDOCAINE HCL 1 % IJ SOLN
INTRAMUSCULAR | Status: AC
  Filled 2023-02-13: qty 20

## 2023-02-13 MED ORDER — IOHEXOL 300 MG/ML  SOLN
150.0000 mL | Freq: Once | INTRAMUSCULAR | Status: AC | PRN
  Administered 2023-02-13: 100 mL via INTRA_ARTERIAL

## 2023-02-13 NOTE — Progress Notes (Signed)
TR BAND REMOVAL  LOCATION:    right radial  DEFLATED PER PROTOCOL:    Yes.    TIME BAND OFF / DRESSING APPLIED:    1500 gauze dressing applied   SITE UPON ARRIVAL:    Level 0  SITE AFTER BAND REMOVAL:    Level 0  CIRCULATION SENSATION AND MOVEMENT:    Within Normal Limits   Yes.    COMMENTS:   no issues noted

## 2023-02-13 NOTE — Discharge Instructions (Signed)

## 2023-02-13 NOTE — Procedures (Signed)
INR.  Status post bilateral common carotid artery, right vertebral and left subclavian arteriograms.  Right radial approach..  Findings.  1.  Approximately 85% stenosis of the proximal left subclavian artery . 2. Slow antegrade hemodynamic  flow  in the nondominant left vertebral artery to the skull base.  Fatima Sanger MD
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
## 2023-02-17 ENCOUNTER — Other Ambulatory Visit (HOSPITAL_COMMUNITY): Payer: Self-pay | Admitting: Interventional Radiology

## 2023-02-17 ENCOUNTER — Telehealth: Payer: Self-pay | Admitting: Cardiology

## 2023-02-17 DIAGNOSIS — I771 Stricture of artery: Secondary | ICD-10-CM

## 2023-02-17 HISTORY — PX: IR ANGIO VERTEBRAL SEL SUBCLAVIAN INNOMINATE UNI L MOD SED: IMG5364

## 2023-02-17 NOTE — Telephone Encounter (Signed)
Pt c/o medication issue:  1. Name of Medication: losartan (COZAAR) 50 MG tablet  metoprolol succinate (TOPROL XL) 25 MG 24 hr tablet   2. How are you currently taking this medication (dosage and times per day)? Has not yet taken today  3. Are you having a reaction (difficulty breathing--STAT)? No   4. What is your medication issue? Patient stated her BP this morning is 104/56. She c/o lightheadedness and feeling weak. She is wanting to know if she should still take morning doses of these meds due to this.   Patient denies feeling she is going to faint.   Patient expressed concern of receiving a callback today due to the last time she called reporting symptoms she did not receive a callback until 3 days later after a hospital visit.   Please advise.

## 2023-02-18 ENCOUNTER — Other Ambulatory Visit: Payer: Self-pay

## 2023-02-18 NOTE — Telephone Encounter (Signed)
Called patient and she reported that her blood pressure yesterday was 104/56 and she was slightly light headed and feeling weak. Yesterday she drank a Gatorade and an Ensure and her blood pressure came up to 138/60 today. Last Thursday she saw Dr. Thayer Jew at [facility] and they performed an angiogram. She was found to have 2 blockages in her subclavian on the left side of her neck. On 10/2 she is going to have stents placed to open that vessel. She has removed almost all of the salt from her diet and she is currently on 4 blood pressure medications. Patient wanted Dr. Dulce Sellar to know in case he wanted to change anything.

## 2023-02-19 ENCOUNTER — Other Ambulatory Visit (HOSPITAL_COMMUNITY): Payer: Self-pay | Admitting: Interventional Radiology

## 2023-02-19 DIAGNOSIS — I771 Stricture of artery: Secondary | ICD-10-CM

## 2023-02-19 NOTE — Telephone Encounter (Signed)
Attempted to call patient to inform her of Dr. Hulen Shouts recommendation. Patient did not answer the phone and voice mail not set-up so unable to leave a message.

## 2023-02-20 ENCOUNTER — Other Ambulatory Visit: Payer: Self-pay

## 2023-02-20 MED ORDER — LOSARTAN POTASSIUM 50 MG PO TABS
50.0000 mg | ORAL_TABLET | Freq: Every day | ORAL | 3 refills | Status: DC
Start: 2023-02-20 — End: 2024-03-01

## 2023-02-20 NOTE — Telephone Encounter (Signed)
Called patient and informed her of Dr. Hulen Shouts recommendation below:  "Please have her to log blood pressure is good technique twice a day mid morning and evening 2 weeks and reduce losartan once daily"  Patient verbalized understanding and had no further questions at this time.

## 2023-02-25 ENCOUNTER — Other Ambulatory Visit (HOSPITAL_COMMUNITY): Payer: Self-pay | Admitting: Radiology

## 2023-02-25 DIAGNOSIS — I771 Stricture of artery: Secondary | ICD-10-CM

## 2023-02-27 LAB — PLATELET INHIBITION P2Y12

## 2023-03-04 ENCOUNTER — Other Ambulatory Visit: Payer: Self-pay | Admitting: Radiology

## 2023-03-04 ENCOUNTER — Encounter (HOSPITAL_COMMUNITY): Payer: Self-pay | Admitting: Interventional Radiology

## 2023-03-04 ENCOUNTER — Other Ambulatory Visit: Payer: Self-pay | Admitting: Student

## 2023-03-04 DIAGNOSIS — I771 Stricture of artery: Secondary | ICD-10-CM

## 2023-03-04 NOTE — Progress Notes (Signed)
SDW call  Patient was given pre-op instructions over the phone. Patient verbalized understanding of instructions provided.     PCP - Dr. Junious Dresser Cardiologist -  Pulmonary:    PPM/ICD - denies Device Orders - na Rep Notified - na   Chest x-ray - 01/18/2023 EKG -  01/21/2023 Stress Test -  ECHO - 01/19/2023 Cardiac Cath -   Sleep Study/sleep apnea/CPAP: denies  Non-diabetic  Blood Thinner Instructions:  Plavix, continue Aspirin Instructions:ASA,continue   ERAS Protcol - NPO   COVID TEST- n/a   Anesthesia review: Yes. HTN, SVT, having carotid surgery   Patient denies shortness of breath, fever, cough and chest pain over the phone call  Your procedure is scheduled on Wednesday March 05, 2023  Report to [facility] Main Entrance "A" at  0630  A.M., then check in with the Admitting office.  Call this number if you have problems the morning of surgery:  [phone]   If you have any questions prior to your surgery date call [phone]: Open Monday-Friday 8am-4pm If you experience any cold or flu symptoms such as cough, fever, chills, shortness of breath, etc. between now and your scheduled surgery, please notify us at the above number    Remember:  Do not eat or drink after midnight the night before your surgery  Take these medicines the morning of surgery with A SIP OF WATER:  Tylenol, norvasc, asa, plavix, allegra, gabapentin, levothyroxine  As needed: Ativan, flonase, zofran  As of today, STOP taking any Aleve, Naproxen, Ibuprofen, Motrin, Advil, Goody's, BC's, all herbal medications, fish oil, and all vitamins.

## 2023-03-04 NOTE — H&P (Deleted)
  The note originally documented on this encounter has been moved the the encounter in which it belongs.  

## 2023-03-04 NOTE — H&P (Signed)
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 angioplasty under MAC anesthesia, or general anesthesia. The procedure was reviewed in detail with the patient and also the patient's son. The patient has opted for endovascular revascularization of the proximal left subclavian artery. This will be scheduled as soon as possible. Patient advised to maintain adequate hydration. Patient advised to call for any concerns or questions. Electronically Signed   By: Julieanne Cotton M.D.   On: 02/17/2023 07:59    Labs:  CBC: Recent Labs    01/18/23 0926 01/19/23 0326 02/13/23 1013 03/05/23 0718  WBC 6.5 6.0 9.2 5.6  HGB 13.1 12.7 13.6 13.0  HCT 39.9 39.7 43.0 41.2  PLT 166 176 223 211    COAGS: Recent Labs    01/18/23 0926 02/13/23 1013 03/05/23 0718  INR 1.0 0.9 1.0  APTT 27  --   --     BMP: Recent Labs    01/18/23 0926 01/19/23 0326 02/13/23 1013 03/05/23  0718   NA 142 139 142 141  K 3.8 3.5 4.6 4.1  CL 105 103 105 108  CO2 28 26 28  20*  GLUCOSE 98 83 91 101*  BUN 11 10 21 13   CALCIUM 9.1 8.8* 9.3 9.0  CREATININE 0.58 0.64 0.77 0.67  GFRNONAA >60 >60 >60 >60    LIVER FUNCTION TESTS: Recent Labs    07/21/22 2050 01/18/23 0926 01/19/23 0326  BILITOT 0.3 0.7 0.7  AST 18 15 15   ALT 23 33 33  ALKPHOS 90 68 66  PROT 7.0 6.5 5.9*  ALBUMIN 4.7 4.0 3.5    TUMOR MARKERS: No results for input(s): "AFPTM", "CEA", "CA199", "CHROMGRNA" in the last 8760 hours.  Assessment and Plan:  Subclavian Stenosis  Will proceed with angiography and left subclavian artery angioplasty/stent today by Dr. Corliss Skains.  Risks and benefits of cerebral angiogram with intervention were discussed with the patient including, but not limited to bleeding, infection, vascular injury, contrast induced renal failure, stroke or even death.  This interventional procedure involves the use of X-rays and because of the nature of the planned procedure, it is possible that we will have prolonged use of X-ray fluoroscopy.  Potential radiation risks to you include (but are not limited to) the following: - A slightly elevated risk for cancer  several years later in life. This risk is typically less than 0.5% percent. This risk is low in comparison to the normal incidence of human cancer, which is 33% for women and 50% for men according to the [employer]. - Radiation induced injury can include skin redness, resembling a rash, tissue breakdown / ulcers and hair loss (which can be temporary or permanent).   The likelihood of either of these occurring depends on the difficulty of the procedure and whether you are sensitive to radiation due to previous procedures, disease, or genetic conditions.   IF your procedure requires a prolonged use of radiation, you will be notified and given written instructions for further action.  It is your responsibility to monitor the  irradiated area for the 2 weeks following the procedure and to notify your physician if you are concerned that you have suffered a radiation induced injury.    All of the patient's questions were answered, patient is agreeable to proceed.  Consent signed and in chart.   Thank you for allowing our service to participate in Melissa Love 's care.  Electronically Signed: Gwynneth Macleod, PA-C   03/05/2023, 7:57 AM      I spent a total of    25 Minutes in face to face in clinical consultation, greater than 50% of which was counseling/coordinating care for subclavian artery stent.

## 2023-03-05 ENCOUNTER — Encounter (HOSPITAL_COMMUNITY): Payer: Self-pay | Admitting: Interventional Radiology

## 2023-03-05 ENCOUNTER — Other Ambulatory Visit: Payer: Self-pay

## 2023-03-05 ENCOUNTER — Inpatient Hospital Stay (HOSPITAL_COMMUNITY): Payer: Self-pay | Admitting: Physician Assistant

## 2023-03-05 ENCOUNTER — Inpatient Hospital Stay (HOSPITAL_COMMUNITY)
Admission: RE | Admit: 2023-03-05 | Discharge: 2023-03-06 | DRG: 254 | Disposition: A | Discharge status: Home / Self Care | Payer: Medicare Other | Attending: Interventional Radiology | Admitting: Interventional Radiology

## 2023-03-05 ENCOUNTER — Inpatient Hospital Stay (HOSPITAL_COMMUNITY)
Admission: RE | Admit: 2023-03-05 | Discharge: 2023-03-05 | Disposition: A | Discharge status: Home / Self Care | Payer: Medicare Other | Source: Ambulatory Visit | Attending: Interventional Radiology | Admitting: Interventional Radiology

## 2023-03-05 ENCOUNTER — Encounter (HOSPITAL_COMMUNITY)
Admission: RE | Disposition: A | Discharge status: Home / Self Care | Payer: Self-pay | Source: Home / Self Care | Attending: Interventional Radiology

## 2023-03-05 DIAGNOSIS — I708 Atherosclerosis of other arteries: Secondary | ICD-10-CM | POA: Diagnosis present

## 2023-03-05 DIAGNOSIS — Z91013 Allergy to seafood: Secondary | ICD-10-CM | POA: Diagnosis not present

## 2023-03-05 DIAGNOSIS — Z96642 Presence of left artificial hip joint: Secondary | ICD-10-CM | POA: Diagnosis present

## 2023-03-05 DIAGNOSIS — R233 Spontaneous ecchymoses: Secondary | ICD-10-CM | POA: Diagnosis not present

## 2023-03-05 DIAGNOSIS — Z79890 Hormone replacement therapy (postmenopausal): Secondary | ICD-10-CM | POA: Diagnosis not present

## 2023-03-05 DIAGNOSIS — I771 Stricture of artery: Secondary | ICD-10-CM

## 2023-03-05 DIAGNOSIS — Z882 Allergy status to sulfonamides status: Secondary | ICD-10-CM | POA: Diagnosis not present

## 2023-03-05 DIAGNOSIS — Z8249 Family history of ischemic heart disease and other diseases of the circulatory system: Secondary | ICD-10-CM | POA: Diagnosis not present

## 2023-03-05 DIAGNOSIS — H3530 Unspecified macular degeneration: Secondary | ICD-10-CM | POA: Diagnosis present

## 2023-03-05 DIAGNOSIS — Z9841 Cataract extraction status, right eye: Secondary | ICD-10-CM

## 2023-03-05 DIAGNOSIS — Z885 Allergy status to narcotic agent status: Secondary | ICD-10-CM

## 2023-03-05 DIAGNOSIS — E039 Hypothyroidism, unspecified: Secondary | ICD-10-CM | POA: Diagnosis present

## 2023-03-05 DIAGNOSIS — I10 Essential (primary) hypertension: Secondary | ICD-10-CM | POA: Diagnosis present

## 2023-03-05 DIAGNOSIS — Z961 Presence of intraocular lens: Secondary | ICD-10-CM | POA: Diagnosis present

## 2023-03-05 DIAGNOSIS — I70208 Unspecified atherosclerosis of native arteries of extremities, other extremity: Secondary | ICD-10-CM | POA: Diagnosis not present

## 2023-03-05 DIAGNOSIS — F419 Anxiety disorder, unspecified: Secondary | ICD-10-CM | POA: Diagnosis present

## 2023-03-05 DIAGNOSIS — Z7982 Long term (current) use of aspirin: Secondary | ICD-10-CM | POA: Diagnosis not present

## 2023-03-05 DIAGNOSIS — Z884 Allergy status to anesthetic agent status: Secondary | ICD-10-CM

## 2023-03-05 DIAGNOSIS — Z79899 Other long term (current) drug therapy: Secondary | ICD-10-CM | POA: Diagnosis not present

## 2023-03-05 DIAGNOSIS — Z7902 Long term (current) use of antithrombotics/antiplatelets: Secondary | ICD-10-CM

## 2023-03-05 DIAGNOSIS — Z90710 Acquired absence of both cervix and uterus: Secondary | ICD-10-CM | POA: Diagnosis not present

## 2023-03-05 DIAGNOSIS — Z9842 Cataract extraction status, left eye: Secondary | ICD-10-CM | POA: Diagnosis not present

## 2023-03-05 DIAGNOSIS — Z87891 Personal history of nicotine dependence: Secondary | ICD-10-CM | POA: Diagnosis not present

## 2023-03-05 DIAGNOSIS — Z880 Allergy status to penicillin: Secondary | ICD-10-CM | POA: Diagnosis not present

## 2023-03-05 HISTORY — PX: RADIOLOGY WITH ANESTHESIA: SHX6223

## 2023-03-05 HISTORY — PX: IR TRANSCATH EXCRAN VERT OR CAR A STENT: IMG1955

## 2023-03-05 HISTORY — PX: IR ANGIO VERTEBRAL SEL SUBCLAVIAN INNOMINATE UNI L MOD SED: IMG5364

## 2023-03-05 LAB — CBC WITH DIFFERENTIAL/PLATELET
Abs Immature Granulocytes: 0.01 10*3/uL (ref 0.00–0.07)
Basophils Absolute: 0.1 10*3/uL (ref 0.0–0.1)
Basophils Relative: 1 %
Eosinophils Absolute: 0.1 10*3/uL (ref 0.0–0.5)
Eosinophils Relative: 2 %
HCT: 41.2 % (ref 36.0–46.0)
Hemoglobin: 13 g/dL (ref 12.0–15.0)
Immature Granulocytes: 0 %
Lymphocytes Relative: 34 %
Lymphs Abs: 1.9 10*3/uL (ref 0.7–4.0)
MCH: 30.7 pg (ref 26.0–34.0)
MCHC: 31.6 g/dL (ref 30.0–36.0)
MCV: 97.4 fL (ref 80.0–100.0)
Monocytes Absolute: 0.4 10*3/uL (ref 0.1–1.0)
Monocytes Relative: 8 %
Neutro Abs: 3.1 10*3/uL (ref 1.7–7.7)
Neutrophils Relative %: 55 %
Platelets: 211 10*3/uL (ref 150–400)
RBC: 4.23 MIL/uL (ref 3.87–5.11)
RDW: 14.2 % (ref 11.5–15.5)
WBC: 5.6 10*3/uL (ref 4.0–10.5)
nRBC: 0 % (ref 0.0–0.2)

## 2023-03-05 LAB — HEPARIN LEVEL (UNFRACTIONATED): Heparin Unfractionated: 0.18 [IU]/mL — ABNORMAL LOW (ref 0.30–0.70)

## 2023-03-05 LAB — BASIC METABOLIC PANEL
Anion gap: 13 (ref 5–15)
BUN: 13 mg/dL (ref 8–23)
CO2: 20 mmol/L — ABNORMAL LOW (ref 22–32)
Calcium: 9 mg/dL (ref 8.9–10.3)
Chloride: 108 mmol/L (ref 98–111)
Creatinine, Ser: 0.67 mg/dL (ref 0.44–1.00)
GFR, Estimated: >60 mL/min (ref >60)
Glucose, Bld: 101 mg/dL — ABNORMAL HIGH (ref 70–99)
Potassium: 4.1 mmol/L (ref 3.5–5.1)
Sodium: 141 mmol/L (ref 135–145)

## 2023-03-05 LAB — PROTIME-INR
INR: 1 (ref 0.8–1.2)
Prothrombin Time: 13.2 s (ref 11.4–15.2)

## 2023-03-05 SURGERY — RADIOLOGY WITH ANESTHESIA
Anesthesia: General

## 2023-03-05 MED ORDER — GLYCOPYRROLATE 0.2 MG/ML IJ SOLN
INTRAMUSCULAR | Status: DC | PRN
Start: 2023-03-05 — End: 2023-03-05
  Administered 2023-03-05: .2 mg via INTRAVENOUS

## 2023-03-05 MED ORDER — ACETAMINOPHEN 325 MG PO TABS
650.0000 mg | ORAL_TABLET | ORAL | Status: DC | PRN
  Administered 2023-03-05: 650 mg via ORAL
  Filled 2023-03-05: qty 2

## 2023-03-05 MED ORDER — FENTANYL CITRATE (PF) 250 MCG/5ML IJ SOLN
INTRAMUSCULAR | Status: DC | PRN
  Administered 2023-03-05 (×2): 50 ug via INTRAVENOUS

## 2023-03-05 MED ORDER — CLOPIDOGREL BISULFATE 75 MG PO TABS: 75.0000 mg | ORAL_TABLET | Freq: Every day | ORAL | Status: DC

## 2023-03-05 MED ORDER — EPTIFIBATIDE 20 MG/10ML IV SOLN
INTRAVENOUS | Status: AC | PRN
  Administered 2023-03-05 (×3): 1.5 mg via INTRA_ARTERIAL

## 2023-03-05 MED ORDER — CLOPIDOGREL BISULFATE 75 MG PO TABS
ORAL_TABLET | ORAL | Status: AC
  Filled 2023-03-05: qty 1

## 2023-03-05 MED ORDER — ASPIRIN 325 MG PO TBEC: 325.0000 mg | DELAYED_RELEASE_TABLET | ORAL | Status: DC

## 2023-03-05 MED ORDER — EPTIFIBATIDE 20 MG/10ML IV SOLN
INTRAVENOUS | Status: AC
  Filled 2023-03-05: qty 10

## 2023-03-05 MED ORDER — CLOPIDOGREL BISULFATE 75 MG PO TABS
75.0000 mg | ORAL_TABLET | Freq: Every day | ORAL | Status: DC
  Administered 2023-03-06: 75 mg via ORAL
  Filled 2023-03-05: qty 1

## 2023-03-05 MED ORDER — SERTRALINE HCL 50 MG PO TABS
50.0000 mg | ORAL_TABLET | Freq: Every day | ORAL | Status: DC
  Administered 2023-03-05: 50 mg via ORAL
  Filled 2023-03-05: qty 1

## 2023-03-05 MED ORDER — NIMODIPINE 30 MG PO CAPS
0.0000 mg | ORAL_CAPSULE | ORAL | Status: AC
  Administered 2023-03-05: 60 mg via ORAL
  Filled 2023-03-05: qty 2

## 2023-03-05 MED ORDER — ASPIRIN 81 MG PO CHEW: 81.0000 mg | CHEWABLE_TABLET | Freq: Every day | ORAL | Status: DC

## 2023-03-05 MED ORDER — ACETAMINOPHEN 325 MG PO TABS
650.0000 mg | ORAL_TABLET | Freq: Two times a day (BID) | ORAL | Status: DC
  Administered 2023-03-05 – 2023-03-06 (×2): 650 mg via ORAL
  Filled 2023-03-05 (×2): qty 2

## 2023-03-05 MED ORDER — LIDOCAINE 5 % EX PTCH: 1.0000 | MEDICATED_PATCH | Freq: Every day | CUTANEOUS | Status: DC | PRN

## 2023-03-05 MED ORDER — GABAPENTIN 100 MG PO CAPS
100.0000 mg | ORAL_CAPSULE | Freq: Two times a day (BID) | ORAL | Status: DC
  Administered 2023-03-05 – 2023-03-06 (×2): 100 mg via ORAL
  Filled 2023-03-05 (×2): qty 1

## 2023-03-05 MED ORDER — DEXAMETHASONE SODIUM PHOSPHATE 10 MG/ML IJ SOLN
INTRAMUSCULAR | Status: DC | PRN
  Administered 2023-03-05: 10 mg via INTRAVENOUS

## 2023-03-05 MED ORDER — FENTANYL CITRATE (PF) 100 MCG/2ML IJ SOLN
INTRAMUSCULAR | Status: AC
  Filled 2023-03-05: qty 2

## 2023-03-05 MED ORDER — CLEVIDIPINE BUTYRATE 0.5 MG/ML IV EMUL
0.0000 mg/h | INTRAVENOUS | Status: AC
  Administered 2023-03-05: 1 mg/h via INTRAVENOUS
  Filled 2023-03-05: qty 50

## 2023-03-05 MED ORDER — SODIUM CHLORIDE 0.9 % IV SOLN: INTRAVENOUS | Status: DC

## 2023-03-05 MED ORDER — ROCURONIUM BROMIDE 10 MG/ML (PF) SYRINGE
PREFILLED_SYRINGE | INTRAVENOUS | Status: DC | PRN
  Administered 2023-03-05: 50 mg via INTRAVENOUS

## 2023-03-05 MED ORDER — IOHEXOL 300 MG/ML  SOLN
150.0000 mL | Freq: Once | INTRAMUSCULAR | Status: AC | PRN
  Administered 2023-03-05: 90 mL via INTRA_ARTERIAL

## 2023-03-05 MED ORDER — NIMODIPINE 30 MG PO CAPS: 0.0000 mg | ORAL_CAPSULE | ORAL | Status: DC

## 2023-03-05 MED ORDER — CHLORHEXIDINE GLUCONATE 0.12 % MT SOLN
15.0000 mL | Freq: Once | OROMUCOSAL | Status: AC
  Administered 2023-03-05: 15 mL via OROMUCOSAL
  Filled 2023-03-05: qty 15

## 2023-03-05 MED ORDER — LEVOTHYROXINE SODIUM 25 MCG PO TABS
25.0000 ug | ORAL_TABLET | Freq: Every day | ORAL | Status: DC
  Administered 2023-03-06: 25 ug via ORAL
  Filled 2023-03-05: qty 1

## 2023-03-05 MED ORDER — HEPARIN (PORCINE) 25000 UT/250ML-% IV SOLN
INTRAVENOUS | Status: AC
  Filled 2023-03-05: qty 250

## 2023-03-05 MED ORDER — PHENOL 1.4 % MT LIQD
1.0000 | OROMUCOSAL | Status: DC | PRN
  Administered 2023-03-05: 1 via OROMUCOSAL
  Filled 2023-03-05: qty 177

## 2023-03-05 MED ORDER — ACETAMINOPHEN 650 MG RE SUPP: 650.0000 mg | RECTAL | Status: DC | PRN

## 2023-03-05 MED ORDER — HEPARIN SODIUM (PORCINE) 1000 UNIT/ML IJ SOLN
INTRAMUSCULAR | Status: DC | PRN
Start: 2023-03-05 — End: 2023-03-05
  Administered 2023-03-05: 1000 [IU] via INTRAVENOUS
  Administered 2023-03-05: 3000 [IU] via INTRAVENOUS

## 2023-03-05 MED ORDER — AMLODIPINE BESYLATE 5 MG PO TABS
5.0000 mg | ORAL_TABLET | Freq: Every day | ORAL | Status: DC
  Administered 2023-03-06: 5 mg via ORAL
  Filled 2023-03-05: qty 1

## 2023-03-05 MED ORDER — EPHEDRINE SULFATE-NACL 50-0.9 MG/10ML-% IV SOSY
PREFILLED_SYRINGE | INTRAVENOUS | Status: DC | PRN
  Administered 2023-03-05: 2.5 mg via INTRAVENOUS
  Administered 2023-03-05: 5 mg via INTRAVENOUS
  Administered 2023-03-05: 2.5 mg via INTRAVENOUS
  Administered 2023-03-05: 5 mg via INTRAVENOUS

## 2023-03-05 MED ORDER — ASPIRIN 81 MG PO CHEW
CHEWABLE_TABLET | ORAL | Status: AC
  Administered 2023-03-05: 243 mg
  Filled 2023-03-05: qty 3

## 2023-03-05 MED ORDER — LORAZEPAM 1 MG PO TABS
0.5000 mg | ORAL_TABLET | Freq: Every day | ORAL | Status: DC
  Administered 2023-03-05: 0.5 mg via ORAL
  Filled 2023-03-05: qty 1

## 2023-03-05 MED ORDER — SUGAMMADEX SODIUM 200 MG/2ML IV SOLN
INTRAVENOUS | Status: DC | PRN
  Administered 2023-03-05: 200 mg via INTRAVENOUS

## 2023-03-05 MED ORDER — LOSARTAN POTASSIUM 50 MG PO TABS
50.0000 mg | ORAL_TABLET | Freq: Two times a day (BID) | ORAL | Status: DC
  Administered 2023-03-06: 50 mg via ORAL
  Filled 2023-03-05: qty 1

## 2023-03-05 MED ORDER — CLOPIDOGREL BISULFATE 75 MG PO TABS
75.0000 mg | ORAL_TABLET | ORAL | Status: AC
  Administered 2023-03-05: 75 mg via ORAL
  Filled 2023-03-05: qty 1

## 2023-03-05 MED ORDER — ONDANSETRON HCL 4 MG/2ML IJ SOLN
INTRAMUSCULAR | Status: DC | PRN
  Administered 2023-03-05: 4 mg via INTRAVENOUS

## 2023-03-05 MED ORDER — LIDOCAINE HCL 1 % IJ SOLN
INTRAMUSCULAR | Status: AC
  Filled 2023-03-05: qty 20

## 2023-03-05 MED ORDER — NITROGLYCERIN 1 MG/10 ML FOR IR/CATH LAB
INTRA_ARTERIAL | Status: AC
  Filled 2023-03-05: qty 10

## 2023-03-05 MED ORDER — CEPHALEXIN 250 MG PO CAPS
250.0000 mg | ORAL_CAPSULE | Freq: Every day | ORAL | Status: DC
  Administered 2023-03-05: 250 mg via ORAL
  Filled 2023-03-05 (×2): qty 1

## 2023-03-05 MED ORDER — VANCOMYCIN HCL 1000 MG IV SOLR
1000.0000 mg | INTRAVENOUS | Status: AC
  Administered 2023-03-05: 1000 mg via INTRAVENOUS
  Filled 2023-03-05: qty 20

## 2023-03-05 MED ORDER — CLOPIDOGREL BISULFATE 75 MG PO TABS
75.0000 mg | ORAL_TABLET | Freq: Once | ORAL | Status: AC
  Administered 2023-03-05: 75 mg via ORAL

## 2023-03-05 MED ORDER — ACETAMINOPHEN 160 MG/5ML PO SOLN: 650.0000 mg | ORAL | Status: DC | PRN

## 2023-03-05 MED ORDER — ORAL CARE MOUTH RINSE: 15.0000 mL | OROMUCOSAL | Status: DC | PRN

## 2023-03-05 MED ORDER — HEPARIN (PORCINE) 25000 UT/250ML-% IV SOLN
500.0000 [IU]/h | INTRAVENOUS | Status: AC
  Administered 2023-03-05: 500 [IU]/h via INTRAVENOUS
  Filled 2023-03-05: qty 250

## 2023-03-05 MED ORDER — LIDOCAINE 2% (20 MG/ML) 5 ML SYRINGE
INTRAMUSCULAR | Status: DC | PRN
  Administered 2023-03-05: 100 mg via INTRAVENOUS

## 2023-03-05 MED ORDER — PROPOFOL 10 MG/ML IV BOLUS
INTRAVENOUS | Status: DC | PRN
  Administered 2023-03-05: 30 mg via INTRAVENOUS
  Administered 2023-03-05: 100 mg via INTRAVENOUS

## 2023-03-05 MED ORDER — CHLORHEXIDINE GLUCONATE CLOTH 2 % EX PADS: 6.0000 | MEDICATED_PAD | Freq: Every day | CUTANEOUS | Status: DC

## 2023-03-05 MED ORDER — CLEVIDIPINE BUTYRATE 0.5 MG/ML IV EMUL
INTRAVENOUS | Status: AC
  Filled 2023-03-05: qty 50

## 2023-03-05 MED ORDER — ASPIRIN 81 MG PO CHEW
81.0000 mg | CHEWABLE_TABLET | Freq: Every day | ORAL | Status: DC
  Administered 2023-03-06: 81 mg via ORAL
  Filled 2023-03-05: qty 1

## 2023-03-05 MED ORDER — CLEVIDIPINE BUTYRATE 0.5 MG/ML IV EMUL
INTRAVENOUS | Status: DC | PRN
Start: 2023-03-05 — End: 2023-03-05
  Administered 2023-03-05: .5 mg/h via INTRAVENOUS

## 2023-03-05 MED ORDER — ORAL CARE MOUTH RINSE: 15.0000 mL | Freq: Once | OROMUCOSAL | Status: AC

## 2023-03-05 NOTE — Progress Notes (Signed)
PHARMACY - ANTICOAGULATION CONSULT NOTE  Pharmacy Consult:  Heparin Indication: Post Interventional Neuroradiology Procedure   Allergies  Allergen Reactions   Hiprex [Methenamine] Rash and Other (See Comments)    Fever, Chills   Morphine Swelling    Low bp    Penicillins Rash   Sulfa Antibiotics Rash   Sulfamethoxazole-Trimethoprim Nausea Only and Rash    Patient Measurements: Height: 5\' 2"  (157.5 cm) Weight: 72.6 kg (160 lb) IBW/kg (Calculated) : 50.1 Heparin Dosing Weight: 65 kg  Vital Signs: Temp: 98 F (36.7 C) (10/02 2000) Temp Source: Oral (10/02 2000) BP: 117/54 (10/02 2200) Pulse Rate: 87 (10/02 2200)  Labs: Recent Labs    03/05/23 0718 03/05/23 2138  HGB 13.0  --   HCT 41.2  --   PLT 211  --   LABPROT 13.2  --   INR 1.0  --   HEPARINUNFRC  --  0.18*  CREATININE 0.67  --     Estimated Creatinine Clearance: 48 mL/min (by C-G formula based on SCr of 0.67 mg/dL).   Medical History: Past Medical History:  Diagnosis Date   Anxiety    Arthritis    Headache    migraines but none since 15 years   Hypertension    Hypothyroidism    Macular degeneration of left eye    injections every month     Assessment: 86 YOF with severe stenosis of the L subclavian artery s/p revascularization and stenting on 10/2.  Heparin started in PACU.  HL 0.18 -therapeutic on current rate of 500 u/hr   Goal of Therapy:  Heparin level 0.1-0.25 units/ml Monitor platelets by anticoagulation protocol: Yes   Plan:  Continue heparin gtt at 500 units/hr Stop heparin in AM per protocol   Calton Dach, PharmD, BCCCP Clinical Pharmacist 03/05/2023 10:31 PM

## 2023-03-05 NOTE — Sedation Documentation (Signed)
ACT 195, Dr. Corliss Skains is aware

## 2023-03-05 NOTE — Sedation Documentation (Signed)
Handoff given to [facility], PACU RN. Groin site assessed, site C/D/I. Soft to palpation, no hematoma noted, and right legdistal pulses and left upper extremity pulses were palpable/intact.

## 2023-03-05 NOTE — Anesthesia Procedure Notes (Signed)
Arterial Line Insertion Start/End10/07/2022 7:50 AM Performed by: Eilene Ghazi, MD, Noah Delaine, CRNA, CRNA  Patient location: Pre-op. Preanesthetic checklist: patient identified, IV checked, site marked, risks and benefits discussed, surgical consent, monitors and equipment checked, pre-op evaluation, timeout performed and anesthesia consent Lidocaine 1% used for infiltration radial was placed Catheter size: 20 G Hand hygiene performed  and maximum sterile barriers used   Attempts: 1 Procedure performed without using ultrasound guided technique. Following insertion, dressing applied and Biopatch. Post procedure assessment: normal and unchanged

## 2023-03-05 NOTE — Anesthesia Postprocedure Evaluation (Signed)
Anesthesia Post Note  Patient: Melissa Love  Procedure(s) Performed: cerebral angioplasty and possible stenosis of left subclavian artery     Patient location during evaluation: PACU Anesthesia Type: General Level of consciousness: awake and alert Pain management: pain level controlled Vital Signs Assessment: post-procedure vital signs reviewed and stable Respiratory status: spontaneous breathing, nonlabored ventilation, respiratory function stable and patient connected to nasal cannula oxygen Cardiovascular status: blood pressure returned to baseline and stable Postop Assessment: no apparent nausea or vomiting Anesthetic complications: no  No notable events documented.  Last Vitals:  Vitals:   03/05/23 1215 03/05/23 1230  BP: (!) 124/53 (!) 117/55  Pulse: 61 60  Resp: 10 10  Temp:  36.4 C  SpO2: 97% 99%    Last Pain:  Vitals:   03/05/23 1230  TempSrc:   PainSc: 0-No pain                 [name]

## 2023-03-05 NOTE — Progress Notes (Signed)
PHARMACY - ANTICOAGULATION CONSULT NOTE  Pharmacy Consult:  Heparin Indication: Post Interventional Neuroradiology Procedure   Allergies  Allergen Reactions   Hiprex [Methenamine] Rash and Other (See Comments)    Fever, Chills   Morphine Swelling    Low bp    Penicillins Rash   Sulfa Antibiotics Rash   Sulfamethoxazole-Trimethoprim Nausea Only and Rash    Patient Measurements: Height: 5\' 2"  (157.5 cm) Weight: 72.6 kg (160 lb) IBW/kg (Calculated) : 50.1 Heparin Dosing Weight: 65 kg  Vital Signs: Temp: 97.6 F (36.4 C) (10/02 1230) Temp Source: Oral (10/02 0701) BP: 166/46 (10/02 1300) Pulse Rate: 60 (10/02 1230)  Labs: Recent Labs    03/05/23 0718  HGB 13.0  HCT 41.2  PLT 211  LABPROT 13.2  INR 1.0  CREATININE 0.67    Estimated Creatinine Clearance: 48 mL/min (by C-G formula based on SCr of 0.67 mg/dL).   Medical History: Past Medical History:  Diagnosis Date   Anxiety    Arthritis    Headache    migraines but none since 15 years   Hypertension    Hypothyroidism    Macular degeneration of left eye    injections every month     Assessment: 65 YOF with severe stenosis of the L subclavian artery s/p revascularization and stenting on 10/2.  Heparin started in PACU.  CBC stable; no bleeding reported.  Goal of Therapy:  Heparin level 0.1-0.25 units/ml Monitor platelets by anticoagulation protocol: Yes   Plan:  Continue heparin gtt at 500 units/hr Check 8 hr heparin level Stop heparin in AM per protocol   Corynne Scibilia D. Laney Potash, PharmD, BCPS, BCCCP 03/05/2023, 2:11 PM

## 2023-03-05 NOTE — Procedures (Signed)
INR.  Status post left subclavian arteriogram.  Right CFA approach.  Findings .   High-grade symptomatic stenosis of the proximal left subclavian artery.  Status post endovascular revascularization of severely stenotic proximal left subclavian artery with stent assisted angioplasty with placement of a 7 mm x 24 mm Palmaz  balloon expandable stent.    8 French Angio-Seal device deployed for hemostasis at the right groin puncture site.  Distal pulses all present bilaterally unchanged.  Distal left radial pulse also present.  Patient extubated  Moving all fours equally.  Appropriately responsive.  Denies any headaches nausea vomiting.   Vision intact.  No facial asymmetry.  Tongue midline.  Fatima Sanger MD.

## 2023-03-05 NOTE — Progress Notes (Signed)
Referring Physician(s): Dr Azell Der  Supervising Physician: Julieanne Cotton  Patient Status:  Melissa Love - In-pt  Chief Complaint:  Left subclavian artery stenosis  Subjective:  10/2 NIR procedure:  High-grade symptomatic stenosis of the proximal left subclavian artery. Status post endovascular revascularization of severely stenotic proximal left subclavian artery with stent assisted angioplasty with placement of a 7 mm x 24 mm Palmaz  balloon expandable stent.    Pt doing well Moving all 4s; speech clear Coherent  Minimal pain at groin Denies headache; denies N/V Denies speech or vision issues Denies numbness;tingling  Allergies: Hiprex [methenamine], Morphine, Penicillins, Sulfa antibiotics, and Sulfamethoxazole-trimethoprim  Medications: Prior to Admission medications   Medication Sig Start Date End Date Taking? Authorizing Provider  acetaminophen (TYLENOL) 650 MG CR tablet Take 500-650 mg by mouth 2 (two) times daily.   Yes [provider]  amLODipine (NORVASC) 5 MG tablet Take 1 tablet (5 mg total) by mouth daily. 12/08/22  Yes Rondel Baton, MD  aspirin EC 81 MG tablet Take 1 tablet (81 mg total) by mouth daily. Swallow whole. 07/23/22  Yes Ghimire, Werner Lean, MD  clopidogrel (PLAVIX) 75 MG tablet Take 1 tablet (75 mg total) by mouth daily. 01/20/23 04/20/23 Yes Almon Hercules, MD  Coenzyme Q10 (COQ10) 100 MG CAPS Take 100 mg by mouth daily.   Yes [provider]  fexofenadine (ALLEGRA) 180 MG tablet Take 180 mg by mouth daily.   Yes [provider]  fluticasone (FLONASE) 50 MCG/ACT nasal spray Place 2 sprays into both nostrils 2 (two) times daily as needed for allergies or rhinitis.   Yes [provider]  gabapentin (NEURONTIN) 100 MG capsule Take 100 mg by mouth 2 (two) times daily.   Yes [provider]  Lactobacillus-Inulin (CULTURELLE DIGESTIVE DAILY) CAPS Take 1 capsule by mouth daily.   Yes [provider]   levothyroxine (SYNTHROID) 25 MCG tablet Take 25 mcg by mouth daily before breakfast.   Yes [provider]  lidocaine (LIDODERM) 5 % Place 1 patch onto the skin daily as needed (Pain). Remove & Discard patch within 12 hours or as directed by MD   Yes [provider]  LORazepam (ATIVAN) 0.5 MG tablet Take 0.5 mg by mouth at bedtime. Additional if needed during the day   Yes [provider]  losartan (COZAAR) 50 MG tablet Take 1 tablet (50 mg total) by mouth daily. Patient taking differently: Take 50 mg by mouth 2 (two) times daily. 02/20/23 05/21/23 Yes Baldo Daub, MD  Magnesium 200 MG TABS Take 200 mg by mouth daily as needed (Muscle cramps).   Yes [provider]  metoprolol succinate (TOPROL XL) 25 MG 24 hr tablet Take 1 tablet (25 mg total) by mouth daily. 08/02/22  Yes Baldo Daub, MD  Multiple Vitamins-Minerals (CENTRUM SILVER PO) Take 1 tablet by mouth 2 (two) times a week.   Yes [provider]  Multiple Vitamins-Minerals (PRESERVISION AREDS 2) CAPS Take 1 capsule by mouth daily.   Yes [provider]  ondansetron (ZOFRAN) 4 MG tablet Take 4 mg by mouth every 8 (eight) hours as needed for nausea.   Yes [provider]  Polyethyl Glycol-Propyl Glycol (SYSTANE FREE OP) Place 1 drop into both eyes as needed (Dry eye).   Yes [provider]  sertraline (ZOLOFT) 50 MG tablet Take 50 mg by mouth at bedtime.   Yes [provider]  simvastatin (ZOCOR) 40 MG tablet Take 1  tablet (40 mg total) by mouth daily at 6 PM. 01/19/23  Yes Gonfa, Boyce Medici, MD  calcium carbonate (TUMS - DOSED IN MG ELEMENTAL CALCIUM) 500 MG chewable tablet Chew 1 tablet by mouth 2 (two) times a week.    [provider]  cephALEXin (KEFLEX) 250 MG capsule Take 250 mg by mouth at bedtime.    [provider]  cloNIDine (CATAPRES) 0.1 MG tablet Take 1 tablet (0.1 mg total) by mouth daily as needed (Systolic 180 or above and or  Diastolic 100 or above). Patient not taking: Reported on 02/26/2023 12/11/22   Flossie Dibble, NP  Ranibizumab (LUCENTIS) 0.5 MG/0.05ML SOSY by Intravitreal route See admin instructions. Every two to three months    [provider]     Vital Signs: BP (!) 166/46 (BP Location: Left Arm)   Pulse 70   Temp 97.6 F (36.4 C)   Resp 12   Ht 5\' 2"  (1.575 m)   Wt 160 lb (72.6 kg)   SpO2 95%   BMI 29.26 kg/m   Physical Exam Vitals reviewed.  Constitutional:      Appearance: Normal appearance.     Comments: A/O Face symmetrical Tongue midline  HENT:     Mouth/Throat:     Mouth: Mucous membranes are moist.  Eyes:     Extraocular Movements: Extraocular movements intact.  Musculoskeletal:        General: No swelling or tenderness. Normal range of motion.     Right lower leg: No edema.     Left lower leg: No edema.     Comments: Moving all 4s Good strength and sensation Follows all commands Rt groin NT no bleeding; no hematoma Rt foot good pulses  Skin:    General: Skin is warm.  Neurological:     Mental Status: She is alert and oriented to person, place, and time.  Psychiatric:        Behavior: Behavior normal.     Imaging: No results found.  Labs:  CBC: Recent Labs    01/18/23 0926 01/19/23 0326 02/13/23 1013 03/05/23 0718  WBC 6.5 6.0 9.2 5.6  HGB 13.1 12.7 13.6 13.0  HCT 39.9 39.7 43.0 41.2  PLT 166 176 223 211    COAGS: Recent Labs    01/18/23 0926 02/13/23 1013 03/05/23 0718  INR 1.0 0.9 1.0  APTT 27  --   --     BMP: Recent Labs    01/18/23 0926 01/19/23 0326 02/13/23 1013 03/05/23 0718  NA 142 139 142 141  K 3.8 3.5 4.6 4.1  CL 105 103 105 108  CO2 28 26 28  20*  GLUCOSE 98 83 91 101*  BUN 11 10 21 13   CALCIUM 9.1 8.8* 9.3 9.0  CREATININE 0.58 0.64 0.77 0.67  GFRNONAA >60 >60 >60 >60    LIVER FUNCTION TESTS: Recent Labs    07/21/22 2050 01/18/23 0926 01/19/23 0326  BILITOT 0.3 0.7 0.7  AST 18 15 15   ALT 23 33 33   ALKPHOS 90 68 66  PROT 7.0 6.5 5.9*  ALBUMIN 4.7 4.0 3.5    Assessment and Plan:  L Subclavian artery angioplasty/stent placement in NIR with Dr Corliss Skains Doing well Plans for DC and resume all meds tomorrow if stable   Electronically Signed: Robet Leu, PA-C 03/05/2023, 3:23 PM   I spent a total of 15 Minutes at the the patient's bedside AND on the patient's hospital floor or unit, greater than 50% of which was  counseling/coordinating care for L SCA angioplasty/stent

## 2023-03-05 NOTE — Anesthesia Procedure Notes (Signed)
Procedure Name: Intubation Date/Time: 03/05/2023 9:06 AM  Performed by: Georgianne Fick D, CRNAPre-anesthesia Checklist: Patient identified, Emergency Drugs available, Suction available and Patient being monitored Patient Re-evaluated:Patient Re-evaluated prior to induction Oxygen Delivery Method: Circle System Utilized Preoxygenation: Pre-oxygenation with 100% oxygen Induction Type: IV induction Ventilation: Mask ventilation without difficulty Laryngoscope Size: 3 and Mac Grade View: Grade I Tube type: Oral Tube size: 7.0 mm Number of attempts: 1 Airway Equipment and Method: Stylet and Oral airway Placement Confirmation: ETT inserted through vocal cords under direct vision, positive ETCO2 and breath sounds checked- equal and bilateral Secured at: 21 cm Tube secured with: Tape Dental Injury: Teeth and Oropharynx as per pre-operative assessment

## 2023-03-05 NOTE — Addendum Note (Signed)
Addendum  created 03/05/23 1313 by Susy Manor, CRNA   Attestation recorded in Intraprocedure, Intraprocedure Attestations filed

## 2023-03-05 NOTE — Sedation Documentation (Signed)
Patient will be under anesthesia care, including all medication and hemodynamic documentation. Foley catheter will be placed by nursing staff. OG placed by anesthesia team.

## 2023-03-05 NOTE — Anesthesia Preprocedure Evaluation (Addendum)
Anesthesia Evaluation  Patient identified by MRN, date of birth, ID band Patient awake    Reviewed: Allergy & Precautions, H&P , NPO status , Patient's Chart, lab work & pertinent test results  Airway Mallampati: II  TM Distance: >3 FB Neck ROM: Full    Dental no notable dental hx.    Pulmonary former smoker   Pulmonary exam normal breath sounds clear to auscultation       Cardiovascular hypertension, Pt. on medications and Pt. on home beta blockers + Peripheral Vascular Disease  Normal cardiovascular exam Rhythm:Regular Rate:Normal     Neuro/Psych TIA negative psych ROS   GI/Hepatic Neg liver ROS,GERD  ,,  Endo/Other  Hypothyroidism    Renal/GU negative Renal ROS  negative genitourinary   Musculoskeletal negative musculoskeletal ROS (+)    Abdominal   Peds negative pediatric ROS (+)  Hematology negative hematology ROS (+)   Anesthesia Other Findings   Reproductive/Obstetrics negative OB ROS                             Anesthesia Physical Anesthesia Plan  ASA: 3  Anesthesia Plan: General   Post-op Pain Management: Minimal or no pain anticipated   Induction: Intravenous  PONV Risk Score and Plan: 3 and Ondansetron, Dexamethasone and Treatment may vary due to age or medical condition  Airway Management Planned: Oral ETT  Additional Equipment:   Intra-op Plan:   Post-operative Plan: Extubation in OR  Informed Consent: I have reviewed the patients History and Physical, chart, labs and discussed the procedure including the risks, benefits and alternatives for the proposed anesthesia with the patient or authorized representative who has indicated his/her understanding and acceptance.     Dental advisory given  Plan Discussed with: CRNA and Surgeon  Anesthesia Plan Comments:        Anesthesia Quick Evaluation

## 2023-03-05 NOTE — Transfer of Care (Signed)
Immediate Anesthesia Transfer of Care Note  Patient: Melissa Love  Procedure(s) Performed: cerebral angioplasty and possible stenosis of left subclavian artery  Patient Location: PACU  Anesthesia Type:General  Level of Consciousness: awake, alert , and oriented  Airway & Oxygen Therapy: Patient Spontanous Breathing and Patient connected to nasal cannula oxygen  Post-op Assessment: Report given to RN and Post -op Vital signs reviewed and stable  Post vital signs: Reviewed and stable  Last Vitals:  Vitals Value Taken Time  BP 131/69 03/05/23 1137  Temp    Pulse 61 03/05/23 1140  Resp 15 03/05/23 1140  SpO2 100 % 03/05/23 1140  Vitals shown include unfiled device data.  Last Pain:  Vitals:   03/05/23 0701  TempSrc: Oral         Complications: No notable events documented.

## 2023-03-06 ENCOUNTER — Encounter (HOSPITAL_COMMUNITY): Payer: Self-pay | Admitting: Interventional Radiology

## 2023-03-06 LAB — BASIC METABOLIC PANEL
Anion gap: 8 (ref 5–15)
BUN: 10 mg/dL (ref 8–23)
CO2: 21 mmol/L — ABNORMAL LOW (ref 22–32)
Calcium: 8.1 mg/dL — ABNORMAL LOW (ref 8.9–10.3)
Chloride: 111 mmol/L (ref 98–111)
Creatinine, Ser: 0.51 mg/dL (ref 0.44–1.00)
GFR, Estimated: >60 mL/min (ref >60)
Glucose, Bld: 117 mg/dL — ABNORMAL HIGH (ref 70–99)
Potassium: 3.8 mmol/L (ref 3.5–5.1)
Sodium: 140 mmol/L (ref 135–145)

## 2023-03-06 LAB — CBC WITH DIFFERENTIAL/PLATELET
Abs Immature Granulocytes: 0.03 10*3/uL (ref 0.00–0.07)
Basophils Absolute: 0 10*3/uL (ref 0.0–0.1)
Basophils Relative: 0 %
Eosinophils Absolute: 0 10*3/uL (ref 0.0–0.5)
Eosinophils Relative: 0 %
HCT: 30 % — ABNORMAL LOW (ref 36.0–46.0)
Hemoglobin: 9.5 g/dL — ABNORMAL LOW (ref 12.0–15.0)
Immature Granulocytes: 0 %
Lymphocytes Relative: 12 %
Lymphs Abs: 0.9 10*3/uL (ref 0.7–4.0)
MCH: 30.8 pg (ref 26.0–34.0)
MCHC: 31.7 g/dL (ref 30.0–36.0)
MCV: 97.4 fL (ref 80.0–100.0)
Monocytes Absolute: 0.4 10*3/uL (ref 0.1–1.0)
Monocytes Relative: 5 %
Neutro Abs: 6.5 10*3/uL (ref 1.7–7.7)
Neutrophils Relative %: 83 %
Platelets: 179 10*3/uL (ref 150–400)
RBC: 3.08 MIL/uL — ABNORMAL LOW (ref 3.87–5.11)
RDW: 14.4 % (ref 11.5–15.5)
WBC: 7.9 10*3/uL (ref 4.0–10.5)
nRBC: 0 % (ref 0.0–0.2)

## 2023-03-06 LAB — POCT ACTIVATED CLOTTING TIME: Activated Clotting Time: 195 s

## 2023-03-06 LAB — MRSA NEXT GEN BY PCR, NASAL: MRSA by PCR Next Gen: DETECTED — AB

## 2023-03-06 MED ORDER — MUPIROCIN 2 % EX OINT
1.0000 | TOPICAL_OINTMENT | Freq: Two times a day (BID) | CUTANEOUS | Status: DC
  Administered 2023-03-06: 1 via NASAL
  Filled 2023-03-06: qty 22

## 2023-03-06 NOTE — Discharge Summary (Signed)
Patient ID: Melissa Love MRN: [id number] DOB/AGE: 85-Sep-1937 85 y.o.  Admit date: 03/05/2023 Discharge date: 03/06/2023  Supervising Physician: Julieanne Cotton  Patient Status: [facility] - In-pt  Admission Diagnoses: Subclavian artery stenosis   Discharge Diagnoses:  Principal Problem:   Subclavian artery stenosis, left [facility])   Discharged Condition: good  Hospital Course:   Melissa Love. Melissa Love, 87 year old female, has a medical history significant for HTN, SVT, CHF, macular degeneration and vertebral artery occlusion (aspirin). In August 2024  she started having issues with balance and coordination. She also developed a posterior headache. She presented to the ED 01/18/23 for evaluation and a CTA head showed stenosis of the proximal left subclavian vein. She was evaluated by Neurology who recommended a diagnostic angiogram. The patient wished to go home and pursue this as an outpatient and she returned to the hospital 02/13/23 for an outpatient angiogram. This study confirmed the SCA stenosis which was found to be 85% stenosed. Dr. Corliss Skains discussed this finding with her and recommended endovascular treatment. The patient was in agreement and she presented 03/05/23 for left SCA angioplasty and stenting. She was already taking aspirin but she was started on Plavix prior to the procedure. The procedure was done under general anesthesia and she had an uneventful overnight in the Neuro ICU.   She was evaluated this morning by Dr. Corliss Skains. She denies pain or discomfort, she has eaten and is without nausea. Foley catheter recently removed - she has not voided. She has not ambulated yet.  Patient ok for discharge home after she has voided and ambulated. She has a caregiver that will stay with her through Sunday. She was advised no driving, bending, stooping or lifting greater than 10 lb for 2 weeks.   She will follow up with Dr. Corliss Skains in approximately 2-3 weeks and a scheduler from our office  will call her with a date/time of her appointment. She will remain on aspirin and Plavix.     Consults: None  Significant Diagnostic Studies: IR ANGIO INTRA EXTRACRAN SEL COM CAROTID INNOMINATE BILAT MOD SED  Result Date: 02/17/2023 CLINICAL DATA:  History of dizziness, gait instability and lightheadedness. History of high-grade left subclavian artery origin stenosis on CT angiogram of the head and neck. EXAM: BILATERAL COMMON CAROTID AND INNOMINATE ANGIOGRAPHY COMPARISON:  CT angiogram of the head and neck of January 18, 2023. MEDICATIONS: Heparin 2000 units IV. No antibiotic was administered within 1 hour of the procedure. ANESTHESIA/SEDATION: Versed 2 mg IV; Fentanyl 50 mcg IV Moderate Sedation Time:  47 minutes The patient was continuously monitored during the procedure by the interventional radiology nurse under my direct supervision. CONTRAST:  Omnipaque 300 65 mL. FLUOROSCOPY TIME:  Fluoroscopy Time: 11 minutes 36 seconds (416 mGy). COMPLICATIONS: None immediate. TECHNIQUE: Informed written consent was obtained from the patient after a thorough discussion of the procedural risks, benefits and alternatives. All questions were addressed. Maximal Sterile Barrier Technique was utilized including caps, mask, sterile gowns, sterile gloves, sterile drape, hand hygiene and skin antiseptic. A timeout was performed prior to the initiation of the procedure. The right forearm to the wrist was prepped and draped in the usual sterile manner. The right radial artery was then identified with ultrasound and its morphology was documented in the radiology PACS system. A dorsal palmar anastomosis was verified to be present. Using ultrasound guidance, a 4/5 French radial sheath was then inserted over an 018 inch micro guidewire. The obturator, and the micro guidewire were removed. Good aspiration obtained from the  side port of the radial sheath. A cocktail of 2.5 mg of verapamil, 200 mcg of nitroglycerin, and 2000 units of  heparin was then infused in diluted form without event. A right radial arteriogram was then performed. Over an 035 inch Roadrunner guidewire, a 5 French Simmons 2 diagnostic catheter was then advanced to the aortic arch region, and selectively positioned in the right vertebral artery, the left common carotid artery, the right common carotid artery, and the left subclavian artery. A wrist band was then applied for hemostasis at the right radial puncture site at the end of the procedure. Distal radial pulse was verified to be present. FINDINGS: The dominant right vertebral artery origin is widely patent. The vessel is seen to opacify to the cranial skull base. Patency is seen of the right vertebrobasilar junction and the right posterior-inferior cerebellar artery. The basilar artery, the posterior cerebral arteries, the superior cerebellar arteries and the anterior-inferior cerebellar arteries opacify into the capillary and venous phases. Transient retrograde opacification of the distal left vertebrobasilar junction is seen from the right vertebral artery injection. The left subclavian arteriogram demonstrates high-grade approximately 75-80% stenosis just distal to its origin. Distal to this a hypoplastic left vertebral artery is seen to hemodynamically slowly opacify to the cranial skull base. The left common carotid arteriogram demonstrates the left external carotid artery and its major branches to be widely patent. The left internal carotid artery at the bulb to the cranial skull base is widely patent. The petrous, the cavernous and the supraclinoid left ICA are widely patent. The left middle and left anterior cerebral artery opacify into the capillary and venous phases. The venous phase demonstrates a prominent high-riding left jugular bulb. Also demonstrated is a hypoplastic right transverse sinus. The right common carotid arteriogram demonstrates the right external carotid artery and its major branches to be  widely patent. The right internal carotid artery at the bulb to the cranial skull base is widely patent. The petrous, the cavernous and the supraclinoid right ICA are widely patent. The right middle cerebral artery and the right anterior cerebral artery opacify into the capillary and venous phases. Again seen is the prominent high-riding jugular bulb on the left side, and hypoplastic right transverse sinus in relation to the right sigmoid sinus. Also demonstrated is transient cross-filling via the anterior communicating artery of the left anterior cerebral artery A2 segment and distally. IMPRESSION: High-grade approximately 75-80% stenosis of the proximal left subclavian artery as described. High-riding left internal jugular bulb. PLAN: Findings reviewed with the patient, and the patient's son. Options reviewed were those of continued medical management with dual antiplatelets, and also the importance of secondary stroke prevention measures such as treatment of hypertension, and hyperlipidemia, with periodic follow-up imaging studies such as CTA. The other option was endovascular revascularization of the probable symptomatic proximal left subclavian artery stenosis with stent assisted angioplasty under MAC anesthesia, or general anesthesia. The procedure was reviewed in detail with the patient and also the patient's son. The patient has opted for endovascular revascularization of the proximal left subclavian artery. This will be scheduled as soon as possible. Patient advised to maintain adequate hydration. Patient advised to call for any concerns or questions. Electronically Signed   By: Julieanne Cotton M.D.   On: 02/17/2023 07:59   IR ANGIO VERTEBRAL SEL VERTEBRAL UNI R MOD SED  Result Date: 02/17/2023 CLINICAL DATA:  History of dizziness, gait instability and lightheadedness. History of high-grade left subclavian artery origin stenosis on CT angiogram of the head and  neck. EXAM: BILATERAL COMMON CAROTID AND  INNOMINATE ANGIOGRAPHY COMPARISON:  CT angiogram of the head and neck of January 18, 2023. MEDICATIONS: Heparin 2000 units IV. No antibiotic was administered within 1 hour of the procedure. ANESTHESIA/SEDATION: Versed 2 mg IV; Fentanyl 50 mcg IV Moderate Sedation Time:  47 minutes The patient was continuously monitored during the procedure by the interventional radiology nurse under my direct supervision. CONTRAST:  Omnipaque 300 65 mL. FLUOROSCOPY TIME:  Fluoroscopy Time: 11 minutes 36 seconds (416 mGy). COMPLICATIONS: None immediate. TECHNIQUE: Informed written consent was obtained from the patient after a thorough discussion of the procedural risks, benefits and alternatives. All questions were addressed. Maximal Sterile Barrier Technique was utilized including caps, mask, sterile gowns, sterile gloves, sterile drape, hand hygiene and skin antiseptic. A timeout was performed prior to the initiation of the procedure. The right forearm to the wrist was prepped and draped in the usual sterile manner. The right radial artery was then identified with ultrasound and its morphology was documented in the radiology PACS system. A dorsal palmar anastomosis was verified to be present. Using ultrasound guidance, a 4/5 French radial sheath was then inserted over an 018 inch micro guidewire. The obturator, and the micro guidewire were removed. Good aspiration obtained from the side port of the radial sheath. A cocktail of 2.5 mg of verapamil, 200 mcg of nitroglycerin, and 2000 units of heparin was then infused in diluted form without event. A right radial arteriogram was then performed. Over an 035 inch Roadrunner guidewire, a 5 French Simmons 2 diagnostic catheter was then advanced to the aortic arch region, and selectively positioned in the right vertebral artery, the left common carotid artery, the right common carotid artery, and the left subclavian artery. A wrist band was then applied for hemostasis at the right radial  puncture site at the end of the procedure. Distal radial pulse was verified to be present. FINDINGS: The dominant right vertebral artery origin is widely patent. The vessel is seen to opacify to the cranial skull base. Patency is seen of the right vertebrobasilar junction and the right posterior-inferior cerebellar artery. The basilar artery, the posterior cerebral arteries, the superior cerebellar arteries and the anterior-inferior cerebellar arteries opacify into the capillary and venous phases. Transient retrograde opacification of the distal left vertebrobasilar junction is seen from the right vertebral artery injection. The left subclavian arteriogram demonstrates high-grade approximately 75-80% stenosis just distal to its origin. Distal to this a hypoplastic left vertebral artery is seen to hemodynamically slowly opacify to the cranial skull base. The left common carotid arteriogram demonstrates the left external carotid artery and its major branches to be widely patent. The left internal carotid artery at the bulb to the cranial skull base is widely patent. The petrous, the cavernous and the supraclinoid left ICA are widely patent. The left middle and left anterior cerebral artery opacify into the capillary and venous phases. The venous phase demonstrates a prominent high-riding left jugular bulb. Also demonstrated is a hypoplastic right transverse sinus. The right common carotid arteriogram demonstrates the right external carotid artery and its major branches to be widely patent. The right internal carotid artery at the bulb to the cranial skull base is widely patent. The petrous, the cavernous and the supraclinoid right ICA are widely patent. The right middle cerebral artery and the right anterior cerebral artery opacify into the capillary and venous phases. Again seen is the prominent high-riding jugular bulb on the left side, and hypoplastic right transverse sinus in relation to  the right sigmoid sinus.  Also demonstrated is transient cross-filling via the anterior communicating artery of the left anterior cerebral artery A2 segment and distally. IMPRESSION: High-grade approximately 75-80% stenosis of the proximal left subclavian artery as described. High-riding left internal jugular bulb. PLAN: Findings reviewed with the patient, and the patient's son. Options reviewed were those of continued medical management with dual antiplatelets, and also the importance of secondary stroke prevention measures such as treatment of hypertension, and hyperlipidemia, with periodic follow-up imaging studies such as CTA. The other option was endovascular revascularization of the probable symptomatic proximal left subclavian artery stenosis with stent assisted angioplasty under MAC anesthesia, or general anesthesia. The procedure was reviewed in detail with the patient and also the patient's son. The patient has opted for endovascular revascularization of the proximal left subclavian artery. This will be scheduled as soon as possible. Patient advised to maintain adequate hydration. Patient advised to call for any concerns or questions. Electronically Signed   By: Julieanne Cotton M.D.   On: 02/17/2023 07:59   IR US Guide Vasc Access Right  Result Date: 02/17/2023 CLINICAL DATA:  History of dizziness, gait instability and lightheadedness. History of high-grade left subclavian artery origin stenosis on CT angiogram of the head and neck. EXAM: BILATERAL COMMON CAROTID AND INNOMINATE ANGIOGRAPHY COMPARISON:  CT angiogram of the head and neck of January 18, 2023. MEDICATIONS: Heparin 2000 units IV. No antibiotic was administered within 1 hour of the procedure. ANESTHESIA/SEDATION: Versed 2 mg IV; Fentanyl 50 mcg IV Moderate Sedation Time:  47 minutes The patient was continuously monitored during the procedure by the interventional radiology nurse under my direct supervision. CONTRAST:  Omnipaque 300 65 mL. FLUOROSCOPY TIME:   Fluoroscopy Time: 11 minutes 36 seconds (416 mGy). COMPLICATIONS: None immediate. TECHNIQUE: Informed written consent was obtained from the patient after a thorough discussion of the procedural risks, benefits and alternatives. All questions were addressed. Maximal Sterile Barrier Technique was utilized including caps, mask, sterile gowns, sterile gloves, sterile drape, hand hygiene and skin antiseptic. A timeout was performed prior to the initiation of the procedure. The right forearm to the wrist was prepped and draped in the usual sterile manner. The right radial artery was then identified with ultrasound and its morphology was documented in the radiology PACS system. A dorsal palmar anastomosis was verified to be present. Using ultrasound guidance, a 4/5 French radial sheath was then inserted over an 018 inch micro guidewire. The obturator, and the micro guidewire were removed. Good aspiration obtained from the side port of the radial sheath. A cocktail of 2.5 mg of verapamil, 200 mcg of nitroglycerin, and 2000 units of heparin was then infused in diluted form without event. A right radial arteriogram was then performed. Over an 035 inch Roadrunner guidewire, a 5 French Simmons 2 diagnostic catheter was then advanced to the aortic arch region, and selectively positioned in the right vertebral artery, the left common carotid artery, the right common carotid artery, and the left subclavian artery. A wrist band was then applied for hemostasis at the right radial puncture site at the end of the procedure. Distal radial pulse was verified to be present. FINDINGS: The dominant right vertebral artery origin is widely patent. The vessel is seen to opacify to the cranial skull base. Patency is seen of the right vertebrobasilar junction and the right posterior-inferior cerebellar artery. The basilar artery, the posterior cerebral arteries, the superior cerebellar arteries and the anterior-inferior cerebellar arteries  opacify into the capillary and venous phases. Transient  retrograde opacification of the distal left vertebrobasilar junction is seen from the right vertebral artery injection. The left subclavian arteriogram demonstrates high-grade approximately 75-80% stenosis just distal to its origin. Distal to this a hypoplastic left vertebral artery is seen to hemodynamically slowly opacify to the cranial skull base. The left common carotid arteriogram demonstrates the left external carotid artery and its major branches to be widely patent. The left internal carotid artery at the bulb to the cranial skull base is widely patent. The petrous, the cavernous and the supraclinoid left ICA are widely patent. The left middle and left anterior cerebral artery opacify into the capillary and venous phases. The venous phase demonstrates a prominent high-riding left jugular bulb. Also demonstrated is a hypoplastic right transverse sinus. The right common carotid arteriogram demonstrates the right external carotid artery and its major branches to be widely patent. The right internal carotid artery at the bulb to the cranial skull base is widely patent. The petrous, the cavernous and the supraclinoid right ICA are widely patent. The right middle cerebral artery and the right anterior cerebral artery opacify into the capillary and venous phases. Again seen is the prominent high-riding jugular bulb on the left side, and hypoplastic right transverse sinus in relation to the right sigmoid sinus. Also demonstrated is transient cross-filling via the anterior communicating artery of the left anterior cerebral artery A2 segment and distally. IMPRESSION: High-grade approximately 75-80% stenosis of the proximal left subclavian artery as described. High-riding left internal jugular bulb. PLAN: Findings reviewed with the patient, and the patient's son. Options reviewed were those of continued medical management with dual antiplatelets, and also the  importance of secondary stroke prevention measures such as treatment of hypertension, and hyperlipidemia, with periodic follow-up imaging studies such as CTA. The other option was endovascular revascularization of the probable symptomatic proximal left subclavian artery stenosis with stent assisted angioplasty under MAC anesthesia, or general anesthesia. The procedure was reviewed in detail with the patient and also the patient's son. The patient has opted for endovascular revascularization of the proximal left subclavian artery. This will be scheduled as soon as possible. Patient advised to maintain adequate hydration. Patient advised to call for any concerns or questions. Electronically Signed   By: Julieanne Cotton M.D.   On: 02/17/2023 07:59    Treatments: Observation; supportive care  Discharge Exam: Blood pressure 107/60, pulse 79, temperature 97.8 F (36.6 C), temperature source Axillary, resp. rate 14, height 5\' 2"  (1.575 m), weight 160 lb (72.6 kg), SpO2 97%.  Physical Exam Constitutional:      General: She is not in acute distress.    Appearance: She is not ill-appearing.  Cardiovascular:     Comments: Right groin site is soft but tender to palpation. No drainage observed. Ecchymosis. Right arterial line in place. Site is unremarkable.   Pulmonary:     Effort: Pulmonary effort is normal.  Skin:    General: Skin is warm and dry.     Comments: Small petechiae around both eyes. Possibly due to irritation from the tape placed over patient's eyes by Anesthesia during the procedure.   Neurological:     Mental Status: She is alert and oriented to person, place, and time.     Cranial Nerves: No cranial nerve deficit or facial asymmetry.     Motor: No weakness.  Psychiatric:        Mood and Affect: Mood normal.        Behavior: Behavior normal.  Thought Content: Thought content normal.        Judgment: Judgment normal.     Disposition: Discharge disposition: 01-Home or Self  Care   Allergies as of 03/06/2023       Reactions   Hiprex [methenamine] Rash, Other (See Comments)   Fever, Chills   Morphine Swelling   Low bp    Penicillins Rash   Sulfa Antibiotics Rash   Sulfamethoxazole-trimethoprim Nausea Only, Rash        Medication List     TAKE these medications    acetaminophen 650 MG CR tablet Commonly known as: TYLENOL Take 500-650 mg by mouth 2 (two) times daily.   amLODipine 5 MG tablet Commonly known as: NORVASC Take 1 tablet (5 mg total) by mouth daily.   Aspirin Low Dose 81 MG tablet Generic drug: aspirin EC Take 1 tablet (81 mg total) by mouth daily. Swallow whole.   calcium carbonate 500 MG chewable tablet Commonly known as: TUMS - dosed in mg elemental calcium Chew 1 tablet by mouth 2 (two) times a week.   cephALEXin 250 MG capsule Commonly known as: KEFLEX Take 250 mg by mouth at bedtime.   cloNIDine 0.1 MG tablet Commonly known as: Catapres Take 1 tablet (0.1 mg total) by mouth daily as needed (Systolic 180 or above and or Diastolic 100 or above).   clopidogrel 75 MG tablet Commonly known as: Plavix Take 1 tablet (75 mg total) by mouth daily.   CoQ10 100 MG Caps Take 100 mg by mouth daily.   Culturelle Digestive Daily Caps Take 1 capsule by mouth daily.   fexofenadine 180 MG tablet Commonly known as: ALLEGRA Take 180 mg by mouth daily.   fluticasone 50 MCG/ACT nasal spray Commonly known as: FLONASE Place 2 sprays into both nostrils 2 (two) times daily as needed for allergies or rhinitis.   gabapentin 100 MG capsule Commonly known as: NEURONTIN Take 100 mg by mouth 2 (two) times daily.   levothyroxine 25 MCG tablet Commonly known as: SYNTHROID Take 25 mcg by mouth daily before breakfast.   lidocaine 5 % Commonly known as: LIDODERM Place 1 patch onto the skin daily as needed (Pain). Remove & Discard patch within 12 hours or as directed by MD   LORazepam 0.5 MG tablet Commonly known as: ATIVAN Take 0.5  mg by mouth at bedtime. Additional if needed during the day   losartan 50 MG tablet Commonly known as: COZAAR Take 1 tablet (50 mg total) by mouth daily. What changed: when to take this   Lucentis 0.5 MG/0.05ML Sosy Generic drug: Ranibizumab by Intravitreal route See admin instructions. Every two to three months   Magnesium 200 MG Tabs Take 200 mg by mouth daily as needed (Muscle cramps).   metoprolol succinate 25 MG 24 hr tablet Commonly known as: Toprol XL Take 1 tablet (25 mg total) by mouth daily.   ondansetron 4 MG tablet Commonly known as: ZOFRAN Take 4 mg by mouth every 8 (eight) hours as needed for nausea.   PreserVision AREDS 2 Caps Take 1 capsule by mouth daily.   CENTRUM SILVER PO Take 1 tablet by mouth 2 (two) times a week.   sertraline 50 MG tablet Commonly known as: ZOLOFT Take 50 mg by mouth at bedtime.   simvastatin 40 MG tablet Commonly known as: ZOCOR Take 1 tablet (40 mg total) by mouth daily at 6 PM.   SYSTANE FREE OP Place 1 drop into both eyes as needed (Dry eye).  Electronically Signed: Mickie Kay, NP 03/06/2023, 8:57 AM   I have spent Less Than 30 Minutes discharging SHERRIL ZABLOCKI.

## 2023-03-06 NOTE — Progress Notes (Signed)
Patient noted to have petechiae under both eyes this morning. This was attributed to possible skin irritation from the tape used over the eyes by Anesthesia.   During the discharge process patient was also noted to have petechiae on her upper back. This was assessed by Dr. Corliss Skains. Dr. Corliss Skains was unsure what could be the cause of the petechiae as her labs and vitals are all within acceptable limits. Platelet count is normal. They could be from laying on the table during the procedure or possibly due to a heat rash. The areas are all non-tender and they aren't causing any discomfort to the patient.  Dr. Corliss Skains encouraged the patient to follow up with her PCP next week. He will see her back for follow up in 2-3 weeks.  Patient ok for discharge home.  Alwyn Ren, [place] [phone] 03/06/2023, 1:57 PM

## 2023-03-21 ENCOUNTER — Ambulatory Visit (HOSPITAL_COMMUNITY)
Admission: RE | Admit: 2023-03-21 | Discharge: 2023-03-21 | Disposition: A | Discharge status: Home / Self Care | Payer: Medicare Other | Source: Ambulatory Visit | Attending: Student | Admitting: Student

## 2023-03-21 ENCOUNTER — Other Ambulatory Visit (HOSPITAL_COMMUNITY): Payer: Self-pay | Admitting: Radiology

## 2023-03-21 ENCOUNTER — Other Ambulatory Visit: Payer: Self-pay | Admitting: Student

## 2023-03-21 DIAGNOSIS — I771 Stricture of artery: Secondary | ICD-10-CM

## 2023-03-21 DIAGNOSIS — M79605 Pain in left leg: Secondary | ICD-10-CM | POA: Diagnosis not present

## 2023-03-21 DIAGNOSIS — I70208 Unspecified atherosclerosis of native arteries of extremities, other extremity: Secondary | ICD-10-CM | POA: Diagnosis not present

## 2023-03-21 DIAGNOSIS — M79604 Pain in right leg: Secondary | ICD-10-CM | POA: Insufficient documentation

## 2023-03-23 HISTORY — PX: IR RADIOLOGIST EVAL & MGMT: IMG5224

## 2023-03-25 ENCOUNTER — Inpatient Hospital Stay: Payer: Medicare Other | Admitting: Family Medicine

## 2023-03-28 LAB — PLATELET INHIBITION P2Y12

## 2023-04-01 ENCOUNTER — Telehealth: Payer: Self-pay | Admitting: Student

## 2023-04-01 MED ORDER — CLOPIDOGREL BISULFATE 75 MG PO TABS: 37.5000 mg | ORAL_TABLET | Freq: Every day | ORAL | 2 refills | Status: AC

## 2023-04-01 NOTE — Telephone Encounter (Signed)
Patient with 2py12 result of 29. Discussed with Dr. Corliss Skains and patient's dose of Plavix changed to 37.5 mg daily due to concerns of easy bruising. Patient was called and notified of change and in agreement with plan. Patient with understanding to call [facility] Radiology with any questions or concerns. Kennieth Francois, PA-C 04/01/2023 2:54 PM

## 2023-04-04 ENCOUNTER — Other Ambulatory Visit: Payer: Self-pay

## 2023-04-04 MED ORDER — AMLODIPINE BESYLATE 5 MG PO TABS: 5.0000 mg | ORAL_TABLET | Freq: Every day | ORAL | 3 refills | Status: DC

## 2023-05-08 DIAGNOSIS — H353221 Exudative age-related macular degeneration, left eye, with active choroidal neovascularization: Secondary | ICD-10-CM | POA: Diagnosis not present

## 2023-05-14 DIAGNOSIS — R350 Frequency of micturition: Secondary | ICD-10-CM | POA: Diagnosis not present

## 2023-05-14 DIAGNOSIS — R8290 Unspecified abnormal findings in urine: Secondary | ICD-10-CM | POA: Diagnosis not present

## 2023-05-14 DIAGNOSIS — Z87440 Personal history of urinary (tract) infections: Secondary | ICD-10-CM | POA: Diagnosis not present

## 2023-05-20 DIAGNOSIS — G8929 Other chronic pain: Secondary | ICD-10-CM | POA: Diagnosis not present

## 2023-05-20 DIAGNOSIS — M25512 Pain in left shoulder: Secondary | ICD-10-CM | POA: Diagnosis not present

## 2023-06-02 ENCOUNTER — Encounter (HOSPITAL_BASED_OUTPATIENT_CLINIC_OR_DEPARTMENT_OTHER): Payer: Self-pay | Admitting: Emergency Medicine

## 2023-06-02 ENCOUNTER — Emergency Department (HOSPITAL_BASED_OUTPATIENT_CLINIC_OR_DEPARTMENT_OTHER): Payer: Medicare Other

## 2023-06-02 ENCOUNTER — Other Ambulatory Visit: Payer: Self-pay

## 2023-06-02 ENCOUNTER — Other Ambulatory Visit (HOSPITAL_BASED_OUTPATIENT_CLINIC_OR_DEPARTMENT_OTHER): Payer: Self-pay

## 2023-06-02 ENCOUNTER — Emergency Department (HOSPITAL_BASED_OUTPATIENT_CLINIC_OR_DEPARTMENT_OTHER)
Admission: EM | Admit: 2023-06-02 | Discharge: 2023-06-02 | Disposition: A | Discharge status: Home / Self Care | Payer: Medicare Other | Attending: Emergency Medicine | Admitting: Emergency Medicine

## 2023-06-02 DIAGNOSIS — D3502 Benign neoplasm of left adrenal gland: Secondary | ICD-10-CM | POA: Insufficient documentation

## 2023-06-02 DIAGNOSIS — K429 Umbilical hernia without obstruction or gangrene: Secondary | ICD-10-CM | POA: Diagnosis not present

## 2023-06-02 DIAGNOSIS — K5730 Diverticulosis of large intestine without perforation or abscess without bleeding: Secondary | ICD-10-CM | POA: Insufficient documentation

## 2023-06-02 DIAGNOSIS — M48061 Spinal stenosis, lumbar region without neurogenic claudication: Secondary | ICD-10-CM | POA: Diagnosis not present

## 2023-06-02 DIAGNOSIS — M51369 Other intervertebral disc degeneration, lumbar region without mention of lumbar back pain or lower extremity pain: Secondary | ICD-10-CM | POA: Diagnosis not present

## 2023-06-02 DIAGNOSIS — Z7982 Long term (current) use of aspirin: Secondary | ICD-10-CM | POA: Diagnosis not present

## 2023-06-02 DIAGNOSIS — M5416 Radiculopathy, lumbar region: Secondary | ICD-10-CM | POA: Diagnosis not present

## 2023-06-02 DIAGNOSIS — I700 Atherosclerosis of aorta: Secondary | ICD-10-CM | POA: Insufficient documentation

## 2023-06-02 DIAGNOSIS — M5126 Other intervertebral disc displacement, lumbar region: Secondary | ICD-10-CM | POA: Diagnosis not present

## 2023-06-02 DIAGNOSIS — M5410 Radiculopathy, site unspecified: Secondary | ICD-10-CM

## 2023-06-02 DIAGNOSIS — M545 Low back pain, unspecified: Secondary | ICD-10-CM | POA: Diagnosis not present

## 2023-06-02 DIAGNOSIS — D3501 Benign neoplasm of right adrenal gland: Secondary | ICD-10-CM | POA: Insufficient documentation

## 2023-06-02 DIAGNOSIS — M47816 Spondylosis without myelopathy or radiculopathy, lumbar region: Secondary | ICD-10-CM | POA: Diagnosis not present

## 2023-06-02 MED ORDER — KETOROLAC TROMETHAMINE 15 MG/ML IJ SOLN
15.0000 mg | Freq: Once | INTRAMUSCULAR | Status: AC
  Administered 2023-06-02: 15 mg via INTRAMUSCULAR
  Filled 2023-06-02: qty 1

## 2023-06-02 MED ORDER — ACETAMINOPHEN 500 MG PO TABS
1000.0000 mg | ORAL_TABLET | Freq: Once | ORAL | Status: AC
  Administered 2023-06-02: 1000 mg via ORAL
  Filled 2023-06-02: qty 2

## 2023-06-02 MED ORDER — METHYLPREDNISOLONE 4 MG PO TBPK: ORAL_TABLET | ORAL | 0 refills | Status: DC

## 2023-06-02 MED ORDER — METHOCARBAMOL 500 MG PO TABS: 500.0000 mg | ORAL_TABLET | Freq: Two times a day (BID) | ORAL | 0 refills | Status: DC

## 2023-06-02 MED ORDER — DICLOFENAC SODIUM 1 % EX GEL: 4.0000 g | Freq: Four times a day (QID) | CUTANEOUS | 0 refills | Status: DC

## 2023-06-02 MED ORDER — DICLOFENAC SODIUM 1 % EX GEL
4.0000 g | Freq: Four times a day (QID) | CUTANEOUS | 0 refills | Status: DC
  Filled 2023-06-02: qty 100, 7d supply, fill #0

## 2023-06-02 MED ORDER — OXYCODONE HCL 5 MG PO TABS
5.0000 mg | ORAL_TABLET | Freq: Once | ORAL | Status: AC
  Administered 2023-06-02: 5 mg via ORAL
  Filled 2023-06-02 (×2): qty 1

## 2023-06-02 MED ORDER — METHYLPREDNISOLONE 4 MG PO TBPK
ORAL_TABLET | ORAL | 0 refills | Status: DC
  Filled 2023-06-02: qty 21, 6d supply, fill #0

## 2023-06-02 MED ORDER — METHOCARBAMOL 500 MG PO TABS
500.0000 mg | ORAL_TABLET | Freq: Two times a day (BID) | ORAL | 0 refills | Status: DC
  Filled 2023-06-02: qty 10, 5d supply, fill #0

## 2023-06-02 NOTE — ED Triage Notes (Signed)
Pt via pov from home with left hip pain that started Friday night. Pt denies falling or other injury. States she has not had pain like this before. Pt able to bear weight; states it feels better if it is completely still. Pt alert & oriented, nad noted.

## 2023-06-02 NOTE — Discharge Instructions (Signed)
Your CT scan did not show a obvious acute finding inside the belly or in the back.  There were no issues with your hip replacements.  Your back pain is most likely due to a bulging disc.  There is been a lot of research on back pain, unfortunately the only thing that seems to really help is Tylenol and ibuprofen.  Relative rest is also important to not lift greater than 10 pounds bending or twisting at the waist.  Please follow-up with your family physician.  The other thing that really seems to benefit patients is physical therapy which your doctor may send you for.  Please return to the emergency department for new numbness or weakness to your arms or legs. Difficulty with urinating or urinating or pooping on yourself.  Also if you cannot feel toilet paper when you wipe or get a fever.   Use the gel as prescribed.  Take the steroids as prescribed. Also take tylenol 1000mg (2 extra strength) four times a day.

## 2023-06-02 NOTE — ED Provider Notes (Signed)
[place] EMERGENCY DEPARTMENT AT [facility] Provider Note   CSN: [id number] Arrival date & time: 06/02/23  1106     History  Chief Complaint  Patient presents with   Hip Pain    Melissa Love is a 87 y.o. female.  87 yo F with a chief complaints of left-sided low back pain that radiates down the leg to the calf.  This has been going on for about 3 days.  No injury.  No loss of bowel or bladder no loss of perirectal sensation no numbness or weakness to the leg.  She is worried because she had a vascular procedure to have a stent placed.  Pain mostly with bearing weight twisting and turning.  She has been spending a lot of time recumbent because its only way that she feels better.  Has tried Tylenol without improvement.   Hip Pain       Home Medications Prior to Admission medications   Medication Sig Start Date End Date Taking? Authorizing Provider  acetaminophen (TYLENOL) 650 MG CR tablet Take 500-650 mg by mouth 2 (two) times daily.    [provider]  amLODipine (NORVASC) 5 MG tablet Take 1 tablet (5 mg total) by mouth daily. 04/04/23   Baldo Daub, MD  aspirin EC 81 MG tablet Take 1 tablet (81 mg total) by mouth daily. Swallow whole. 07/23/22   Ghimire, Werner Lean, MD  calcium carbonate (TUMS - DOSED IN MG ELEMENTAL CALCIUM) 500 MG chewable tablet Chew 1 tablet by mouth 2 (two) times a week.    [provider]  cephALEXin (KEFLEX) 250 MG capsule Take 250 mg by mouth at bedtime.    [provider]  cloNIDine (CATAPRES) 0.1 MG tablet Take 1 tablet (0.1 mg total) by mouth daily as needed (Systolic 180 or above and or Diastolic 100 or above). Patient not taking: Reported on 02/26/2023 12/11/22   Flossie Dibble, NP  clopidogrel (PLAVIX) 75 MG tablet Take 0.5 tablets (37.5 mg total) by mouth daily. 04/01/23   Kennieth Francois, PA  Coenzyme Q10 (COQ10) 100 MG CAPS Take 100 mg by mouth daily.    [provider]  diclofenac Sodium  (VOLTAREN) 1 % GEL Apply 4 g topically 4 (four) times daily. 06/02/23   Melene Plan, DO  fexofenadine (ALLEGRA) 180 MG tablet Take 180 mg by mouth daily.    [provider]  fluticasone (FLONASE) 50 MCG/ACT nasal spray Place 2 sprays into both nostrils 2 (two) times daily as needed for allergies or rhinitis.    [provider]  gabapentin (NEURONTIN) 100 MG capsule Take 100 mg by mouth 2 (two) times daily.    [provider]  Lactobacillus-Inulin (CULTURELLE DIGESTIVE DAILY) CAPS Take 1 capsule by mouth daily.    [provider]  levothyroxine (SYNTHROID) 25 MCG tablet Take 25 mcg by mouth daily before breakfast.    [provider]  lidocaine (LIDODERM) 5 % Place 1 patch onto the skin daily as needed (Pain). Remove & Discard patch within 12 hours or as directed by MD    [provider]  LORazepam (ATIVAN) 0.5 MG tablet Take 0.5 mg by mouth at bedtime. Additional if needed during the day    [provider]  losartan (COZAAR) 50 MG tablet Take 1 tablet (50 mg total) by mouth daily. Patient taking differently: Take 50 mg by mouth 2 (two) times daily. 02/20/23 05/21/23  Baldo Daub, MD  Magnesium 200 MG TABS Take 200  mg by mouth daily as needed (Muscle cramps).    [provider]  methocarbamol (ROBAXIN) 500 MG tablet Take 1 tablet (500 mg total) by mouth 2 (two) times daily. 06/02/23   Melene Plan, DO  methylPREDNISolone (MEDROL DOSEPAK) 4 MG TBPK tablet Day 1: 8mg  before breakfast, 4 mg after lunch, 4 mg after supper, and 8 mg at bedtime Day 2: 4 mg before breakfast, 4 mg after lunch, 4 mg  after supper, and 8 mg  at bedtime Day 3:  4 mg  before breakfast, 4 mg  after lunch, 4 mg after supper, and 4 mg  at bedtime Day 4: 4 mg  before breakfast, 4 mg  after lunch, and 4 mg at bedtime Day 5: 4 mg  before breakfast and 4 mg at bedtime Day 6: 4 mg  before breakfast 06/02/23   Melene Plan, DO  metoprolol succinate (TOPROL XL) 25 MG 24 hr  tablet Take 1 tablet (25 mg total) by mouth daily. 08/02/22   Baldo Daub, MD  Multiple Vitamins-Minerals (CENTRUM SILVER PO) Take 1 tablet by mouth 2 (two) times a week.    [provider]  Multiple Vitamins-Minerals (PRESERVISION AREDS 2) CAPS Take 1 capsule by mouth daily.    [provider]  ondansetron (ZOFRAN) 4 MG tablet Take 4 mg by mouth every 8 (eight) hours as needed for nausea.    [provider]  Polyethyl Glycol-Propyl Glycol (SYSTANE FREE OP) Place 1 drop into both eyes as needed (Dry eye).    [provider]  Ranibizumab (LUCENTIS) 0.5 MG/0.05ML SOSY by Intravitreal route See admin instructions. Every two to three months    [provider]  sertraline (ZOLOFT) 50 MG tablet Take 50 mg by mouth at bedtime.    [provider]  simvastatin (ZOCOR) 40 MG tablet Take 1 tablet (40 mg total) by mouth daily at 6 PM. 01/19/23   Almon Hercules, MD      Allergies    Hiprex [methenamine], Morphine, Penicillins, Sulfa antibiotics, and Sulfamethoxazole-trimethoprim    Review of Systems   Review of Systems  Physical Exam Updated Vital Signs BP (!) 154/87   Pulse 77   Temp 98.1 F (36.7 C)   Resp 18   Ht 5\' 2"  (1.575 m)   Wt 74.8 kg   SpO2 97%   BMI 30.18 kg/m  Physical Exam Vitals and nursing note reviewed.  Constitutional:      General: She is not in acute distress.    Appearance: She is well-developed. She is not diaphoretic.  HENT:     Head: Normocephalic and atraumatic.  Eyes:     Pupils: Pupils are equal, round, and reactive to light.  Cardiovascular:     Rate and Rhythm: Normal rate and regular rhythm.     Heart sounds: No murmur heard.    No friction rub. No gallop.  Pulmonary:     Effort: Pulmonary effort is normal.     Breath sounds: No wheezing or rales.  Abdominal:     General: There is no distension.     Palpations: Abdomen is soft.     Tenderness: There is no abdominal tenderness.  Musculoskeletal:         General: No tenderness.     Cervical back: Normal range of motion and neck supple.     Comments: Some pain about the left SI joint.  No obvious piriformis tenderness.  Pulse motor and sensation are intact distally.  Reflexes are 2+  and equal.  No clonus.  Skin:    General: Skin is warm and dry.  Neurological:     Mental Status: She is alert and oriented to person, place, and time.  Psychiatric:        Behavior: Behavior normal.     ED Results / Procedures / Treatments   Labs (all labs ordered are listed, but only abnormal results are displayed) Labs Reviewed - No data to display  EKG None  Radiology CT Renal Stone Study Result Date: 06/02/2023 CLINICAL DATA:  Abdominal pain flank pain. EXAM: CT ABDOMEN AND PELVIS WITHOUT CONTRAST TECHNIQUE: Multidetector CT imaging of the abdomen and pelvis was performed following the standard protocol without IV contrast. RADIATION DOSE REDUCTION: This exam was performed according to the departmental dose-optimization program which includes automated exposure control, adjustment of the mA and/or kV according to patient size and/or use of iterative reconstruction technique. COMPARISON:  05/02/2017. FINDINGS: Lower chest: Lung bases are clear. No pericardial or pleural effusions. Hepatobiliary: No focal liver abnormality is seen. No gallstones, gallbladder wall thickening, or biliary dilatation. Pancreas: Unremarkable. No pancreatic ductal dilatation or surrounding inflammatory changes. Spleen: Normal in size without focal abnormality. Adrenals/Urinary Tract: Fat density lesions in both adrenal glands consistent with adenomas measuring 2 cm on the left and 1.8 cm on the right. No nephrolithiasis or hydronephrosis. Unremarkable urinary bladder. Stomach/Bowel: Stomach is within normal limits. Appendix appears normal. No evidence of bowel wall thickening, distention, or inflammatory changes. Diverticula are seen in the descending and sigmoid.  Vascular/Lymphatic: Aortic atherosclerosis. No enlarged abdominal or pelvic lymph nodes. Reproductive: Status post hysterectomy. No adnexal masses. Other: Small anterior abdominal wall periumbilical defect containing a small portion of omental fat. No incarceration or herniated bowel. Musculoskeletal: Thoracolumbosacral degenerative changes. Bilateral hip prostheses. IMPRESSION: 1. Bilateral adrenal adenomas. 2. Diverticulosis. 3. Small periumbilical hernia containing omental fat. 4. Aortic atherosclerosis (ICD10-I70.0). Electronically Signed   By: Layla Maw M.D.   On: 06/02/2023 13:45   CT L-SPINE NO CHARGE Result Date: 06/02/2023 CLINICAL DATA:  Hip pain. EXAM: CT LUMBAR SPINE WITHOUT CONTRAST TECHNIQUE: Multidetector CT imaging of the lumbar spine was performed without intravenous contrast administration. Multiplanar CT image reconstructions were also generated. RADIATION DOSE REDUCTION: This exam was performed according to the departmental dose-optimization program which includes automated exposure control, adjustment of the mA and/or kV according to patient size and/or use of iterative reconstruction technique. COMPARISON:  MRI lumbar spine 10/17/2020. FINDINGS: Segmentation: Conventional numbering is assumed with 5 non-rib-bearing, lumbar type vertebral bodies. Alignment: Normal. Vertebrae: Normal vertebral body heights. No acute fracture or suspicious bone lesion. Degenerative endplate changes at L4-5. Acquired fusion at L5-S1. Paraspinal and other soft tissues: Mild fatty atrophy of the paraspinal muscles. Please refer to same-day abdominal CT report for retroperitoneal findings. Disc levels: T12-L1:  Unremarkable. L1-L2: Disc bulge and moderate bilateral facet arthropathy results in mild spinal canal stenosis, worse from prior. L2-L3: Disc bulge and facet arthropathy results in mild-to-moderate spinal canal stenosis and moderate right neural foraminal narrowing, worse from prior. L3-L4: Disc  bulge with new superimposed left subarticular disc protrusion with vacuum disc phenomenon resulting in severe narrowing of the left lateral recess. Facet arthropathy contributes to moderate spinal canal stenosis and moderate bilateral neural foraminal narrowing. L4-L5: Severe disc height loss with left eccentric endplate osteophytes resulting in moderate narrowing of the left lateral recess and moderate left neural foraminal narrowing. L5-S1:  Unremarkable. IMPRESSION: 1. No acute fracture or traumatic listhesis of the lumbar spine. 2. New left subarticular  disc protrusion at L3-L4 resulting in severe narrowing of the left lateral recess. 3. Mild-to-moderate spinal canal stenosis and moderate right neural foraminal narrowing at L2-L3, worse from prior. 4. Moderate spinal canal stenosis and moderate bilateral neural foraminal narrowing at L3-L4. 5. Moderate narrowing of the left lateral recess and moderate left neural foraminal narrowing at L4-L5. Electronically Signed   By: Orvan Falconer M.D.   On: 06/02/2023 12:58    Procedures Procedures    Medications Ordered in ED Medications  acetaminophen (TYLENOL) tablet 1,000 mg (1,000 mg Oral Given 06/02/23 1226)  ketorolac (TORADOL) 15 MG/ML injection 15 mg (15 mg Intramuscular Given 06/02/23 1227)  oxyCODONE (Oxy IR/ROXICODONE) immediate release tablet 5 mg (5 mg Oral Given 06/02/23 1354)    ED Course/ Medical Decision Making/ A&P                                 Medical Decision Making Amount and/or Complexity of Data Reviewed Radiology: ordered.  Risk OTC drugs. Prescription drug management.   87 yo F with a chief complaints of left-sided low back pain that radiates down the leg.  This has been going on for a few days now.  Atraumatic no concerning symptoms for cauda equina.  As the patient has no history of this and no significant prior imaging will obtain CT of the abdomen pelvis with reformats of the L-spine.  She initially told me that  she had had a endovascular procedure done to that leg.  On my record review this was actually done to the left subclavian.  She has good pulses and I do not think she has an acute arterial occlusion.  CT of the abdomen pelvis without obvious acute intra-abdominal or pelvic pathology.  CT of the L-spine without fracture or cancer metastasis.  Patient does have possible disc bulge at left L3-L4 which fits with her symptoms.  I discussed the results with the patient.  Will have them follow-up with her family doctor in the office.  3:05 PM:  I have discussed the diagnosis/risks/treatment options with the patient and family.  Evaluation and diagnostic testing in the emergency department does not suggest an emergent condition requiring admission or immediate intervention beyond what has been performed at this time.  They will follow up with PCP. We also discussed returning to the ED immediately if new or worsening sx occur. We discussed the sx which are most concerning (e.g., sudden worsening pain, fever, inability to tolerate by mouth) that necessitate immediate return. Medications administered to the patient during their visit and any new prescriptions provided to the patient are listed below.  Medications given during this visit Medications  acetaminophen (TYLENOL) tablet 1,000 mg (1,000 mg Oral Given 06/02/23 1226)  ketorolac (TORADOL) 15 MG/ML injection 15 mg (15 mg Intramuscular Given 06/02/23 1227)  oxyCODONE (Oxy IR/ROXICODONE) immediate release tablet 5 mg (5 mg Oral Given 06/02/23 1354)     The patient appears reasonably screen and/or stabilized for discharge and I doubt any other medical condition or other [facility] requiring further screening, evaluation, or treatment in the ED at this time prior to discharge.          Final Clinical Impression(s) / ED Diagnoses Final diagnoses:  Radicular low back pain    Rx / DC Orders ED Discharge Orders          Ordered    methylPREDNISolone  (MEDROL DOSEPAK) 4 MG TBPK tablet  Status:  Discontinued        06/02/23 1349    diclofenac Sodium (VOLTAREN) 1 % GEL  4 times daily,   Status:  Discontinued        06/02/23 1349    methocarbamol (ROBAXIN) 500 MG tablet  2 times daily,   Status:  Discontinued        06/02/23 1349    diclofenac Sodium (VOLTAREN) 1 % GEL  4 times daily        06/02/23 1408    methocarbamol (ROBAXIN) 500 MG tablet  2 times daily        06/02/23 1408    methylPREDNISolone (MEDROL DOSEPAK) 4 MG TBPK tablet        06/02/23 1408              Melene Plan, DO 06/02/23 1505

## 2023-06-10 DIAGNOSIS — Z6829 Body mass index (BMI) 29.0-29.9, adult: Secondary | ICD-10-CM | POA: Diagnosis not present

## 2023-06-10 DIAGNOSIS — M5416 Radiculopathy, lumbar region: Secondary | ICD-10-CM | POA: Diagnosis not present

## 2023-06-10 DIAGNOSIS — I10 Essential (primary) hypertension: Secondary | ICD-10-CM | POA: Diagnosis not present

## 2023-06-10 DIAGNOSIS — E039 Hypothyroidism, unspecified: Secondary | ICD-10-CM | POA: Diagnosis not present

## 2023-06-10 DIAGNOSIS — D3500 Benign neoplasm of unspecified adrenal gland: Secondary | ICD-10-CM | POA: Diagnosis not present

## 2023-06-17 ENCOUNTER — Ambulatory Visit: Payer: Medicare Other | Admitting: Cardiology

## 2023-06-24 DIAGNOSIS — M48061 Spinal stenosis, lumbar region without neurogenic claudication: Secondary | ICD-10-CM | POA: Diagnosis not present

## 2023-06-27 DIAGNOSIS — M5416 Radiculopathy, lumbar region: Secondary | ICD-10-CM | POA: Diagnosis not present

## 2023-07-03 DIAGNOSIS — Z0000 Encounter for general adult medical examination without abnormal findings: Secondary | ICD-10-CM | POA: Diagnosis not present

## 2023-07-03 DIAGNOSIS — Z9181 History of falling: Secondary | ICD-10-CM | POA: Diagnosis not present

## 2023-07-15 ENCOUNTER — Other Ambulatory Visit: Payer: Self-pay | Admitting: Cardiology

## 2023-07-22 DIAGNOSIS — M48061 Spinal stenosis, lumbar region without neurogenic claudication: Secondary | ICD-10-CM | POA: Diagnosis not present

## 2023-07-22 DIAGNOSIS — M5116 Intervertebral disc disorders with radiculopathy, lumbar region: Secondary | ICD-10-CM | POA: Diagnosis not present

## 2023-08-04 NOTE — Progress Notes (Signed)
 Cardiology Office Note:  .   Date:  08/06/2023  ID:  [name], DOB 09/12/1935, MRN [id number] PCP: Wilmer Floor., MD  [facility] Providers Cardiologist:  Norman Herrlich, MD    History of Present Illness: .   Melissa Love is a 88 y.o. female with a past medical history of hypertension, paroxysmal atrial tachycardia, hyperlipidemia, nodule of thyroid, and subclavian artery stenosis.  09/02/2022 30-day monitor-average heart rate 69 bpm, predominantly sinus rhythm, no atrial fibrillation 07/23/2022 echocardiogram EF 65 to 70%, grade 1 DD  Evaluated by Dr. Dulce Sellar on 11/05/2022, her blood pressure was elevated at 140/64, she was advised to start a blood pressure log.  Evaluated in the ED on 12/08/2022 with complaints of elevated blood pressure readings, accompanied by headache.  Her blood pressure had improved upon its own to 150s systolic, labs were unremarkable.  She was given amlodipine at discharge.  CT of her head was unremarkable. Evaluated 12/11/22 for follow up of her hypertension after her ED visit, had her keep BP log and follow up in 3 months.   Since last evaluated in our office, she has had a stent placed by Dr. Corliss Skains for left subclavian stenosis. She has been doing well, BP has been well controlled at home. She lives independently, offers no formal complaints from a cardiac perspective. She denies chest pain, palpitations, dyspnea, pnd, orthopnea, n, v, dizziness, syncope, edema, weight gain, or early satiety.    ROS: Review of Systems  Constitutional: Negative.   HENT: Negative.    Eyes: Negative.   Respiratory: Negative.    Cardiovascular: Negative.   Gastrointestinal: Negative.   Musculoskeletal: Negative.  Negative for falls.  Skin: Negative.   Neurological:  Positive for headaches.  Endo/Heme/Allergies:  Bruises/bleeds easily.  Psychiatric/Behavioral: Negative.       Studies Reviewed: .        Cardiac Studies & Procedures    ______________________________________________________________________________________________     ECHOCARDIOGRAM  ECHOCARDIOGRAM COMPLETE BUBBLE STUDY 01/19/2023  Narrative ECHOCARDIOGRAM REPORT    Patient Name:   Melissa Love Date of Exam: 01/19/2023 Medical Rec #:  [id number]       Height:       62.0 in Accession #:    [id number]      Weight:       168.6 lb Date of Birth:  03-Jan-1936       BSA:          1.778 m Patient Age:    87 years        BP:           143/67 mmHg Patient Gender: F               HR:           75 bpm. Exam Location:  Inpatient  Procedure: 2D Echo, Color Doppler, Cardiac Doppler and Saline Contrast Bubble Study  Indications:    Stroke I63.9  History:        Patient has prior history of Echocardiogram examinations, most recent 07/23/2022. Risk Factors:Hypertension.  Sonographer:    Harriette Bouillon RDCS Referring Phys: [id number] Melissa Love  IMPRESSIONS   1. Left ventricular ejection fraction, by estimation, is 60 to 65%. The left ventricle has normal function. The left ventricle has no regional wall motion abnormalities. Left ventricular diastolic parameters are consistent with Grade I diastolic dysfunction (impaired relaxation). 2. Right ventricular systolic function is normal. The right ventricular size is normal. 3. The mitral valve is normal in  structure. No evidence of mitral valve regurgitation. No evidence of mitral stenosis. 4. The aortic valve is tricuspid. Aortic valve regurgitation is mild. No aortic stenosis is present. 5. The inferior vena cava is normal in size with greater than 50% respiratory variability, suggesting right atrial pressure of 3 mmHg. 6. Agitated saline contrast bubble study was negative, with no evidence of any interatrial shunt.  FINDINGS Left Ventricle: Left ventricular ejection fraction, by estimation, is 60 to 65%. The left ventricle has normal function. The left ventricle has no regional wall motion abnormalities.  The left ventricular internal cavity size was normal in size. There is no left ventricular hypertrophy. Left ventricular diastolic parameters are consistent with Grade I diastolic dysfunction (impaired relaxation).  Right Ventricle: The right ventricular size is normal. Right ventricular systolic function is normal.  Left Atrium: Left atrial size was normal in size.  Right Atrium: Right atrial size was normal in size.  Pericardium: There is no evidence of pericardial effusion.  Mitral Valve: The mitral valve is normal in structure. No evidence of mitral valve regurgitation. No evidence of mitral valve stenosis.  Tricuspid Valve: The tricuspid valve is normal in structure. Tricuspid valve regurgitation is trivial. No evidence of tricuspid stenosis.  Aortic Valve: The aortic valve is tricuspid. Aortic valve regurgitation is mild. Aortic regurgitation PHT measures 1429 msec. No aortic stenosis is present.  Pulmonic Valve: The pulmonic valve was normal in structure. Pulmonic valve regurgitation is not visualized. No evidence of pulmonic stenosis.  Aorta: The aortic root is normal in size and structure.  Venous: The inferior vena cava is normal in size with greater than 50% respiratory variability, suggesting right atrial pressure of 3 mmHg.  IAS/Shunts: No atrial level shunt detected by color flow Doppler. Agitated saline contrast was given intravenously to evaluate for intracardiac shunting. Agitated saline contrast bubble study was negative, with no evidence of any interatrial shunt.   LEFT VENTRICLE PLAX 2D LVIDd:         4.40 cm   Diastology LVIDs:         3.30 cm   LV e' medial:    6.42 cm/s LV PW:         0.90 cm   LV E/e' medial:  7.2 LV IVS:        0.90 cm   LV e' lateral:   8.92 cm/s LVOT diam:     2.00 cm   LV E/e' lateral: 5.2 LV SV:         49 LV SV Index:   28 LVOT Area:     3.14 cm   RIGHT VENTRICLE             IVC RV S prime:     11.30 cm/s  IVC diam: 1.70 cm TAPSE  (M-mode): 1.8 cm  LEFT ATRIUM           Index        RIGHT ATRIUM          Index LA diam:      3.00 cm 1.69 cm/m   RA Area:     8.27 cm LA Vol (A2C): 26.6 ml 14.96 ml/m  RA Volume:   15.60 ml 8.77 ml/m LA Vol (A4C): 19.0 ml 10.69 ml/m AORTIC VALVE LVOT Vmax:   79.30 cm/s LVOT Vmean:  57.100 cm/s LVOT VTI:    0.157 m AI PHT:      1429 msec  AORTA Ao Root diam: 3.00 cm Ao Asc diam:  3.10 cm  MITRAL VALVE MV Area (PHT): 3.36 cm    SHUNTS MV Decel Time: 226 msec    Systemic VTI:  0.16 m MV E velocity: 46.30 cm/s  Systemic Diam: 2.00 cm MV A velocity: 78.40 cm/s MV E/A ratio:  0.59  Olga Millers MD Electronically signed by Olga Millers MD Signature Date/Time: 01/19/2023/2:12:01 PM    Final    MONITORS  CARDIAC EVENT MONITOR 09/11/2022  Narrative Summary: The patient's monitoring period was 08/02/2022 - 08/31/2022. Baseline sample showed Sinus Rhythm with a heart rate of 69.5 bpm. There were 0 critical, 0 serious, and 3 stable events that occurred. The report analysis of the critical, serious, stable and manually triggered events are listed below. Manually Detected Events: 1 Stable: Sinus Rhythm  Baseline, None or Accidental Push 1 Stable: Sinus Rhythm  None or Accidental Push 1 Stable: Sinus Rhythm  None Reported       ______________________________________________________________________________________________      Risk Assessment/Calculations:             Physical Exam:   VS:  BP 124/68 (BP Location: Right Arm, Patient Position: Sitting, Cuff Size: Normal)   Pulse 68   Ht 5\' 2"  (1.575 m)   Wt 167 lb (75.8 kg)   SpO2 97%   BMI 30.54 kg/m    Wt Readings from Last 3 Encounters:  08/05/23 167 lb (75.8 kg)  06/02/23 165 lb (74.8 kg)  03/05/23 160 lb (72.6 kg)    GEN: Appears younger than stated age, well nourished, well developed in no acute distress NECK: No JVD; No carotid bruits CARDIAC: RRR, no murmurs, rubs, gallops RESPIRATORY:  Clear to  auscultation without rales, wheezing or rhonchi  ABDOMEN: Soft, non-tender, non-distended EXTREMITIES:  No edema; No deformity   ASSESSMENT AND PLAN: .   Hypertension-blood pressure today is well controlled at 124/68, she checks it usually twice throughout the day.  Continue amlodipine 5 mg daily, continue losartan 50 mg in the evening, continue metoprolol 25 mg daily, clonidine PRN for SBP > 180 -- has not needed.    Paroxysmal atrial tachycardia-quiescent, continue metoprolol 25 mg daily  Hyperlipidemia-most recent LDL was well controlled at 69 on 01/19/2023, currently controlled, continue simvastatin 40 mg daily, this appears to be managed by her PCP.  Subclavian artery stenosis - s/p stent, followed by Dr. Corliss Skains, currently on aspirin 81 mg daily.        Dispo: Follow up in 6 months, will need fasting lipids at that time.   Signed, Flossie Dibble, NP

## 2023-08-05 ENCOUNTER — Ambulatory Visit: Payer: Self-pay | Attending: Cardiology | Admitting: Cardiology

## 2023-08-05 ENCOUNTER — Encounter: Payer: Self-pay | Admitting: Cardiology

## 2023-08-05 VITALS — BP 124/68 | HR 68 | Ht 62.0 in | Wt 167.0 lb

## 2023-08-05 DIAGNOSIS — I10 Essential (primary) hypertension: Secondary | ICD-10-CM

## 2023-08-05 DIAGNOSIS — I771 Stricture of artery: Secondary | ICD-10-CM | POA: Diagnosis not present

## 2023-08-05 DIAGNOSIS — E782 Mixed hyperlipidemia: Secondary | ICD-10-CM

## 2023-08-05 NOTE — Patient Instructions (Signed)
 Medication Instructions:  Your physician recommends that you continue on your current medications as directed. Please refer to the Current Medication list given to you today.  *If you need a refill on your cardiac medications before your next appointment, please call your pharmacy*   Lab Work: None Ordered If you have labs (blood work) drawn today and your tests are completely normal, you will receive your results only by: MyChart Message (if you have MyChart) OR A paper copy in the mail If you have any lab test that is abnormal or we need to change your treatment, we will call you to review the results.   Testing/Procedures: None Ordered   Follow-Up: At [facility], you and your health needs are our priority.  As part of our continuing mission to provide you with exceptional heart care, we have created designated Provider Care Teams.  These Care Teams include your primary Cardiologist (physician) and Advanced Practice Providers (APPs -  Physician Assistants and Nurse Practitioners) who all work together to provide you with the care you need, when you need it.  We recommend signing up for the patient portal called "MyChart".  Sign up information is provided on this After Visit Summary.  MyChart is used to connect with patients for Virtual Visits (Telemedicine).  Patients are able to view lab/test results, encounter notes, upcoming appointments, etc.  Non-urgent messages can be sent to your provider as well.   To learn more about what you can do with MyChart, go to [email or web address].    Your next appointment:   6 month(s)  The format for your next appointment:   In Person  Provider:   Wallis Bamberg, NP   Other Instructions NA

## 2023-08-06 ENCOUNTER — Encounter: Payer: Self-pay | Admitting: Cardiology

## 2023-08-08 DIAGNOSIS — M5116 Intervertebral disc disorders with radiculopathy, lumbar region: Secondary | ICD-10-CM | POA: Diagnosis not present

## 2023-08-28 DIAGNOSIS — H353221 Exudative age-related macular degeneration, left eye, with active choroidal neovascularization: Secondary | ICD-10-CM | POA: Diagnosis not present

## 2023-09-02 DIAGNOSIS — M5116 Intervertebral disc disorders with radiculopathy, lumbar region: Secondary | ICD-10-CM | POA: Diagnosis not present

## 2023-09-29 ENCOUNTER — Other Ambulatory Visit: Payer: Self-pay | Admitting: Radiology

## 2023-09-29 DIAGNOSIS — I771 Stricture of artery: Secondary | ICD-10-CM

## 2023-10-01 DIAGNOSIS — M25512 Pain in left shoulder: Secondary | ICD-10-CM | POA: Diagnosis not present

## 2023-10-01 DIAGNOSIS — K6289 Other specified diseases of anus and rectum: Secondary | ICD-10-CM | POA: Diagnosis not present

## 2023-10-01 DIAGNOSIS — H68003 Unspecified Eustachian salpingitis, bilateral: Secondary | ICD-10-CM | POA: Diagnosis not present

## 2023-10-01 DIAGNOSIS — Z6829 Body mass index (BMI) 29.0-29.9, adult: Secondary | ICD-10-CM | POA: Diagnosis not present

## 2023-10-03 ENCOUNTER — Ambulatory Visit (HOSPITAL_COMMUNITY)
Admission: RE | Admit: 2023-10-03 | Discharge: 2023-10-03 | Disposition: A | Discharge status: Home / Self Care | Source: Ambulatory Visit | Attending: Radiology | Admitting: Radiology

## 2023-10-03 ENCOUNTER — Encounter (HOSPITAL_COMMUNITY): Payer: Self-pay

## 2023-10-03 DIAGNOSIS — I6523 Occlusion and stenosis of bilateral carotid arteries: Secondary | ICD-10-CM | POA: Diagnosis not present

## 2023-10-03 DIAGNOSIS — I771 Stricture of artery: Secondary | ICD-10-CM | POA: Diagnosis not present

## 2023-10-03 DIAGNOSIS — I708 Atherosclerosis of other arteries: Secondary | ICD-10-CM | POA: Diagnosis not present

## 2023-10-03 DIAGNOSIS — I700 Atherosclerosis of aorta: Secondary | ICD-10-CM | POA: Diagnosis not present

## 2023-10-03 MED ORDER — SODIUM CHLORIDE (PF) 0.9 % IJ SOLN
INTRAMUSCULAR | Status: AC
Start: 2023-10-03 — End: ?
  Filled 2023-10-03: qty 50

## 2023-10-03 MED ORDER — IOHEXOL 350 MG/ML SOLN
75.0000 mL | Freq: Once | INTRAVENOUS | Status: AC | PRN
  Administered 2023-10-03: 75 mL via INTRAVENOUS

## 2023-10-20 ENCOUNTER — Telehealth (HOSPITAL_COMMUNITY): Payer: Self-pay

## 2023-10-20 NOTE — Telephone Encounter (Signed)
-----   Message from Marilu Shown sent at 10/20/2023  3:50 PM EDT ----- Regarding: RE: follow up Hi!  Dr. Alvira Josephs reviewed her imaging and noted no acute concerns, however he would like her to remain on Plavix  37.5 mg daily for 3 more months due to the high rate of recurrence with a stent in this location. He would also like her to have an US  in 3 months of the carotid and subclavian veins which he will review and discuss possibly discontinuing Plavix  at that time.  Thanks, Cathleen Coach ----- Message ----- From: Faye Hoops Sent: 10/20/2023  10:26 AM EDT To: Marilu Shown, PA-C Subject: follow up                                      Morning Cathleen Coach,   Can you have Dev review her recent cta if you can? The pt's been waiting to find out if she can d/c her plavix .   Thanks,  Merlinda Starling

## 2023-10-20 NOTE — Telephone Encounter (Signed)
 Pt called wanting to know about holding her Plavix  prior to rectal cyst surgery. I called Dr. Britta Candy ([facility]) and spoke to his nurse. The pt is just having an evaluation next week. I gave the nurse my number and fax for after the evaluation to get permission to hold if needed. Called pt back to give her and update and she agreed with this plan. AB

## 2023-10-22 DIAGNOSIS — M5116 Intervertebral disc disorders with radiculopathy, lumbar region: Secondary | ICD-10-CM | POA: Diagnosis not present

## 2023-10-29 ENCOUNTER — Other Ambulatory Visit: Payer: Self-pay

## 2023-10-29 MED ORDER — METOPROLOL SUCCINATE ER 25 MG PO TB24: 25.0000 mg | ORAL_TABLET | Freq: Every day | ORAL | 2 refills | Status: DC

## 2023-10-31 DIAGNOSIS — K6289 Other specified diseases of anus and rectum: Secondary | ICD-10-CM | POA: Diagnosis not present

## 2023-11-19 DIAGNOSIS — R351 Nocturia: Secondary | ICD-10-CM | POA: Diagnosis not present

## 2023-11-19 DIAGNOSIS — Z87440 Personal history of urinary (tract) infections: Secondary | ICD-10-CM | POA: Diagnosis not present

## 2023-11-19 DIAGNOSIS — N3941 Urge incontinence: Secondary | ICD-10-CM | POA: Diagnosis not present

## 2023-12-11 DIAGNOSIS — E663 Overweight: Secondary | ICD-10-CM | POA: Diagnosis not present

## 2023-12-11 DIAGNOSIS — M79661 Pain in right lower leg: Secondary | ICD-10-CM | POA: Diagnosis not present

## 2023-12-11 DIAGNOSIS — M7989 Other specified soft tissue disorders: Secondary | ICD-10-CM | POA: Diagnosis not present

## 2023-12-11 DIAGNOSIS — Z6829 Body mass index (BMI) 29.0-29.9, adult: Secondary | ICD-10-CM | POA: Diagnosis not present

## 2023-12-19 DIAGNOSIS — M1711 Unilateral primary osteoarthritis, right knee: Secondary | ICD-10-CM | POA: Diagnosis not present

## 2023-12-24 DIAGNOSIS — E785 Hyperlipidemia, unspecified: Secondary | ICD-10-CM | POA: Diagnosis not present

## 2023-12-24 DIAGNOSIS — E063 Autoimmune thyroiditis: Secondary | ICD-10-CM | POA: Diagnosis not present

## 2023-12-24 DIAGNOSIS — I10 Essential (primary) hypertension: Secondary | ICD-10-CM | POA: Diagnosis not present

## 2023-12-24 DIAGNOSIS — Z6829 Body mass index (BMI) 29.0-29.9, adult: Secondary | ICD-10-CM | POA: Diagnosis not present

## 2024-01-05 ENCOUNTER — Other Ambulatory Visit: Payer: Self-pay

## 2024-01-05 ENCOUNTER — Telehealth: Payer: Self-pay | Admitting: Cardiology

## 2024-01-05 NOTE — Telephone Encounter (Signed)
 Pt c/o BP issue: STAT if pt c/o blurred vision, one-sided weakness or slurred speech.  1. What is your BP concern?  BP is low.  2. Have you taken any BP medication today? No   3. What are your last 5 BP readings? Ranging around 120/66 before medication and HR is around 75.  4. Are you having any other symptoms (ex. Dizziness, headache, blurred vision, passed out)?  Palpitations before getting up in the morning. Patient says she isn't concerned about it and doesn't think it's afib.

## 2024-01-05 NOTE — Telephone Encounter (Signed)
 Called the patient and she reported that this past Saturday her blood pressure was low so she did not take her blood pressure medication. Her blood pressures are listed below:  Saturday - 120/66 HR 75 Sunday - 135/72 HR 72 Monday - 149/77 HR 72  Patient stated that she has not taken her blood pressure medication since Friday. Since her blood pressures were increasing over the weekend the patient was advised to take her blood pressure medication as prescribed and also to keep a blood pressure log to monitor her blood pressures and drop off the log to the office in 1 week. Patient also stated that when she wakes up in the morning she feels like her heart is a little rapid but after she gets up her heart rate stays in the 70's. Patient verbalized understanding regarding the blood pressure log and had no further questions at this time.

## 2024-01-15 DIAGNOSIS — H353221 Exudative age-related macular degeneration, left eye, with active choroidal neovascularization: Secondary | ICD-10-CM | POA: Diagnosis not present

## 2024-01-20 DIAGNOSIS — K6289 Other specified diseases of anus and rectum: Secondary | ICD-10-CM | POA: Diagnosis not present

## 2024-01-27 DIAGNOSIS — R7401 Elevation of levels of liver transaminase levels: Secondary | ICD-10-CM | POA: Diagnosis not present

## 2024-01-28 ENCOUNTER — Other Ambulatory Visit: Payer: Self-pay

## 2024-01-28 ENCOUNTER — Emergency Department (HOSPITAL_BASED_OUTPATIENT_CLINIC_OR_DEPARTMENT_OTHER)

## 2024-01-28 ENCOUNTER — Encounter (HOSPITAL_BASED_OUTPATIENT_CLINIC_OR_DEPARTMENT_OTHER): Payer: Self-pay

## 2024-01-28 DIAGNOSIS — Z7982 Long term (current) use of aspirin: Secondary | ICD-10-CM | POA: Diagnosis not present

## 2024-01-28 DIAGNOSIS — R519 Headache, unspecified: Secondary | ICD-10-CM | POA: Diagnosis not present

## 2024-01-28 DIAGNOSIS — Z79899 Other long term (current) drug therapy: Secondary | ICD-10-CM | POA: Insufficient documentation

## 2024-01-28 DIAGNOSIS — Z7902 Long term (current) use of antithrombotics/antiplatelets: Secondary | ICD-10-CM | POA: Insufficient documentation

## 2024-01-28 DIAGNOSIS — I10 Essential (primary) hypertension: Secondary | ICD-10-CM | POA: Diagnosis not present

## 2024-01-28 DIAGNOSIS — E039 Hypothyroidism, unspecified: Secondary | ICD-10-CM | POA: Insufficient documentation

## 2024-01-28 DIAGNOSIS — I6782 Cerebral ischemia: Secondary | ICD-10-CM | POA: Diagnosis not present

## 2024-01-28 NOTE — ED Triage Notes (Signed)
 Pt presents via POV c/o left sided head and neck pain that started today. Denies injury. Reports worsening pain with movement. Denies chest pain / SOB. Ambulatory to triage. A&O x4.

## 2024-01-29 ENCOUNTER — Emergency Department (HOSPITAL_BASED_OUTPATIENT_CLINIC_OR_DEPARTMENT_OTHER)
Admission: EM | Admit: 2024-01-29 | Discharge: 2024-01-29 | Disposition: A | Discharge status: Home / Self Care | Attending: Emergency Medicine | Admitting: Emergency Medicine

## 2024-01-29 DIAGNOSIS — R519 Headache, unspecified: Secondary | ICD-10-CM

## 2024-01-29 MED ORDER — CYCLOBENZAPRINE HCL 5 MG PO TABS: 5.0000 mg | ORAL_TABLET | Freq: Two times a day (BID) | ORAL | 0 refills | Status: AC | PRN

## 2024-01-29 MED ORDER — CYCLOBENZAPRINE HCL 5 MG PO TABS
ORAL_TABLET | ORAL | Status: AC
  Filled 2024-01-29: qty 1

## 2024-01-29 NOTE — Discharge Instructions (Signed)
 You were seen today for headache.  This may be related to some occipital neuralgia or muscle spasm at the occiput.  Take medications as prescribed.  Be careful when taking Flexeril  as this can sometimes make you lightheaded or dizzy.

## 2024-01-29 NOTE — ED Provider Notes (Signed)
 [place] EMERGENCY DEPARTMENT AT [facility] Provider Note   CSN: [id number] Arrival date & time: 01/28/24  2323     Patient presents with: Headache   Melissa Love is a 88 y.o. female.   HPI     This is an 88 year old female who presents with headache.  Patient reports that around 6 PM she developed a left-sided occipital headache.  It is worse with movement.  It, radiates down her left neck.  She denies any radiation into the arm or the hand.  Denies weakness, numbness, tingling, strokelike symptoms.  Does not normally get headaches.  She took Tylenol  with minimal relief.  Has not had any vision changes.  Prior to Admission medications   Medication Sig Start Date End Date Taking? Authorizing Provider  cyclobenzaprine  (FLEXERIL ) 5 MG tablet Take 1 tablet (5 mg total) by mouth 2 (two) times daily as needed for muscle spasms. 01/29/24  Yes Myah Guynes, Charmaine FALCON, MD  acetaminophen  (TYLENOL ) 650 MG CR tablet Take 500-650 mg by mouth 2 (two) times daily.    [provider]  amLODipine  (NORVASC ) 5 MG tablet Take 1 tablet (5 mg total) by mouth daily. 04/04/23   Monetta Redell PARAS, MD  aspirin  EC 81 MG tablet Take 1 tablet (81 mg total) by mouth daily. Swallow whole. 07/23/22   Ghimire, Donalda CHRISTELLA, MD  calcium  carbonate (TUMS - DOSED IN MG ELEMENTAL CALCIUM ) 500 MG chewable tablet Chew 1 tablet by mouth 2 (two) times a week.    [provider]  cephALEXin  (KEFLEX ) 250 MG capsule Take 250 mg by mouth at bedtime.    [provider]  cetirizine (ZYRTEC) 10 MG tablet Take 10 mg by mouth daily.    [provider]  clopidogrel  (PLAVIX ) 75 MG tablet Take 0.5 tablets (37.5 mg total) by mouth daily. 04/01/23   Kriste Donnice CROME, PA  Coenzyme Q10 (COQ10) 100 MG CAPS Take 100 mg by mouth daily.    [provider]  fluticasone  (FLONASE ) 50 MCG/ACT nasal spray Place 2 sprays into both nostrils 2 (two) times daily as needed for allergies or rhinitis.     [provider]  gabapentin  (NEURONTIN ) 300 MG capsule Take 300 mg by mouth 3 (three) times daily.    [provider]  Lactobacillus-Inulin (CULTURELLE DIGESTIVE DAILY) CAPS Take 1 capsule by mouth daily.    [provider]  levothyroxine  (SYNTHROID ) 25 MCG tablet Take 25 mcg by mouth daily before breakfast.    [provider]  lidocaine  (LIDODERM ) 5 % Place 1 patch onto the skin daily as needed (Pain). Remove & Discard patch within 12 hours or as directed by MD    [provider]  LORazepam  (ATIVAN ) 0.5 MG tablet Take 0.5 mg by mouth at bedtime. Additional if needed during the day    [provider]  losartan  (COZAAR ) 50 MG tablet Take 1 tablet (50 mg total) by mouth daily. 02/20/23 08/05/23  Monetta Redell PARAS, MD  Magnesium  200 MG TABS Take 200 mg by mouth daily as needed (Muscle cramps).    [provider]  metoprolol  succinate (TOPROL -XL) 25 MG 24 hr tablet Take 1 tablet (25 mg total) by mouth daily. 10/29/23   Carlin Delon BROCKS, NP  Multiple Vitamins-Minerals (CENTRUM SILVER PO) Take 1 tablet by mouth 2 (two) times a week.    [provider]  Multiple Vitamins-Minerals (PRESERVISION AREDS 2) CAPS Take 1 capsule by mouth daily.    [provider]  Polyethyl Glycol-Propyl Glycol (  SYSTANE FREE OP) Place 1 drop into both eyes as needed (Dry eye).    [provider]  Ranibizumab  (LUCENTIS ) 0.5 MG/0.05ML SOSY by Intravitreal route See admin instructions. Every two to three months    [provider]  sertraline  (ZOLOFT ) 50 MG tablet Take 50 mg by mouth at bedtime.    [provider]  simvastatin  (ZOCOR ) 40 MG tablet Take 1 tablet (40 mg total) by mouth daily at 6 PM. 01/19/23   Gonfa, Taye T, MD    Allergies: Hiprex [methenamine], Morphine , Penicillins, Sulfa antibiotics, and Sulfamethoxazole-trimethoprim    Review of Systems  Neurological:  Positive for headaches. Negative for dizziness and  weakness.  All other systems reviewed and are negative.   Updated Vital Signs BP (!) 141/72   Pulse 73   Temp 98.3 F (36.8 C) (Oral)   Resp 18   SpO2 97%   Physical Exam Vitals and nursing note reviewed.  Constitutional:      Appearance: She is well-developed.  HENT:     Head: Normocephalic and atraumatic.     Comments: Tenderness to palpation of left occiput into the left paraspinous musculature of the cervical spine Eyes:     Pupils: Pupils are equal, round, and reactive to light.  Cardiovascular:     Rate and Rhythm: Normal rate and regular rhythm.     Heart sounds: Normal heart sounds.  Pulmonary:     Effort: Pulmonary effort is normal. No respiratory distress.  Abdominal:     Palpations: Abdomen is soft.  Musculoskeletal:     Cervical back: Neck supple.  Skin:    General: Skin is warm and dry.  Neurological:     Mental Status: She is alert and oriented to person, place, and time.     Comments: 5 out of 5 strength in all 4 extremities, no dysmetria to finger-nose-finger, cranial nerves II through XII intact     (all labs ordered are listed, but only abnormal results are displayed) Labs Reviewed - No data to display  EKG: None  Radiology: CT Head Wo Contrast Result Date: 01/28/2024 CLINICAL DATA:  Headache EXAM: CT HEAD WITHOUT CONTRAST TECHNIQUE: Contiguous axial images were obtained from the base of the skull through the vertex without intravenous contrast. RADIATION DOSE REDUCTION: This exam was performed according to the departmental dose-optimization program which includes automated exposure control, adjustment of the mA and/or kV according to patient size and/or use of iterative reconstruction technique. COMPARISON:  CT brain 10/03/2023, MRI 01/18/2023 FINDINGS: Brain: No acute territorial infarction, hemorrhage or intracranial mass. Atrophy and mild chronic small vessel ischemic changes of the white matter. The ventricles are nonenlarged. Vascular: No  hyperdense vessels.  No unexpected calcification Skull: Normal. Negative for fracture or focal lesion. Sinuses/Orbits: No acute finding. Other: None IMPRESSION: 1. No CT evidence for acute intracranial abnormality. 2. Atrophy and mild chronic small vessel ischemic changes of the white matter. Electronically Signed   By: Luke Bun M.D.   On: 01/28/2024 23:48     Procedures   Medications Ordered in the ED  cyclobenzaprine  (FLEXERIL ) 5 MG tablet (  Given 01/29/24 0119)    Clinical Course as of 01/29/24 0226  Thu Jan 29, 2024  0225 Patient states she feels much better.  Feels like she has better range of motion of the neck and decreased pain with range of motion.  She seems to have tolerated Flexeril  well.  Discussed with her that I will send her with a short course at home  but she needs to be careful given side effects and her age.  She can take Tylenol  otherwise. [CH]    Clinical Course User Index [CH] Yulianna Folse, Charmaine FALCON, MD                                 Medical Decision Making Amount and/or Complexity of Data Reviewed Radiology: ordered.  Risk Prescription drug management.   This patient presents to the ED for concern of headache, neck pain, this involves an extensive number of treatment options, and is a complaint that carries with it a high risk of complications and morbidity.  I considered the following differential and admission for this acute, potentially life threatening condition.  The differential diagnosis includes migraine, occipital neuralgia, muscle spasm, intracranial abnormality such as hemorrhage or CVA  MDM:    This is a 88 year old female who presents with headache.  She is overall nontoxic-appearing and vital signs are reassuring.  Patients neurologic exam is normal.  She has very specific point tenderness over the left occiput radiating into the paraspinous muscle region.  She does not tolerate and cannot take NSAIDs.  Says she has tolerated Flexeril  before.  Was  given a dose of Flexeril .  CT head does not show any intracranial abnormality or bleed.  On recheck, she reports significant improvement with the Flexeril .  Could be occipital neuralgia versus muscle spasm.  Regardless, no other red flags and she is neurologically intact.  Will discharge with short course of Flexeril .  Continue Tylenol  as needed.  (Labs, imaging, consults)  Labs: I Ordered, and personally interpreted labs.  The pertinent results include: None  Imaging Studies ordered: I ordered imaging studies including CT head I independently visualized and interpreted imaging. I agree with the radiologist interpretation  Additional history obtained from chart review.  External records from outside source obtained and reviewed including prior evaluations  Cardiac Monitoring: The patient was not maintained on a cardiac monitor.  If on the cardiac monitor, I personally viewed and interpreted the cardiac monitored which showed an underlying rhythm of: N/A  Reevaluation: After the interventions noted above, I reevaluated the patient and found that they have :resolved  Social Determinants of Health:  lives independently  Disposition: Discharge  Co morbidities that complicate the patient evaluation  Past Medical History:  Diagnosis Date   Anxiety    Arthritis    Headache    migraines but none since 15 years   Hypertension    Hypothyroidism    Macular degeneration of left eye    injections every month   Subclavian artery stenosis, left (HCC)      Medicines Meds ordered this encounter  Medications   cyclobenzaprine  (FLEXERIL ) 5 MG tablet    Sebastian, Aryana G: cabinet override   cyclobenzaprine  (FLEXERIL ) 5 MG tablet    Sig: Take 1 tablet (5 mg total) by mouth 2 (two) times daily as needed for muscle spasms.    Dispense:  10 tablet    Refill:  0    I have reviewed the patients home medicines and have made adjustments as needed  Problem List / ED Course: Problem List  Items Addressed This Visit   None Visit Diagnoses       Occipital headache    -  Primary   Relevant Medications   cyclobenzaprine  (FLEXERIL ) 5 MG tablet (Completed)   cyclobenzaprine  (FLEXERIL ) 5 MG tablet  Final diagnoses:  Occipital headache    ED Discharge Orders          Ordered    cyclobenzaprine  (FLEXERIL ) 5 MG tablet  2 times daily PRN        01/29/24 0224               Bari Charmaine FALCON, MD 01/29/24 [phone]

## 2024-02-11 DIAGNOSIS — M48061 Spinal stenosis, lumbar region without neurogenic claudication: Secondary | ICD-10-CM | POA: Insufficient documentation

## 2024-02-11 DIAGNOSIS — M5116 Intervertebral disc disorders with radiculopathy, lumbar region: Secondary | ICD-10-CM | POA: Insufficient documentation

## 2024-02-17 ENCOUNTER — Telehealth (HOSPITAL_COMMUNITY): Payer: Self-pay

## 2024-02-17 NOTE — Telephone Encounter (Signed)
 Pt called to give Dr. Dolphus an update regarding her Plavix . Informed pt that a mychart message was sent out regarding him leaving. I did recommend f/u with one of the new physicians. She would like to just f/u with her PCP or Dr. Ines for right now especially since she hasn't been having any issues. Told pt to give us  a call if something changes or if she needs any help with anything. AB

## 2024-02-28 ENCOUNTER — Other Ambulatory Visit: Payer: Self-pay | Admitting: Cardiology

## 2024-03-02 DIAGNOSIS — L820 Inflamed seborrheic keratosis: Secondary | ICD-10-CM | POA: Diagnosis not present

## 2024-03-02 DIAGNOSIS — D225 Melanocytic nevi of trunk: Secondary | ICD-10-CM | POA: Diagnosis not present

## 2024-03-02 DIAGNOSIS — L578 Other skin changes due to chronic exposure to nonionizing radiation: Secondary | ICD-10-CM | POA: Diagnosis not present

## 2024-03-02 DIAGNOSIS — L821 Other seborrheic keratosis: Secondary | ICD-10-CM | POA: Diagnosis not present

## 2024-03-04 DIAGNOSIS — H6992 Unspecified Eustachian tube disorder, left ear: Secondary | ICD-10-CM | POA: Diagnosis not present

## 2024-03-04 DIAGNOSIS — H9313 Tinnitus, bilateral: Secondary | ICD-10-CM | POA: Diagnosis not present

## 2024-03-04 DIAGNOSIS — Z6830 Body mass index (BMI) 30.0-30.9, adult: Secondary | ICD-10-CM | POA: Diagnosis not present

## 2024-03-09 DIAGNOSIS — G8929 Other chronic pain: Secondary | ICD-10-CM | POA: Diagnosis not present

## 2024-03-09 DIAGNOSIS — M545 Low back pain, unspecified: Secondary | ICD-10-CM | POA: Diagnosis not present

## 2024-03-09 DIAGNOSIS — M5116 Intervertebral disc disorders with radiculopathy, lumbar region: Secondary | ICD-10-CM | POA: Diagnosis not present

## 2024-03-09 DIAGNOSIS — Z6830 Body mass index (BMI) 30.0-30.9, adult: Secondary | ICD-10-CM | POA: Diagnosis not present

## 2024-03-16 ENCOUNTER — Other Ambulatory Visit (HOSPITAL_COMMUNITY): Payer: Self-pay | Admitting: Radiology

## 2024-03-16 DIAGNOSIS — G459 Transient cerebral ischemic attack, unspecified: Secondary | ICD-10-CM

## 2024-03-16 DIAGNOSIS — I7774 Dissection of vertebral artery: Secondary | ICD-10-CM

## 2024-03-16 DIAGNOSIS — I771 Stricture of artery: Secondary | ICD-10-CM

## 2024-03-16 DIAGNOSIS — I6509 Occlusion and stenosis of unspecified vertebral artery: Secondary | ICD-10-CM

## 2024-03-17 ENCOUNTER — Other Ambulatory Visit: Payer: Self-pay

## 2024-03-19 ENCOUNTER — Other Ambulatory Visit: Payer: Self-pay | Admitting: Cardiology

## 2024-03-23 DIAGNOSIS — M5116 Intervertebral disc disorders with radiculopathy, lumbar region: Secondary | ICD-10-CM | POA: Diagnosis not present

## 2024-03-29 ENCOUNTER — Telehealth: Payer: Self-pay | Admitting: Neuroradiology

## 2024-03-29 NOTE — Telephone Encounter (Signed)
 Called pt to reschedule appt. Was unable to reach pt and voicemail wasn't set up.

## 2024-03-31 DIAGNOSIS — E785 Hyperlipidemia, unspecified: Secondary | ICD-10-CM | POA: Diagnosis not present

## 2024-03-31 DIAGNOSIS — I700 Atherosclerosis of aorta: Secondary | ICD-10-CM | POA: Diagnosis not present

## 2024-03-31 DIAGNOSIS — E538 Deficiency of other specified B group vitamins: Secondary | ICD-10-CM | POA: Diagnosis not present

## 2024-03-31 DIAGNOSIS — Z6830 Body mass index (BMI) 30.0-30.9, adult: Secondary | ICD-10-CM | POA: Diagnosis not present

## 2024-03-31 DIAGNOSIS — F411 Generalized anxiety disorder: Secondary | ICD-10-CM | POA: Diagnosis not present

## 2024-03-31 DIAGNOSIS — I479 Paroxysmal tachycardia, unspecified: Secondary | ICD-10-CM | POA: Diagnosis not present

## 2024-03-31 DIAGNOSIS — I10 Essential (primary) hypertension: Secondary | ICD-10-CM | POA: Diagnosis not present

## 2024-03-31 DIAGNOSIS — H9313 Tinnitus, bilateral: Secondary | ICD-10-CM | POA: Diagnosis not present

## 2024-03-31 DIAGNOSIS — D692 Other nonthrombocytopenic purpura: Secondary | ICD-10-CM | POA: Diagnosis not present

## 2024-03-31 DIAGNOSIS — I771 Stricture of artery: Secondary | ICD-10-CM | POA: Diagnosis not present

## 2024-03-31 DIAGNOSIS — E063 Autoimmune thyroiditis: Secondary | ICD-10-CM | POA: Diagnosis not present

## 2024-03-31 DIAGNOSIS — Z23 Encounter for immunization: Secondary | ICD-10-CM | POA: Diagnosis not present

## 2024-04-02 DIAGNOSIS — E063 Autoimmune thyroiditis: Secondary | ICD-10-CM | POA: Diagnosis not present

## 2024-04-02 DIAGNOSIS — I10 Essential (primary) hypertension: Secondary | ICD-10-CM | POA: Diagnosis not present

## 2024-04-02 DIAGNOSIS — E785 Hyperlipidemia, unspecified: Secondary | ICD-10-CM | POA: Diagnosis not present

## 2024-04-13 DIAGNOSIS — J342 Deviated nasal septum: Secondary | ICD-10-CM | POA: Diagnosis not present

## 2024-04-13 DIAGNOSIS — J3089 Other allergic rhinitis: Secondary | ICD-10-CM | POA: Diagnosis not present

## 2024-04-13 DIAGNOSIS — H9319 Tinnitus, unspecified ear: Secondary | ICD-10-CM | POA: Diagnosis not present

## 2024-04-15 ENCOUNTER — Ambulatory Visit: Admitting: Neuroradiology

## 2024-04-19 ENCOUNTER — Ambulatory Visit: Admitting: Neuroradiology

## 2024-04-19 ENCOUNTER — Encounter: Payer: Self-pay | Admitting: Neuroradiology

## 2024-04-19 VITALS — BP 112/65 | HR 75 | Ht 62.0 in | Wt 170.0 lb

## 2024-04-19 DIAGNOSIS — Z95828 Presence of other vascular implants and grafts: Secondary | ICD-10-CM | POA: Diagnosis not present

## 2024-04-19 DIAGNOSIS — I771 Stricture of artery: Secondary | ICD-10-CM | POA: Diagnosis not present

## 2024-04-19 NOTE — Progress Notes (Signed)
 I had the pleasure of seeing Melissa Love in follow-up today.  She is 88 years old.  On 01/18/2023 she had an episode of near syncope and was found to be very hypertensive.  She had a CT scan, CT arteriogram and brain MRI all of which I reviewed in detail.  There was no acute infarct.  She was found to have a 70 to 80% left subclavian artery stenosis, which was stented a couple of months later on 03/05/2023.  She has not had any problems with dizziness or imbalance.  She did have a CT arteriogram on 10/03/2023 and the stent was open.  On her physical exam today, her radial pulses are symmetric.  Her blood pressure is 112/65.  She was on aspirin  and Plavix  for a total of 8 months, and is now on low-dose aspirin .  Assessment:  Left subclavian stent placed 03/05/2023 for left subclavian stenosis.  The stent is patent.  Recommendation:  She does not need any further routine imaging or clinical follow-up for this particular problem.  It is reasonable to continue 81 mg aspirin  given her atherosclerotic vascular disease.  This can be stopped at any time as needed for bleeding problems or medical/dental procedures.  I am glad to see her again at any time as needed.

## 2024-04-22 ENCOUNTER — Ambulatory Visit: Admitting: Neuroradiology

## 2024-07-09 ENCOUNTER — Other Ambulatory Visit: Payer: Self-pay

## 2024-07-09 MED ORDER — METOPROLOL SUCCINATE ER 25 MG PO TB24: 25.0000 mg | ORAL_TABLET | Freq: Every day | ORAL | 0 refills | Status: AC
